# Patient Record
Sex: Male | Born: 1955
Health system: Southern US, Community
[De-identification: ages and names within clinical notes are randomized; demographics above are authoritative.]

## PROBLEM LIST (undated history)

## (undated) DIAGNOSIS — T7840XA Allergy, unspecified, initial encounter: Secondary | ICD-10-CM

## (undated) DIAGNOSIS — E785 Hyperlipidemia, unspecified: Secondary | ICD-10-CM

## (undated) DIAGNOSIS — I10 Essential (primary) hypertension: Secondary | ICD-10-CM

## (undated) DIAGNOSIS — E119 Type 2 diabetes mellitus without complications: Secondary | ICD-10-CM

## (undated) DIAGNOSIS — N529 Male erectile dysfunction, unspecified: Secondary | ICD-10-CM

## (undated) DIAGNOSIS — K219 Gastro-esophageal reflux disease without esophagitis: Secondary | ICD-10-CM

## (undated) DIAGNOSIS — J45909 Unspecified asthma, uncomplicated: Secondary | ICD-10-CM

## (undated) HISTORY — DX: Essential (primary) hypertension: I10

## (undated) HISTORY — PX: COLONOSCOPY: SHX174

## (undated) HISTORY — PX: HERNIA REPAIR: SHX51

## (undated) HISTORY — DX: Unspecified asthma, uncomplicated: J45.909

## (undated) HISTORY — DX: Allergy, unspecified, initial encounter: T78.40XA

## (undated) HISTORY — DX: Type 2 diabetes mellitus without complications: E11.9

## (undated) HISTORY — DX: Male erectile dysfunction, unspecified: N52.9

## (undated) HISTORY — DX: Hyperlipidemia, unspecified: E78.5

## (undated) HISTORY — DX: Gastro-esophageal reflux disease without esophagitis: K21.9

---

## 2009-06-02 ENCOUNTER — Emergency Department: Payer: Self-pay | Admitting: Emergency Medicine

## 2010-03-13 ENCOUNTER — Emergency Department: Payer: Self-pay | Admitting: Emergency Medicine

## 2010-03-20 ENCOUNTER — Emergency Department: Payer: Self-pay | Admitting: Emergency Medicine

## 2010-07-15 ENCOUNTER — Ambulatory Visit: Payer: Self-pay | Admitting: Surgery

## 2011-11-19 ENCOUNTER — Ambulatory Visit: Payer: Self-pay | Admitting: Emergency Medicine

## 2011-11-21 LAB — CBC WITH DIFFERENTIAL/PLATELET
Basophil #: 0 10*3/uL (ref 0.0–0.1)
Basophil %: 0.5 %
Eosinophil #: 0.1 10*3/uL (ref 0.0–0.7)
Eosinophil %: 1.3 %
HCT: 42.8 % (ref 40.0–52.0)
HGB: 14.3 g/dL (ref 13.0–18.0)
Lymphocyte #: 1.4 10*3/uL (ref 1.0–3.6)
Lymphocyte %: 15 %
MCH: 23.5 pg — ABNORMAL LOW (ref 26.0–34.0)
MCHC: 33.5 g/dL (ref 32.0–36.0)
MCV: 70 fL — ABNORMAL LOW (ref 80–100)
Monocyte #: 0.7 x10 3/mm (ref 0.2–1.0)
Monocyte %: 7.4 %
Neutrophil #: 7.1 10*3/uL — ABNORMAL HIGH (ref 1.4–6.5)
Neutrophil %: 75.8 %
Platelet: 173 10*3/uL (ref 150–440)
RBC: 6.09 10*6/uL — ABNORMAL HIGH (ref 4.40–5.90)
RDW: 15.7 % — ABNORMAL HIGH (ref 11.5–14.5)
WBC: 9.3 10*3/uL (ref 3.8–10.6)

## 2011-11-21 LAB — BASIC METABOLIC PANEL
Anion Gap: 8 (ref 7–16)
BUN: 11 mg/dL (ref 7–18)
Calcium, Total: 8.7 mg/dL (ref 8.5–10.1)
Chloride: 103 mmol/L (ref 98–107)
Co2: 26 mmol/L (ref 21–32)
Creatinine: 0.89 mg/dL (ref 0.60–1.30)
EGFR (African American): >60
EGFR (Non-African Amer.): >60
Glucose: 108 mg/dL — ABNORMAL HIGH (ref 65–99)
Osmolality: 274 (ref 275–301)
Potassium: 3.7 mmol/L (ref 3.5–5.1)
Sodium: 137 mmol/L (ref 136–145)

## 2013-05-26 ENCOUNTER — Encounter (INDEPENDENT_AMBULATORY_CARE_PROVIDER_SITE_OTHER): Payer: Self-pay | Admitting: Surgery

## 2013-05-30 ENCOUNTER — Ambulatory Visit (INDEPENDENT_AMBULATORY_CARE_PROVIDER_SITE_OTHER): Payer: Managed Care, Other (non HMO) | Admitting: Surgery

## 2013-05-30 ENCOUNTER — Encounter (INDEPENDENT_AMBULATORY_CARE_PROVIDER_SITE_OTHER): Payer: Self-pay | Admitting: Surgery

## 2013-05-30 VITALS — BP 162/104 | HR 84 | Temp 97.9°F | Resp 16 | Wt 197.4 lb

## 2013-05-30 DIAGNOSIS — K429 Umbilical hernia without obstruction or gangrene: Secondary | ICD-10-CM | POA: Insufficient documentation

## 2013-05-30 NOTE — Progress Notes (Signed)
Patient ID: Justin Frank, male   DOB: May 03, 1955, 58 y.o.   MRN: [id number]  Chief Complaint  Patient presents with  . New Evaluation    eval umb hernia    HPI Justin Frank is a 58 y.o. male.  Referred by Dr. Bernita Buffy for umbilical hernia  HPI This is a 58 year old male who is status post umbilical hernia repair with mesh in 2012. He experienced a recurrence quickly and had surgery again in 2013 at [facility]. We were able to obtain the operative report from 11/09/11. This was performed by Dr. Phylis Bougie who described using a ventralex Mesh. The patient reports that he is having more pain and bulging to the right of his incision.  He denies any obstructive symptoms. The only imaging he has had was an ultrasound which was not helpful. He requested evaluation in [place].   Past Medical History  Diagnosis Date  . Hypertension   . Asthma   . Hyperlipidemia   . ED (erectile dysfunction)     PSH - Umbilical hernia repair with mesh 1914 Umbilical hernia repair with mesh 2013 Shoulder surgery 2009  History reviewed. No pertinent family history.  Social History History  Substance Use Topics  . Smoking status: Former Research scientist (life sciences)  . Smokeless tobacco: Not on file  . Alcohol Use: No    Allergies  Allergen Reactions  . Lipitor [Atorvastatin]   . Lisinopril Cough    Current Outpatient Prescriptions  Medication Sig Dispense Refill  . Azelastine-Fluticasone (DYMISTA) 137-50 MCG/ACT SUSP Place into the nose.      Marland Kitchen azithromycin (ZITHROMAX) 250 MG tablet Take by mouth daily.      . cyclobenzaprine (FLEXERIL) 10 MG tablet Take 10 mg by mouth 3 (three) times daily as needed for muscle spasms.      . diclofenac (VOLTAREN) 75 MG EC tablet Take 75 mg by mouth 2 (two) times daily.      . Diclofenac Potassium 25 MG CAPS Take by mouth.      . levofloxacin (LEVAQUIN) 500 MG tablet Take 500 mg by mouth daily.      . meloxicam (MOBIC) 15 MG tablet Take 15 mg by mouth daily.      .  mometasone (NASONEX) 50 MCG/ACT nasal spray Place 2 sprays into the nose daily.      . mometasone-formoterol (DULERA) 100-5 MCG/ACT AERO Inhale 2 puffs into the lungs 2 (two) times daily.      . pantoprazole (PROTONIX) 40 MG injection Inject 40 mg into the vein.      . pantoprazole (PROTONIX) 40 MG tablet Take 40 mg by mouth daily.      . phentermine (ADIPEX-P) 37.5 MG tablet Take 37.5 mg by mouth daily before breakfast.      . predniSONE (DELTASONE) 10 MG tablet Take 10 mg by mouth daily with breakfast.      . rosuvastatin (CRESTOR) 20 MG tablet Take 20 mg by mouth daily.      . simvastatin (ZOCOR) 20 MG tablet Take 20 mg by mouth daily.      . tadalafil (CIALIS) 5 MG tablet Take 5 mg by mouth daily as needed for erectile dysfunction.       No current facility-administered medications for this visit.    Review of Systems Review of Systems  Constitutional: Negative for fever, chills and unexpected weight change.  HENT: Negative for congestion, hearing loss, sore throat, trouble swallowing and voice change.   Eyes: Negative for visual disturbance.  Respiratory: Negative for  cough and wheezing.   Cardiovascular: Negative for chest pain, palpitations and leg swelling.  Gastrointestinal: Positive for abdominal pain. Negative for nausea, vomiting, diarrhea, constipation, blood in stool, abdominal distention, anal bleeding and rectal pain.  Genitourinary: Negative for hematuria and difficulty urinating.  Musculoskeletal: Negative for arthralgias.  Skin: Negative for rash and wound.  Neurological: Negative for seizures, syncope, weakness and headaches.  Hematological: Negative for adenopathy. Does not bruise/bleed easily.  Psychiatric/Behavioral: Negative for confusion.    Blood pressure 162/104, pulse 84, temperature 97.9 F (36.6 C), temperature source Temporal, resp. rate 16, weight 197 lb 6.4 oz (89.54 kg).  Physical Exam Physical Exam WDWN in NAD HEENT:  EOMI, sclera anicteric Neck:   No masses, no thyromegaly Lungs:  CTA bilaterally; normal respiratory effort CV:  Regular rate and rhythm; no murmurs Abd:  +bowel sounds, soft, healed incision below umbilicus - slight bulge to the right of the incision with Valsalva maneuver Ext:  Well-perfused; no edema Skin:  Warm, dry; no sign of jaundice  Data Reviewed No imaging  Assessment    Recurrent umbilical hernia     Plan    Recommend changing the approach to the hernia repair.  Recommend laparoscopic hernia repair with mesh.  The surgical procedure has been discussed with the patient.  Potential risks, benefits, alternative treatments, and expected outcomes have been explained.  All of the patient's questions at this time have been answered.  The likelihood of reaching the patient's treatment goal is good.  The patient understand the proposed surgical procedure and wishes to proceed.         Shaunessy Dobratz K. 05/30/2013, 10:12 AM

## 2013-06-13 ENCOUNTER — Encounter (INDEPENDENT_AMBULATORY_CARE_PROVIDER_SITE_OTHER): Payer: Self-pay

## 2013-06-22 SURGERY — Surgical Case
Anesthesia: *Unknown

## 2013-06-27 ENCOUNTER — Other Ambulatory Visit (INDEPENDENT_AMBULATORY_CARE_PROVIDER_SITE_OTHER): Payer: Self-pay

## 2013-06-27 ENCOUNTER — Other Ambulatory Visit (INDEPENDENT_AMBULATORY_CARE_PROVIDER_SITE_OTHER): Payer: Self-pay | Admitting: Surgery

## 2013-06-27 DIAGNOSIS — K429 Umbilical hernia without obstruction or gangrene: Secondary | ICD-10-CM

## 2013-06-27 MED ORDER — OXYCODONE-ACETAMINOPHEN 5-325 MG PO TABS: 1.0000 | ORAL_TABLET | Freq: Four times a day (QID) | ORAL | Status: DC | PRN

## 2013-07-14 ENCOUNTER — Ambulatory Visit (INDEPENDENT_AMBULATORY_CARE_PROVIDER_SITE_OTHER): Payer: Managed Care, Other (non HMO) | Admitting: Surgery

## 2013-07-14 ENCOUNTER — Encounter (INDEPENDENT_AMBULATORY_CARE_PROVIDER_SITE_OTHER): Payer: Self-pay | Admitting: Surgery

## 2013-07-14 VITALS — BP 138/88 | HR 94 | Temp 97.4°F | Resp 14 | Ht 64.0 in | Wt 196.4 lb

## 2013-07-14 DIAGNOSIS — K429 Umbilical hernia without obstruction or gangrene: Secondary | ICD-10-CM

## 2013-07-14 MED ORDER — CYCLOBENZAPRINE HCL 5 MG PO TABS: 5.0000 mg | ORAL_TABLET | Freq: Three times a day (TID) | ORAL | Status: DC | PRN

## 2013-07-14 NOTE — Progress Notes (Signed)
Status post laparoscopic repair of a recurrent umbilical hernia with excision of the old mesh on 06/27/13. He is doing reasonably well. He has a lot of soreness around his sides. Most of this is located around his laparoscopic incisions. His umbilical region seems flat with no sign of hernia. All the incisions seem to be healing well with no sign of infection. The Dermabond is coming off. He is complaining of some back pain. He has used Flexeril in the past so I gave him another refill for this.  His job requires a lot of lifting so we will keep him out of work for a full 6 weeks. I gave him a note to return to work on 08/14/13. We will reevaluate him one more time prior to that visit.  Justin Frank. Georgette Dover, MD, [facility]  General/ Trauma Surgery  07/14/2013 10:25 AM

## 2013-08-11 ENCOUNTER — Encounter (INDEPENDENT_AMBULATORY_CARE_PROVIDER_SITE_OTHER): Payer: Self-pay | Admitting: Surgery

## 2013-08-11 ENCOUNTER — Ambulatory Visit (INDEPENDENT_AMBULATORY_CARE_PROVIDER_SITE_OTHER): Payer: Managed Care, Other (non HMO) | Admitting: Surgery

## 2013-08-11 VITALS — BP 120/79 | HR 81 | Temp 98.6°F | Resp 14 | Ht 64.0 in | Wt 199.0 lb

## 2013-08-11 DIAGNOSIS — K429 Umbilical hernia without obstruction or gangrene: Secondary | ICD-10-CM

## 2013-08-11 NOTE — Progress Notes (Signed)
The patient returns for evaluation of his recurrent umbilical hernia repair on 06/27/13. The soreness is improving. The swelling has dissipated. His incision is well-healed with no sign of infection. The laparoscopic port sites are also completely healed. No sign of recurrent hernia. He may return to work at full duty. Followup when necessary.  Imogene Burn. Georgette Dover, MD, [facility]  General/ Trauma Surgery  08/11/2013 11:54 AM

## 2013-10-09 ENCOUNTER — Telehealth (INDEPENDENT_AMBULATORY_CARE_PROVIDER_SITE_OTHER): Payer: Self-pay

## 2013-10-09 NOTE — Telephone Encounter (Signed)
Pt s/p recurrent umbilical hernia repair on 06/27/13 by Dr Georgette Dover. Pt states that he has started having some soreness at his incision site. Pt denies any heavy lifting, pushing, or pulling. Pt denies any fevers, chills, redness or swelling at the incison area. Advised pt to scale back on his daily activities and increase as tolerated. Pt advised that he can alternate Tylenol and Ibuprofen as needed for pain relief. Advised pt that he can place heat/ice as well on the area for pain relief. Pt would like to know if Dr Georgette Dover needs to see him in the office. Informed pt that I would send Dr Georgette Dover a message and we would contact him as soon as we received a message. Pt states that he will not have insurance until Sept 1.

## 2013-10-10 NOTE — Telephone Encounter (Signed)
If he continues to have symptoms after trying ibuprofen, I can see him back in the office.  If he wants to wait until after Sept 1, that would be fine.

## 2013-10-10 NOTE — Telephone Encounter (Signed)
Informed pt of Dr Vonna Kotyk message. Pt states that he will continue to take the Ibuprofen. He states that if symptoms continue or become worse he will call us back.

## 2014-05-02 ENCOUNTER — Emergency Department (HOSPITAL_BASED_OUTPATIENT_CLINIC_OR_DEPARTMENT_OTHER): Payer: Managed Care, Other (non HMO)

## 2014-05-02 ENCOUNTER — Emergency Department (HOSPITAL_BASED_OUTPATIENT_CLINIC_OR_DEPARTMENT_OTHER)
Admission: EM | Admit: 2014-05-02 | Discharge: 2014-05-02 | Disposition: A | Discharge status: Home / Self Care | Payer: Managed Care, Other (non HMO) | Attending: Emergency Medicine | Admitting: Emergency Medicine

## 2014-05-02 ENCOUNTER — Encounter (HOSPITAL_BASED_OUTPATIENT_CLINIC_OR_DEPARTMENT_OTHER): Payer: Self-pay | Admitting: *Deleted

## 2014-05-02 DIAGNOSIS — N529 Male erectile dysfunction, unspecified: Secondary | ICD-10-CM | POA: Insufficient documentation

## 2014-05-02 DIAGNOSIS — R079 Chest pain, unspecified: Secondary | ICD-10-CM

## 2014-05-02 DIAGNOSIS — R0981 Nasal congestion: Secondary | ICD-10-CM | POA: Diagnosis not present

## 2014-05-02 DIAGNOSIS — Z791 Long term (current) use of non-steroidal anti-inflammatories (NSAID): Secondary | ICD-10-CM | POA: Insufficient documentation

## 2014-05-02 DIAGNOSIS — R05 Cough: Secondary | ICD-10-CM | POA: Diagnosis not present

## 2014-05-02 DIAGNOSIS — I10 Essential (primary) hypertension: Secondary | ICD-10-CM | POA: Diagnosis not present

## 2014-05-02 DIAGNOSIS — Z79899 Other long term (current) drug therapy: Secondary | ICD-10-CM | POA: Insufficient documentation

## 2014-05-02 DIAGNOSIS — E785 Hyperlipidemia, unspecified: Secondary | ICD-10-CM | POA: Diagnosis not present

## 2014-05-02 DIAGNOSIS — Z7952 Long term (current) use of systemic steroids: Secondary | ICD-10-CM | POA: Diagnosis not present

## 2014-05-02 DIAGNOSIS — Z87891 Personal history of nicotine dependence: Secondary | ICD-10-CM | POA: Insufficient documentation

## 2014-05-02 DIAGNOSIS — J45909 Unspecified asthma, uncomplicated: Secondary | ICD-10-CM | POA: Diagnosis not present

## 2014-05-02 DIAGNOSIS — Z7951 Long term (current) use of inhaled steroids: Secondary | ICD-10-CM | POA: Insufficient documentation

## 2014-05-02 DIAGNOSIS — R0789 Other chest pain: Secondary | ICD-10-CM | POA: Diagnosis not present

## 2014-05-02 DIAGNOSIS — R000 Tachycardia, unspecified: Secondary | ICD-10-CM | POA: Insufficient documentation

## 2014-05-02 DIAGNOSIS — R059 Cough, unspecified: Secondary | ICD-10-CM

## 2014-05-02 LAB — CBC WITH DIFFERENTIAL/PLATELET
Basophils Absolute: 0 10*3/uL (ref 0.0–0.1)
Basophils Relative: 0 % (ref 0–1)
Eosinophils Absolute: 0.5 10*3/uL (ref 0.0–0.7)
Eosinophils Relative: 5 % (ref 0–5)
HCT: 42.4 % (ref 39.0–52.0)
Hemoglobin: 14.4 g/dL (ref 13.0–17.0)
Lymphocytes Relative: 29 % (ref 12–46)
Lymphs Abs: 2.6 10*3/uL (ref 0.7–4.0)
MCH: 22.6 pg — ABNORMAL LOW (ref 26.0–34.0)
MCHC: 34 g/dL (ref 30.0–36.0)
MCV: 66.7 fL — ABNORMAL LOW (ref 78.0–100.0)
Monocytes Absolute: 0.6 10*3/uL (ref 0.1–1.0)
Monocytes Relative: 7 % (ref 3–12)
Neutro Abs: 5.3 10*3/uL (ref 1.7–7.7)
Neutrophils Relative %: 59 % (ref 43–77)
Platelets: 231 10*3/uL (ref 150–400)
RBC: 6.36 MIL/uL — ABNORMAL HIGH (ref 4.22–5.81)
RDW: 16.4 % — ABNORMAL HIGH (ref 11.5–15.5)
WBC: 9 10*3/uL (ref 4.0–10.5)

## 2014-05-02 LAB — COMPREHENSIVE METABOLIC PANEL
ALT: 28 U/L (ref 0–53)
AST: 37 U/L (ref 0–37)
Albumin: 4 g/dL (ref 3.5–5.2)
Alkaline Phosphatase: 77 U/L (ref 39–117)
Anion gap: 3 — ABNORMAL LOW (ref 5–15)
BUN: 13 mg/dL (ref 6–23)
CO2: 28 mmol/L (ref 19–32)
Calcium: 8.7 mg/dL (ref 8.4–10.5)
Chloride: 105 mmol/L (ref 96–112)
Creatinine, Ser: 0.97 mg/dL (ref 0.50–1.35)
GFR calc Af Amer: >90 mL/min (ref >90)
GFR calc non Af Amer: >90 mL/min (ref >90)
Glucose, Bld: 116 mg/dL — ABNORMAL HIGH (ref 70–99)
Potassium: 3.7 mmol/L (ref 3.5–5.1)
Sodium: 136 mmol/L (ref 135–145)
Total Bilirubin: 0.4 mg/dL (ref 0.3–1.2)
Total Protein: 6.9 g/dL (ref 6.0–8.3)

## 2014-05-02 LAB — D-DIMER, QUANTITATIVE: D-Dimer, Quant: <0.27 ug/mL-FEU (ref 0.00–0.48)

## 2014-05-02 LAB — TROPONIN I: Troponin I: <0.03 ng/mL (ref <0.031)

## 2014-05-02 MED ORDER — SODIUM CHLORIDE 0.9 % IV BOLUS (SEPSIS)
1000.0000 mL | Freq: Once | INTRAVENOUS | Status: AC
Start: 2014-05-02 — End: 2014-05-02
  Administered 2014-05-02: 1000 mL via INTRAVENOUS

## 2014-05-02 MED ORDER — ASPIRIN 81 MG PO CHEW
324.0000 mg | CHEWABLE_TABLET | Freq: Once | ORAL | Status: AC
  Administered 2014-05-02: 324 mg via ORAL
  Filled 2014-05-02: qty 4

## 2014-05-02 MED ORDER — GUAIFENESIN-CODEINE 100-10 MG/5ML PO SOLN: 5.0000 mL | Freq: Three times a day (TID) | ORAL | Status: DC | PRN

## 2014-05-02 NOTE — ED Notes (Signed)
Pt reports that he has had cough and congestion x 3 weeks. Yesterday, started having a throbbing in the right side of his chest.

## 2014-05-02 NOTE — ED Provider Notes (Signed)
CSN: [id number]     Arrival date & time 05/02/14  1504 History   First MD Initiated Contact with Patient 05/02/14 1529     Chief Complaint  Patient presents with  . Cough     (Consider location/radiation/quality/duration/timing/severity/associated sxs/prior Treatment) HPI  59 year old male presents with right-sided chest pain since yesterday. He's been having a cough congestion for the past 3 weeks. The cough is a dry cough. The patient has not had any fevers or chills. No dyspnea. The cough is a "deep cough" and does not seem to be getting any better or particularly worse. Has been trying Sudafed for the congestion and cough with no relief. The patient states yesterday started noticing a throbbing and aching pain in his right anterior chest. It is worse after a bout of coughing and then seemed to gradually get better. Does not worsen with exertion or deep inspiration. Currently he has only a mild pain in his right chest. No left-sided chest pain. No weakness, numbness or arm pain. No neck pain. Patient is a history of hypertension and hyperlipidemia. No leg pain or leg swelling.  Past Medical History  Diagnosis Date  . Hypertension   . Asthma   . Hyperlipidemia   . ED (erectile dysfunction)    Past Surgical History  Procedure Laterality Date  . Hernia repair     No family history on file. History  Substance Use Topics  . Smoking status: Former Research scientist (life sciences)  . Smokeless tobacco: Not on file  . Alcohol Use: No    Review of Systems  Constitutional: Negative for fever and chills.  HENT: Positive for congestion.   Respiratory: Positive for cough. Negative for shortness of breath.   Cardiovascular: Positive for chest pain. Negative for leg swelling.  Gastrointestinal: Negative for vomiting and abdominal pain.  All other systems reviewed and are negative.     Allergies  Lipitor and Lisinopril  Home Medications   Prior to Admission medications   Medication Sig Start Date End Date  Taking? Authorizing Provider  azelastine (ASTELIN) 0.1 % nasal spray  06/20/13  Yes Historical Provider, MD  mometasone (NASONEX) 50 MCG/ACT nasal spray Place 2 sprays into the nose daily.   Yes Historical Provider, MD  mometasone-formoterol (DULERA) 100-5 MCG/ACT AERO Inhale 2 puffs into the lungs 2 (two) times daily.   Yes Historical Provider, MD  Nebivolol HCl (BYSTOLIC PO) Take by mouth.   Yes Historical Provider, MD  rosuvastatin (CRESTOR) 20 MG tablet Take 20 mg by mouth daily.   Yes Historical Provider, MD  tadalafil (CIALIS) 5 MG tablet Take 5 mg by mouth daily as needed for erectile dysfunction.   Yes Historical Provider, MD  Azelastine-Fluticasone (DYMISTA) 137-50 MCG/ACT SUSP Place into the nose.    Historical Provider, MD  cyclobenzaprine (FLEXERIL) 10 MG tablet Take 10 mg by mouth 3 (three) times daily as needed for muscle spasms.    Historical Provider, MD  cyclobenzaprine (FLEXERIL) 5 MG tablet Take 1 tablet (5 mg total) by mouth every 8 (eight) hours as needed for muscle spasms. 07/14/13 07/14/14  Donnie Mesa, MD  diclofenac (VOLTAREN) 75 MG EC tablet Take 75 mg by mouth 2 (two) times daily.    Historical Provider, MD  Diclofenac Potassium 25 MG CAPS Take by mouth.    Historical Provider, MD  lisinopril-hydrochlorothiazide Reita May) 20-12.5 MG per tablet  07/25/13   Historical Provider, MD  meloxicam (MOBIC) 15 MG tablet Take 15 mg by mouth daily.    Historical Provider, MD  pantoprazole (  PROTONIX) 40 MG injection Inject 40 mg into the vein.    Historical Provider, MD  pantoprazole (PROTONIX) 40 MG tablet Take 40 mg by mouth daily.    Historical Provider, MD  phentermine (ADIPEX-P) 37.5 MG tablet Take 37.5 mg by mouth daily before breakfast.    Historical Provider, MD  predniSONE (DELTASONE) 10 MG tablet Take 10 mg by mouth daily with breakfast.    Historical Provider, MD  simvastatin (ZOCOR) 20 MG tablet Take 20 mg by mouth daily.    Historical Provider, MD   BP 171/93  mmHg  Pulse 104  Temp(Src) 97.7 F (36.5 C) (Oral)  Resp 18  Ht 5\' 4"  (1.626 m)  Wt 201 lb (91.173 kg)  BMI 34.48 kg/m2  SpO2 98% Physical Exam  Constitutional: He is oriented to person, place, and time. He appears well-developed and well-nourished.  HENT:  Head: Normocephalic and atraumatic.  Right Ear: External ear normal.  Left Ear: External ear normal.  Nose: Nose normal.  Eyes: Right eye exhibits no discharge. Left eye exhibits no discharge.  Neck: Neck supple.  Cardiovascular: Normal rate, regular rhythm, normal heart sounds and intact distal pulses.   Pulmonary/Chest: Effort normal. He has no wheezes. He has no rales. He exhibits tenderness (mild right anterior chest wall tenderness).  Abdominal: Soft. He exhibits no distension. There is no tenderness.  Musculoskeletal: He exhibits no edema.  Neurological: He is alert and oriented to person, place, and time.  Skin: Skin is warm and dry.  Nursing note and vitals reviewed.   ED Course  Procedures (including critical care time) Labs Review Labs Reviewed  COMPREHENSIVE METABOLIC PANEL - Abnormal; Notable for the following:    Glucose, Bld 116 (*)    Anion gap 3 (*)    All other components within normal limits  CBC WITH DIFFERENTIAL/PLATELET - Abnormal; Notable for the following:    RBC 6.36 (*)    MCV 66.7 (*)    MCH 22.6 (*)    RDW 16.4 (*)    All other components within normal limits  TROPONIN I  D-DIMER, QUANTITATIVE    Imaging Review Dg Chest 2 View  05/02/2014   CLINICAL DATA:  Cough, congestion for 3 weeks.  Chest pain.  EXAM: CHEST  2 VIEW  COMPARISON:  None.  FINDINGS: The heart size and mediastinal contours are within normal limits. Both lungs are clear. The visualized skeletal structures are unremarkable.  IMPRESSION: No active cardiopulmonary disease.   Electronically Signed   By: Kathreen Devoid   On: 05/02/2014 15:36     EKG Interpretation   Date/Time:  Wednesday May 02 2014 15:13:04 EST Ventricular  Rate:  103 PR Interval:  140 QRS Duration: 78 QT Interval:  352 QTC Calculation: 461 R Axis:   24 Text Interpretation:  Sinus tachycardia ST \\T \ T wave abnormality,  consider inferolateral ischemia Abnormal ECG No old tracing to compare  Confirmed by Tyr Franca  MD, Hawkin Charo ([id number]) on 05/02/2014 3:30:18 PM      MDM   Final diagnoses:  Cough  Chest pain, unspecified chest pain type    Patient's EKG is abnormal, but otherwise workup negative. Besides tachycardia, he has no risk factors for PE and with negative Ddimer I believe no further workup is indicated. It appears to be an MSK issue due to his coughing x 3 weeks. Given the atypical nature, right sided pain and reproducible symptoms, I feel this is highly unlikely to be ACS. He has no old EKGs but his EKG  does show ST/T changes. D/w Dr. Marlou Porch of cardiology, who has reviewed EKG and feels this is nonspecific and likely is more related to hypertension. Given his symptoms started over 24 hours ago, I feel one troponin is sufficient to r/o MI. Dr. Marlou Porch will arrange outpatient f/u with cards and get echo.    Ephraim Hamburger, MD 05/02/14 [phone]

## 2014-05-02 NOTE — Discharge Instructions (Signed)
°Chest Pain (Nonspecific) °It is often hard to give a specific diagnosis for the cause of chest pain. There is always a chance that your pain could be related to something serious, such as a heart attack or a blood clot in the lungs. You need to follow up with your health care provider for further evaluation. °CAUSES  °· Heartburn. °· Pneumonia or bronchitis. °· Anxiety or stress. °· Inflammation around your heart (pericarditis) or lung (pleuritis or pleurisy). °· A blood clot in the lung. °· A collapsed lung (pneumothorax). It can develop suddenly on its own (spontaneous pneumothorax) or from trauma to the chest. °· Shingles infection (herpes zoster virus). °The chest wall is composed of bones, muscles, and cartilage. Any of these can be the source of the pain. °· The bones can be bruised by injury. °· The muscles or cartilage can be strained by coughing or overwork. °· The cartilage can be affected by inflammation and become sore (costochondritis). °DIAGNOSIS  °Lab tests or other studies may be needed to find the cause of your pain. Your health care provider may have you take a test called an ambulatory electrocardiogram (ECG). An ECG records your heartbeat patterns over a 24-hour period. You may also have other tests, such as: °· Transthoracic echocardiogram (TTE). During echocardiography, sound waves are used to evaluate how blood flows through your heart. °· Transesophageal echocardiogram (TEE). °· Cardiac monitoring. This allows your health care provider to monitor your heart rate and rhythm in real time. °· Holter monitor. This is a portable device that records your heartbeat and can help diagnose heart arrhythmias. It allows your health care provider to track your heart activity for several days, if needed. °· Stress tests by exercise or by giving medicine that makes the heart beat faster. °TREATMENT  °· Treatment depends on what may be causing your chest pain. Treatment may include: °¨ Acid blockers for  heartburn. °¨ Anti-inflammatory medicine. °¨ Pain medicine for inflammatory conditions. °¨ Antibiotics if an infection is present. °· You may be advised to change lifestyle habits. This includes stopping smoking and avoiding alcohol, caffeine, and chocolate. °· You may be advised to keep your head raised (elevated) when sleeping. This reduces the chance of acid going backward from your stomach into your esophagus. °Most of the time, nonspecific chest pain will improve within 2-3 days with rest and mild pain medicine.  °HOME CARE INSTRUCTIONS  °· If antibiotics were prescribed, take them as directed. Finish them even if you start to feel better. °· For the next few days, avoid physical activities that bring on chest pain. Continue physical activities as directed. °· Do not use any tobacco products, including cigarettes, chewing tobacco, or electronic cigarettes. °· Avoid drinking alcohol. °· Only take medicine as directed by your health care provider. °· Follow your health care provider's suggestions for further testing if your chest pain does not go away. °· Keep any follow-up appointments you made. If you do not go to an appointment, you could develop lasting (chronic) problems with pain. If there is any problem keeping an appointment, call to reschedule. °SEEK MEDICAL CARE IF:  °· Your chest pain does not go away, even after treatment. °· You have a rash with blisters on your chest. °· You have a fever. °SEEK IMMEDIATE MEDICAL CARE IF:  °· You have increased chest pain or pain that spreads to your arm, neck, jaw, back, or abdomen. °· You have shortness of breath. °· You have an increasing cough, or you cough   up blood. °· You have severe back or abdominal pain. °· You feel nauseous or vomit. °· You have severe weakness. °· You faint. °· You have chills. °This is an emergency. Do not wait to see if the pain will go away. Get medical help at once. Call your local emergency services (911 in [place] Do not drive  yourself to the hospital. °MAKE SURE YOU:  °· Understand these instructions. °· Will watch your condition. °· Will get help right away if you are not doing well or get worse. °Document Released: 11/26/2004 Document Revised: 02/21/2013 Document Reviewed: 09/22/2007 °Exit[employer]® Patient Information ©2015 [employer]. This information is not intended to replace advice given to you by your health care provider. Make sure you discuss any questions you have with your health care provider. °Cough, Adult ° A cough is a reflex that helps clear your throat and airways. It can help heal the body or may be a reaction to an irritated airway. A cough may only last 2 or 3 weeks (acute) or may last more than 8 weeks (chronic).  °CAUSES °Acute cough: °· Viral or bacterial infections. °Chronic cough: °· Infections. °· Allergies. °· Asthma. °· Post-nasal drip. °· Smoking. °· Heartburn or acid reflux. °· Some medicines. °· Chronic lung problems (COPD). °· Cancer. °SYMPTOMS  °· Cough. °· Fever. °· Chest pain. °· Increased breathing rate. °· High-pitched whistling sound when breathing (wheezing). °· Colored mucus that you cough up (sputum). °TREATMENT  °· A bacterial cough may be treated with antibiotic medicine. °· A viral cough must run its course and will not respond to antibiotics. °· Your caregiver may recommend other treatments if you have a chronic cough. °HOME CARE INSTRUCTIONS  °· Only take over-the-counter or prescription medicines for pain, discomfort, or fever as directed by your caregiver. Use cough suppressants only as directed by your caregiver. °· Use a cold steam vaporizer or humidifier in your bedroom or home to help loosen secretions. °· Sleep in a semi-upright position if your cough is worse at night. °· Rest as needed. °· Stop smoking if you smoke. °SEEK IMMEDIATE MEDICAL CARE IF:  °· You have pus in your sputum. °· Your cough starts to worsen. °· You cannot control your cough with suppressants and are losing  sleep. °· You begin coughing up blood. °· You have difficulty breathing. °· You develop pain which is getting worse or is uncontrolled with medicine. °· You have a fever. °MAKE SURE YOU:  °· Understand these instructions. °· Will watch your condition. °· Will get help right away if you are not doing well or get worse. °Document Released: 08/15/2010 Document Revised: 05/11/2011 Document Reviewed: 08/15/2010 °ExitCare® Patient Information ©2015 [employer]. This information is not intended to replace advice given to you by your health care provider. Make sure you discuss any questions you have with your health care provider. ° °

## 2014-05-15 ENCOUNTER — Ambulatory Visit (HOSPITAL_COMMUNITY): Payer: Managed Care, Other (non HMO) | Attending: Cardiology

## 2014-05-15 ENCOUNTER — Encounter: Payer: Self-pay | Admitting: Cardiology

## 2014-05-15 ENCOUNTER — Ambulatory Visit (INDEPENDENT_AMBULATORY_CARE_PROVIDER_SITE_OTHER): Payer: Managed Care, Other (non HMO) | Admitting: Cardiology

## 2014-05-15 VITALS — BP 153/92 | HR 78 | Ht 64.0 in | Wt 202.0 lb

## 2014-05-15 DIAGNOSIS — I10 Essential (primary) hypertension: Secondary | ICD-10-CM

## 2014-05-15 DIAGNOSIS — R0789 Other chest pain: Secondary | ICD-10-CM

## 2014-05-15 DIAGNOSIS — R9431 Abnormal electrocardiogram [ECG] [EKG]: Secondary | ICD-10-CM | POA: Diagnosis not present

## 2014-05-15 DIAGNOSIS — E785 Hyperlipidemia, unspecified: Secondary | ICD-10-CM

## 2014-05-15 DIAGNOSIS — E1169 Type 2 diabetes mellitus with other specified complication: Secondary | ICD-10-CM | POA: Insufficient documentation

## 2014-05-15 NOTE — Patient Instructions (Signed)
The current medical regimen is effective;  continue present plan and medications.  Your physician has requested that you have an echocardiogram. Echocardiography is a painless test that uses sound waves to create images of your heart. It provides your doctor with information about the size and shape of your heart and how well your heart's chambers and valves are working. This procedure takes approximately one hour. There are no restrictions for this procedure.  Follow up as needed.  Thank you for choosing [facility]!!

## 2014-05-15 NOTE — Progress Notes (Signed)
2D Echo completed. 05/15/2014 

## 2014-05-15 NOTE — Progress Notes (Signed)
Cardiology Office Note   Date:  05/15/2014   ID:  Justin Frank, DOB 1955-09-10, MRN [id number]  PCP:  Lorelee Market, MD  Cardiologist:   Candee Furbish, MD   Chief Complaint  Patient presents with  . Advice Only      History of Present Illness: Justin Frank is a 59 y.o. male who presents for evaluation of chest pain. He was seen in the emergency department on 05/02/14 with chief complaint of cough over the past 3 weeks, dry, deep with throbbing and aching in his right anterior chest wall. No associated weakness, numbness, arm pain. History of hypertension, hyperlipidemia.  Heavy heavy deep cough. Dry. Slightly improved. Not hurting as much.   ECG showed T wave changes.   35 years ago smoked. Once cough passed out when holding head down.   No syncope, no SOB. Works as a Biomedical scientist. Once a while can feel a little faint or dizzy. Last year had vertigo.    Past Medical History  Diagnosis Date  . Hypertension   . Asthma   . Hyperlipidemia   . ED (erectile dysfunction)     Past Surgical History  Procedure Laterality Date  . Hernia repair       Current Outpatient Prescriptions  Medication Sig Dispense Refill  . cyclobenzaprine (FLEXERIL) 10 MG tablet Take 10 mg by mouth 3 (three) times daily as needed for muscle spasms.    . mometasone-formoterol (DULERA) 100-5 MCG/ACT AERO Inhale 2 puffs into the lungs 2 (two) times daily.    . Nebivolol HCl (BYSTOLIC PO) Take by mouth.    . rosuvastatin (CRESTOR) 20 MG tablet Take 20 mg by mouth daily.    Marland Kitchen guaiFENesin-codeine 100-10 MG/5ML syrup Take 5 mLs by mouth 3 (three) times daily as needed for cough. (Patient not taking: Reported on 05/15/2014) 120 mL 0  . tadalafil (CIALIS) 5 MG tablet Take 5 mg by mouth daily as needed for erectile dysfunction.     No current facility-administered medications for this visit.    Allergies:   Lipitor and Lisinopril    Social History:  The patient  reports that he has quit smoking.  He does not have any smokeless tobacco history on file. He reports that he does not drink alcohol or use illicit drugs.   Family History:  The patient's no early history of CAD   ROS:  Please see the history of present illness.   Otherwise, review of systems are positive for none.   All other systems are reviewed and negative.    PHYSICAL EXAM: VS:  BP 153/92 mmHg  Pulse 78  Ht 5\' 4"  (1.626 m)  Wt 202 lb (91.627 kg)  BMI 34.66 kg/m2 , BMI Body mass index is 34.66 kg/(m^2). GEN: Well nourished, well developed, in no acute distress HEENT: normal Neck: no JVD, carotid bruits, or masses Cardiac: RRR; no murmurs, rubs, or gallops,no edema  Respiratory:  clear to auscultation bilaterally, normal work of breathing GI: soft, nontender, nondistended, + BS MS: no deformity or atrophy Skin: warm and dry, no rash Neuro:  Strength and sensation are intact Psych: euthymic mood, full affect   EKG:  05/02/14 shows sinus rhythm with nonspecific T-wave changes, consider lateral ischemia. This could also be manifestation of left ventricular hypertrophy   Recent Labs: 05/02/2014: ALT 28; BUN 13; Creatinine 0.97; Hemoglobin 14.4; Platelets 231; Potassium 3.7; Sodium 136    Lipid Panel No results found for: CHOL, TRIG, HDL, CHOLHDL, VLDL, LDLCALC, LDLDIRECT  Wt Readings from Last 3 Encounters:  05/15/14 202 lb (91.627 kg)  05/02/14 201 lb (91.173 kg)  08/11/13 199 lb (90.266 kg)      Other studies Reviewed: Additional studies/ records that were reviewed today include: ER visit, lab work, chest x-ray, troponin. Review of the above records demonstrates: Unremarkable workup   ASSESSMENT AND PLAN:  1.  Atypical chest pain-most likely musculoskeletal from deep, aggravating cough. Chest x-ray is unremarkable. Continuing with cough therapy. If after cough is relieved he feels exertional chest discomfort, he will let me know for further evaluation. Now, I will check an echocardiogram to ensure  proper structure and function of the heart in light of his abnormal EKG.  2. Abnormal EKG-check echocardiogram to exclude hypertrophic morphology.  3. Essential hypertension-continue with current drug regimen. Elevated today. Continue to monitor. May need further agents.  4. Hyperlipidemia-continue with Crestor.   Current medicines are reviewed at length with the patient today.  The patient does not have concerns regarding medicines.  The following changes have been made:  no change  Labs/ tests ordered today include:  No orders of the defined types were placed in this encounter.     Disposition:   FU with echo. PRN otherwise   Signed, Candee Furbish, MD  05/15/2014 2:25 PM    [facility] [address], [place], [place]  [address] Phone: [phone]; Fax: [phone]

## 2014-05-17 ENCOUNTER — Telehealth: Payer: Self-pay | Admitting: Cardiology

## 2014-05-17 NOTE — Telephone Encounter (Signed)
Follow up ° ° ° ° ° ° ° ° ° °Pt returning nurse call  °

## 2014-05-17 NOTE — Telephone Encounter (Signed)
Reviewed results of echo with pt who states understanding. 

## 2014-05-17 NOTE — Telephone Encounter (Signed)
New Msg        Pt is returning call.  Please call back.

## 2014-06-19 NOTE — Consult Note (Signed)
PATIENT NAME:  Justin Frank, Justin Frank MR#:  [id number] DATE OF BIRTH:  [birth year]  DATE OF CONSULTATION:  11/20/2011  REFERRING PHYSICIAN:  Dr. Phylis Bougie CONSULTING PHYSICIAN:  Demetrios Loll, MD  PRIMARY CARE PHYSICIAN:  Dr. Brunetta Genera.   REASON FOR CONSULTATION:  High blood pressure.  HISTORY: The patient is a 59 year old African American male with a history of hyperlipidemia,  recurrent hernia, who was admitted for removal of old mesh and insertion of new mesh and repair of ventral hernia. The patient's blood pressure was high, about 160 to 170 today. Dr. Phylis Bougie requested consult to control blood pressure. The patient denies any symptoms except abdomen sore and constipation.   PAST MEDICAL HISTORY:  1. Hyperlipidemia. 2. Recurrent hernia.   PAST SURGICAL HISTORY:  1. Shoulder surgery. 2. Hernia surgery.   FAMILY HISTORY: Parents died, had a history of hypertension.  MEDICATION: Zocor 40 mg p.o. daily.  REVIEW OF SYSTEMS: CONSTITUTIONAL: The patient denies any fever or chills. No headache or dizziness. No weakness or weight loss. EYES: No double vision or blurred vision. ENT: No postnasal drip, epistaxis, slurred speech or dysphagia. CARDIOVASCULAR: No chest pain, palpitation, orthopnea, or nocturnal dyspnea. No leg edema. PULMONARY: No cough, sputum, shortness of breath, or hematemesis. GASTROINTESTINAL: No abdominal pain, nausea, vomiting, or diarrhea, but has constipation. GU: No dysuria, hematuria, or incontinence. SKIN: No rash or jaundice. NEUROLOGY: No syncope, loss of consciousness or seizure. HEMATOLOGY: No easy bruising or bleeding. MUSCULOSKELETAL: No joint pain. No edema.   PHYSICAL EXAMINATION:  VITALS: Temperature 99.7, blood pressure 168/100, pulse 88, and oxygen saturation on room air 94%.   GENERAL: The patient is alert, awake, oriented in no acute distress.   HEENT: Pupils round, equal, reactive to light and accommodation. Moist oral mucosa. Clear oropharynx.   NECK: Supple. No  JVD or carotid bruits. No lymphadenopathy. No thyromegaly.   CARDIOVASCULAR: S1, S2, regular rate and rhythm. No murmurs or gallops.   PULMONARY: No wheezing or rales. No use of accessory muscles to breathe.   ABDOMEN: Soft. Tenderness but no distention. Weak bowel sounds. It is difficult to estimate whether with the patient has organomegaly due to abdominal tenderness on palpation.   EXTREMITIES: No edema, clubbing, or cyanosis. No calf tenderness. Strong bilateral pedal pulses.   SKIN: No rash or jaundice.   NEUROLOGIC: Alert and oriented x3. No focal deficits. Power five out of five. Sensation intact.   IMPRESSION:  1. Hypertension. 2. Hyperlipidemia. 3. Recurrent hernia.   RECOMMENDATIONS:  1. The patient is status post surgery needing pain control.  2. The patient's blood pressure is not very well controlled. We will add Lopressor 12.5 mg p.o. b.i.d. and may adjust the dose depending on the patient's blood pressure.  3. We will check CBC and BMP.  4. Gastrointestinal and deep vein thrombosis prophylaxis.   Thank you for the consult. Discussed the patient's situation and recommendations with the patient and the patient's wife.   TIME SPENT: About 50 minutes.    ____________________________ Demetrios Loll, MD qc:ap D: 11/20/2011 22:04:05 ET T: 11/21/2011 11:35:17 ET JOB#: [id number]  cc: Demetrios Loll, MD, <Dictator> Meindert A. Brunetta Genera, MD Demetrios Loll MD ELECTRONICALLY SIGNED 11/23/2011 12:43

## 2014-06-19 NOTE — Op Note (Signed)
PATIENT NAME:  Justin Frank, Justin Frank MR#:  [id number] DATE OF BIRTH:  1955/06/11  DATE OF PROCEDURE:  11/19/2011  PREOPERATIVE DIAGNOSIS: Recurrent ventral hernia.   POSTOPERATIVE DIAGNOSIS: Recurrent ventral hernia.   PROCEDURES:  1. Removal of old mesh.  2. Insertion of new mesh and repair of ventral hernia.   INDICATIONS: This is a patient who has been coughing for the last month. He said that due to his coughing he was having pain around the bellybutton and I could feel a lump there which was very painful. It looked like part of his mesh kind of came out under the skin area.   DESCRIPTION OF PROCEDURE: Patient was then brought to surgery under general anesthesia. The abdomen was then prepped and draped. A small incision was made below the belly button. After cutting skin and subcutaneous tissue, a lot of adhesions which were then released and the mesh was then exposed. Mesh looked like to be extraperitoneally but it was removed completely and by entering into the abdomen and put my finger in and looked everywhere all the adhesions were released. I put another Ventralex mesh in there and then sutured it to the fascia and made sure the edges were not buckled and sutured to the fascia with interrupted Surgilon sutures. Good repair was then performed and after that skin was then closed with subcuticular closure with 4-0 Vicryl and Steri-Strips applied. Patient tolerated procedure well, sent to the recovery room in satisfactory condition.    ____________________________ Welford Roche Phylis Bougie, MD msh:cms D: 11/19/2011 10:21:52 ET T: 11/19/2011 14:10:34 ET JOB#: [id number]  cc: Lorin Gawron S. Phylis Bougie, MD, <Dictator> Meindert A. Brunetta Genera, MD  Sharene Butters MD ELECTRONICALLY SIGNED 11/24/2011 15:35

## 2014-12-04 ENCOUNTER — Other Ambulatory Visit: Payer: Self-pay | Admitting: Family

## 2014-12-04 ENCOUNTER — Other Ambulatory Visit: Payer: Self-pay | Admitting: Internal Medicine

## 2014-12-04 DIAGNOSIS — R109 Unspecified abdominal pain: Secondary | ICD-10-CM

## 2014-12-11 ENCOUNTER — Ambulatory Visit
Admission: RE | Admit: 2014-12-11 | Discharge: 2014-12-11 | Disposition: A | Discharge status: Home / Self Care | Payer: Managed Care, Other (non HMO) | Source: Ambulatory Visit | Attending: Family | Admitting: Family

## 2014-12-11 ENCOUNTER — Other Ambulatory Visit: Payer: Self-pay | Admitting: Family

## 2014-12-11 ENCOUNTER — Encounter (INDEPENDENT_AMBULATORY_CARE_PROVIDER_SITE_OTHER): Payer: Self-pay

## 2014-12-11 DIAGNOSIS — R109 Unspecified abdominal pain: Secondary | ICD-10-CM

## 2015-03-19 ENCOUNTER — Other Ambulatory Visit: Payer: Self-pay | Admitting: Surgery

## 2015-03-19 DIAGNOSIS — G8929 Other chronic pain: Secondary | ICD-10-CM

## 2015-03-19 DIAGNOSIS — R1033 Periumbilical pain: Principal | ICD-10-CM

## 2015-03-28 ENCOUNTER — Ambulatory Visit
Admission: RE | Admit: 2015-03-28 | Discharge: 2015-03-28 | Disposition: A | Discharge status: Home / Self Care | Payer: Managed Care, Other (non HMO) | Source: Ambulatory Visit | Attending: Surgery | Admitting: Surgery

## 2015-03-28 DIAGNOSIS — R1033 Periumbilical pain: Principal | ICD-10-CM

## 2015-03-28 DIAGNOSIS — G8929 Other chronic pain: Secondary | ICD-10-CM

## 2015-03-28 MED ORDER — IOPAMIDOL (ISOVUE-300) INJECTION 61%
125.0000 mL | Freq: Once | INTRAVENOUS | Status: AC | PRN
  Administered 2015-03-28: 125 mL via INTRAVENOUS

## 2016-06-05 HISTORY — PX: COLONOSCOPY: SHX174

## 2016-06-05 LAB — HM COLONOSCOPY

## 2017-01-11 ENCOUNTER — Emergency Department (HOSPITAL_BASED_OUTPATIENT_CLINIC_OR_DEPARTMENT_OTHER): Payer: Managed Care, Other (non HMO)

## 2017-01-11 ENCOUNTER — Other Ambulatory Visit: Payer: Self-pay

## 2017-01-11 ENCOUNTER — Encounter (HOSPITAL_BASED_OUTPATIENT_CLINIC_OR_DEPARTMENT_OTHER): Payer: Self-pay | Admitting: Emergency Medicine

## 2017-01-11 ENCOUNTER — Emergency Department (HOSPITAL_BASED_OUTPATIENT_CLINIC_OR_DEPARTMENT_OTHER)
Admission: EM | Admit: 2017-01-11 | Discharge: 2017-01-11 | Disposition: A | Discharge status: Home / Self Care | Payer: Managed Care, Other (non HMO) | Attending: Emergency Medicine | Admitting: Emergency Medicine

## 2017-01-11 DIAGNOSIS — I10 Essential (primary) hypertension: Secondary | ICD-10-CM | POA: Diagnosis not present

## 2017-01-11 DIAGNOSIS — Z87891 Personal history of nicotine dependence: Secondary | ICD-10-CM | POA: Insufficient documentation

## 2017-01-11 DIAGNOSIS — Z79899 Other long term (current) drug therapy: Secondary | ICD-10-CM | POA: Insufficient documentation

## 2017-01-11 DIAGNOSIS — R059 Cough, unspecified: Secondary | ICD-10-CM

## 2017-01-11 DIAGNOSIS — J069 Acute upper respiratory infection, unspecified: Secondary | ICD-10-CM | POA: Diagnosis not present

## 2017-01-11 DIAGNOSIS — J45909 Unspecified asthma, uncomplicated: Secondary | ICD-10-CM | POA: Diagnosis not present

## 2017-01-11 DIAGNOSIS — M545 Low back pain, unspecified: Secondary | ICD-10-CM

## 2017-01-11 DIAGNOSIS — R05 Cough: Secondary | ICD-10-CM

## 2017-01-11 LAB — CBC WITH DIFFERENTIAL/PLATELET
Basophils Absolute: 0 10*3/uL (ref 0.0–0.1)
Basophils Relative: 0 %
Eosinophils Absolute: 0.6 10*3/uL (ref 0.0–0.7)
Eosinophils Relative: 7 %
HCT: 39.3 % (ref 39.0–52.0)
Hemoglobin: 13.4 g/dL (ref 13.0–17.0)
Lymphocytes Relative: 11 %
Lymphs Abs: 0.9 10*3/uL (ref 0.7–4.0)
MCH: 22.2 pg — ABNORMAL LOW (ref 26.0–34.0)
MCHC: 34.1 g/dL (ref 30.0–36.0)
MCV: 65.2 fL — ABNORMAL LOW (ref 78.0–100.0)
Monocytes Absolute: 0.8 10*3/uL (ref 0.1–1.0)
Monocytes Relative: 10 %
Neutro Abs: 6.1 10*3/uL (ref 1.7–7.7)
Neutrophils Relative %: 72 %
Platelets: 183 10*3/uL (ref 150–400)
RBC: 6.03 MIL/uL — ABNORMAL HIGH (ref 4.22–5.81)
RDW: 16 % — ABNORMAL HIGH (ref 11.5–15.5)
WBC: 8.4 10*3/uL (ref 4.0–10.5)

## 2017-01-11 LAB — URINALYSIS, ROUTINE W REFLEX MICROSCOPIC
Bilirubin Urine: NEGATIVE
Glucose, UA: NEGATIVE mg/dL
Hgb urine dipstick: NEGATIVE
Ketones, ur: NEGATIVE mg/dL
Leukocytes, UA: NEGATIVE
Nitrite: NEGATIVE
Protein, ur: NEGATIVE mg/dL
Specific Gravity, Urine: 1.015 (ref 1.005–1.030)
pH: 7 (ref 5.0–8.0)

## 2017-01-11 LAB — LIPASE, BLOOD: Lipase: 32 U/L (ref 11–51)

## 2017-01-11 LAB — COMPREHENSIVE METABOLIC PANEL
ALT: 25 U/L (ref 17–63)
AST: 25 U/L (ref 15–41)
Albumin: 4 g/dL (ref 3.5–5.0)
Alkaline Phosphatase: 90 U/L (ref 38–126)
Anion gap: 7 (ref 5–15)
BUN: 18 mg/dL (ref 6–20)
CO2: 26 mmol/L (ref 22–32)
Calcium: 8.9 mg/dL (ref 8.9–10.3)
Chloride: 105 mmol/L (ref 101–111)
Creatinine, Ser: 0.99 mg/dL (ref 0.61–1.24)
GFR calc Af Amer: >60 mL/min (ref >60)
GFR calc non Af Amer: >60 mL/min (ref >60)
Glucose, Bld: 105 mg/dL — ABNORMAL HIGH (ref 65–99)
Potassium: 3.6 mmol/L (ref 3.5–5.1)
Sodium: 138 mmol/L (ref 135–145)
Total Bilirubin: 0.7 mg/dL (ref 0.3–1.2)
Total Protein: 7.1 g/dL (ref 6.5–8.1)

## 2017-01-11 MED ORDER — OXYCODONE-ACETAMINOPHEN 5-325 MG PO TABS
1.0000 | ORAL_TABLET | Freq: Once | ORAL | Status: AC
  Administered 2017-01-11: 1 via ORAL
  Filled 2017-01-11: qty 1

## 2017-01-11 MED ORDER — CYCLOBENZAPRINE HCL 10 MG PO TABS: 10.0000 mg | ORAL_TABLET | Freq: Two times a day (BID) | ORAL | 0 refills | Status: DC | PRN

## 2017-01-11 MED ORDER — CYCLOBENZAPRINE HCL 10 MG PO TABS
10.0000 mg | ORAL_TABLET | Freq: Once | ORAL | Status: AC
  Administered 2017-01-11: 10 mg via ORAL
  Filled 2017-01-11: qty 1

## 2017-01-11 MED ORDER — OXYCODONE-ACETAMINOPHEN 5-325 MG PO TABS: 1.0000 | ORAL_TABLET | ORAL | 0 refills | Status: DC | PRN

## 2017-01-11 MED ORDER — BENZONATATE 100 MG PO CAPS: 100.0000 mg | ORAL_CAPSULE | Freq: Three times a day (TID) | ORAL | 0 refills | Status: DC

## 2017-01-11 MED FILL — CYCLOBENZAPRINE 10 MG TAB: 10 | 10 days supply | Qty: 20 | Fill #0

## 2017-01-11 MED FILL — OXYCODONE-ACETAMINOPHEN 5-3: 5-325 | 2 days supply | Qty: 12 | Fill #0

## 2017-01-11 MED FILL — BENZONATATE 100 MG CAPSULE: 100 | 7 days supply | Qty: 21 | Fill #0

## 2017-01-11 NOTE — ED Notes (Signed)
ED Provider at bedside. 

## 2017-01-11 NOTE — Discharge Instructions (Signed)
Your workup today showed no evidence of pneumonia.  Your spine x-ray showed no evidence of fracture.  There was evidence of degenerative disease.  I suspect you pulled a muscle and then your coughing fits have caused muscle spasms.  Please take the pain medicine and muscle relaxants to help with the discomfort.  Please use the cough medicine to help if you have severe coughing fits and cannot sleep.  Please be careful not to drive or operate heavy machinery with this medication combination.  Please do not drink alcohol with these medicines.  Please follow-up with your primary care physician for reassessment in the next several days.  If any symptoms change or worsen, please return to the nearest emergency department.

## 2017-01-11 NOTE — ED Provider Notes (Signed)
[facility]GENC[facility] DEPARTMENT Provider Note   CSN: [id number] Arrival date & time: 01/11/17  1225     History   Chief Complaint Chief Complaint  Patient presents with  . Back Pain  . Cough    HPI Justin Frank is a 61 y.o. male.  The history is provided by the patient, the spouse and medical records. No language interpreter was used.  Back Pain   This is a new problem. The current episode started more than 2 days ago. The problem occurs constantly. The problem has not changed since onset.Associated with: pt had pain after gym use last week. The pain is present in the lumbar spine. The quality of the pain is described as aching. The pain does not radiate. The pain is moderate. The pain is the same all the time. Pertinent negatives include no chest pain, no fever, no numbness, no weight loss, no headaches, no abdominal pain, no bowel incontinence, no perianal numbness, no dysuria, no leg pain, no paresthesias, no tingling and no weakness. He has tried nothing for the symptoms. The treatment provided no relief.    Past Medical History:  Diagnosis Date  . Asthma   . ED (erectile dysfunction)   . Hyperlipidemia   . Hypertension     Patient Active Problem List   Diagnosis Date Noted  . Abnormal EKG 05/15/2014  . Atypical chest pain 05/15/2014  . Essential hypertension 05/15/2014  . Hyperlipidemia 05/15/2014  . Recurrent umbilical hernia 27/25/3664    Past Surgical History:  Procedure Laterality Date  . HERNIA REPAIR         Home Medications    Prior to Admission medications   Medication Sig Start Date End Date Taking? Authorizing Provider  AMLODIPINE BESYLATE PO Take by mouth.   Yes [provider]  cyclobenzaprine (FLEXERIL) 10 MG tablet Take 10 mg by mouth 3 (three) times daily as needed for muscle spasms.    [provider]  guaiFENesin-codeine 100-10 MG/5ML syrup Take 5 mLs by mouth 3 (three) times daily as needed for  cough. Patient not taking: Reported on 05/15/2014 05/02/14   Sherwood Gambler, MD  mometasone-formoterol [facility]) 100-5 MCG/ACT AERO Inhale 2 puffs into the lungs 2 (two) times daily.    [provider]  Nebivolol HCl (BYSTOLIC PO) Take by mouth.    [provider]  rosuvastatin (CRESTOR) 20 MG tablet Take 20 mg by mouth daily.    [provider]  tadalafil (CIALIS) 5 MG tablet Take 5 mg by mouth daily as needed for erectile dysfunction.    [provider]    Family History History reviewed. No pertinent family history.  Social History Social History   Tobacco Use  . Smoking status: Former Research scientist (life sciences)  . Smokeless tobacco: Never Used  Substance Use Topics  . Alcohol use: No  . Drug use: No     Allergies   Lipitor [atorvastatin] and Lisinopril   Review of Systems Review of Systems  Constitutional: Negative for chills, diaphoresis, fatigue, fever and weight loss.  HENT: Positive for congestion and rhinorrhea.   Eyes: Negative for visual disturbance.  Respiratory: Positive for cough. Negative for chest tightness, shortness of breath and wheezing.   Cardiovascular: Negative for chest pain, palpitations and leg swelling.  Gastrointestinal: Negative for abdominal pain, bowel incontinence, constipation, diarrhea, nausea and vomiting.  Genitourinary: Negative for dysuria, flank pain and frequency.  Musculoskeletal: Positive for back pain. Negative for neck pain and neck stiffness.  Neurological: Negative for tingling,  weakness, light-headedness, numbness, headaches and paresthesias.  Psychiatric/Behavioral: Negative for confusion.  All other systems reviewed and are negative.    Physical Exam Updated Vital Signs BP (!) 161/94   Pulse 94   Temp 97.6 F (36.4 C) (Oral)   Resp 16   Ht 5\' 4"  (1.626 m)   Wt 95.3 kg (210 lb)   SpO2 100%   BMI 36.05 kg/m   Physical Exam  Constitutional: He is oriented to person, place, and time. He appears  well-developed and well-nourished. No distress.  HENT:  Head: Normocephalic and atraumatic.  Mouth/Throat: Oropharynx is clear and moist. No oropharyngeal exudate.  Eyes: Conjunctivae and EOM are normal. Pupils are equal, round, and reactive to light.  Neck: Normal range of motion. No JVD present.  Cardiovascular: Normal rate and intact distal pulses.  No murmur heard. Pulmonary/Chest: Effort normal and breath sounds normal. No tachypnea. No respiratory distress. He has no wheezes. He has no rhonchi. He has no rales. He exhibits no tenderness.  Abdominal: Soft. Normal appearance. There is no tenderness. There is no rigidity, no rebound and no CVA tenderness. No hernia.    Musculoskeletal: He exhibits tenderness.       Lumbar back: He exhibits tenderness, pain and spasm. He exhibits no bony tenderness and no swelling.       Back:  Neurological: He is alert and oriented to person, place, and time. He displays normal reflexes. No sensory deficit. He exhibits normal muscle tone.  Skin: Capillary refill takes less than 2 seconds. He is not diaphoretic. No erythema. No pallor.  Nursing note and vitals reviewed.    ED Treatments / Results  Labs (all labs ordered are listed, but only abnormal results are displayed) Labs Reviewed  CBC WITH DIFFERENTIAL/PLATELET - Abnormal; Notable for the following components:      Result Value   RBC 6.03 (*)    MCV 65.2 (*)    MCH 22.2 (*)    RDW 16.0 (*)    All other components within normal limits  COMPREHENSIVE METABOLIC PANEL - Abnormal; Notable for the following components:   Glucose, Bld 105 (*)    All other components within normal limits  URINE CULTURE  URINALYSIS, ROUTINE W REFLEX MICROSCOPIC  LIPASE, BLOOD    EKG  EKG Interpretation None       Radiology Dg Chest 2 View  Result Date: 01/11/2017 CLINICAL DATA:  Sinus congestion, cough, and sneezing for the past 2-3 weeks. It also low back pain. History of asthma, former smoker.  EXAM: CHEST  2 VIEW COMPARISON:  PA and lateral chest x-ray dated May 02, 2014 and June 02, 2009. FINDINGS: The lungs are well-expanded. There is no focal infiltrate. The hilar structures are mildly prominent. The heart and pulmonary vascularity are normal. There is gentle curvature convex toward the right centered in the mid to lower thoracic spine. IMPRESSION: Mild hyperinflation may reflect reactive airway disease or COPD. No acute pneumonia. Mild chronic right hilar prominence. Electronically Signed   By: David  Martinique M.D.   On: 01/11/2017 12:54   Dg Lumbar Spine Complete  Result Date: 01/11/2017 CLINICAL DATA:  Acute low back pain for the past week. Possible GM injury. EXAM: LUMBAR SPINE - COMPLETE 4+ VIEW COMPARISON:  CT abdomen pelvis dated March 28, 2015. FINDINGS: There is no evidence of lumbar spine fracture. Alignment is normal. Minimal anterior endplate spurring at W9-U0 and L5-S1. Intervertebral disc spaces are maintained. Mild bilateral facet arthropathy at L5-S1. IMPRESSION: Minimal degenerative changes at  L4-L5 and L5-S1. No acute osseous abnormality. Electronically Signed   By: Titus Dubin M.D.   On: 01/11/2017 15:07    Procedures Procedures (including critical care time)  Medications Ordered in ED Medications  oxyCODONE-acetaminophen (PERCOCET/ROXICET) 5-325 MG per tablet 1 tablet (1 tablet Oral Given 01/11/17 1447)  cyclobenzaprine (FLEXERIL) tablet 10 mg (10 mg Oral Given 01/11/17 1447)     Initial Impression / Assessment and Plan / ED Course  I have reviewed the triage vital signs and the nursing notes.  Pertinent labs & imaging results that were available during my care of the patient were reviewed by me and considered in my medical decision making (see chart for details).     KELVIN SENNETT is a 61 y.o. male with a past medical history significant for hypertension, hyperlipidemia, asthma, and prior umbilical hernia who presents with 3 weeks of congestion,  cough, rhinorrhea, and a one-week history of low back pain and left inguinal pain.  She said that he has had 3 weeks of congestion cough and rhinorrhea.  He has not had fevers.  Cold.  He says that he has been having strong coughing fits.  He says that last week after lifting weights at the gym, he began having low back pain bilaterally.  He reports the pain is moderate to severe.  He says he has not taken medicine for his symptoms.  He denies any loss of bowel or bladder function, leg pain, or leg weakness.  He denies any numbness or tingling.  He does have some left inguinal pain that he thinks he pulled a muscle as well.  On exam, paraspinal areas of the lumbar spine are tender.  No midline tenderness.  Lungs clear.  Chest is nontender.  Back is nontender otherwise.  No abdominal tenderness.  Mild tenderness in the left inguinal area but no palpated hernia.  Normal sensation and strength in lower extremities.  No numbness.  Normal pulses.  Based on symptoms, suspect patient is coughing fits caused him to have worsening discomfort and irritation in the setting of poor muscle from exercise.  Patient given pain medicine and muscle relaxant that helped symptoms.  Urinalysis showed no hematuria, doubt kidney stone causing his symptoms.  Patient's other laboratory testing reassuring.  X-ray showed no pneumonia.  Suspect muscular skeletal discomfort causing his symptoms.  Patient will give prescription for pain medicine and muscle relaxant.  Patient also be given Tessalon for the cough.  Patient reports that he will follow-up with PCP for reassessment and further med.  He understood return precautions.  Next  Patient had no other questions or concerns and was discharged in good condition with improving symptoms.    Final Clinical Impressions(s) / ED Diagnoses   Final diagnoses:  Cough  Upper respiratory tract infection, unspecified type  Acute bilateral low back pain without sciatica    ED  Discharge Orders        Ordered    benzonatate (TESSALON) 100 MG capsule  Every 8 hours     01/11/17 1530    cyclobenzaprine (FLEXERIL) 10 MG tablet  2 times daily PRN     01/11/17 1530    oxyCODONE-acetaminophen (PERCOCET/ROXICET) 5-325 MG tablet  Every 4 hours PRN     01/11/17 1530     Clinical Impression: 1. Cough   2. Upper respiratory tract infection, unspecified type   3. Acute bilateral low back pain without sciatica     Disposition: Discharge  Condition: Good  I have discussed the  results, Dx and Tx plan with the pt(& family if present). He/she/they expressed understanding and agree(s) with the plan. Discharge instructions discussed at great length. Strict return precautions discussed and pt &/or family have verbalized understanding of the instructions. No further questions at time of discharge.    This SmartLink is deprecated. Use AVSMEDLIST instead to display the medication list for a patient.  Follow Up: Lorelee Market, [place] [address] [phone]     [facility] [place] [facility] DEPARTMENT [address] [id number] Eula Fried [place] [address] [phone]  If symptoms worsen     Tegeler, Gwenyth Allegra, MD 01/11/17 [phone]

## 2017-01-11 NOTE — ED Triage Notes (Addendum)
Patient reports mid lower back pain x 1 week.  Reports possible injury at the gym.  Patient also reports sinus congestion, coughing, sneezing x 2-3 weeks.

## 2017-01-13 LAB — URINE CULTURE: Culture: <10000 — AB

## 2017-01-16 ENCOUNTER — Other Ambulatory Visit: Payer: Self-pay

## 2017-01-16 ENCOUNTER — Emergency Department (HOSPITAL_BASED_OUTPATIENT_CLINIC_OR_DEPARTMENT_OTHER)
Admission: EM | Admit: 2017-01-16 | Discharge: 2017-01-16 | Disposition: A | Discharge status: Home / Self Care | Payer: Managed Care, Other (non HMO) | Attending: Emergency Medicine | Admitting: Emergency Medicine

## 2017-01-16 ENCOUNTER — Encounter (HOSPITAL_BASED_OUTPATIENT_CLINIC_OR_DEPARTMENT_OTHER): Payer: Self-pay | Admitting: Emergency Medicine

## 2017-01-16 ENCOUNTER — Emergency Department (HOSPITAL_BASED_OUTPATIENT_CLINIC_OR_DEPARTMENT_OTHER): Payer: Managed Care, Other (non HMO)

## 2017-01-16 DIAGNOSIS — Z79899 Other long term (current) drug therapy: Secondary | ICD-10-CM | POA: Insufficient documentation

## 2017-01-16 DIAGNOSIS — I10 Essential (primary) hypertension: Secondary | ICD-10-CM | POA: Diagnosis not present

## 2017-01-16 DIAGNOSIS — D696 Thrombocytopenia, unspecified: Secondary | ICD-10-CM | POA: Diagnosis not present

## 2017-01-16 DIAGNOSIS — J4521 Mild intermittent asthma with (acute) exacerbation: Secondary | ICD-10-CM | POA: Insufficient documentation

## 2017-01-16 DIAGNOSIS — R61 Generalized hyperhidrosis: Secondary | ICD-10-CM | POA: Insufficient documentation

## 2017-01-16 DIAGNOSIS — R590 Localized enlarged lymph nodes: Secondary | ICD-10-CM | POA: Insufficient documentation

## 2017-01-16 DIAGNOSIS — J45909 Unspecified asthma, uncomplicated: Secondary | ICD-10-CM | POA: Diagnosis not present

## 2017-01-16 DIAGNOSIS — R55 Syncope and collapse: Secondary | ICD-10-CM | POA: Diagnosis not present

## 2017-01-16 DIAGNOSIS — Z87891 Personal history of nicotine dependence: Secondary | ICD-10-CM | POA: Diagnosis not present

## 2017-01-16 DIAGNOSIS — J209 Acute bronchitis, unspecified: Secondary | ICD-10-CM

## 2017-01-16 DIAGNOSIS — R05 Cough: Secondary | ICD-10-CM | POA: Diagnosis present

## 2017-01-16 LAB — COMPREHENSIVE METABOLIC PANEL
ALT: 26 U/L (ref 17–63)
AST: 31 U/L (ref 15–41)
Albumin: 3.9 g/dL (ref 3.5–5.0)
Alkaline Phosphatase: 104 U/L (ref 38–126)
Anion gap: 5 (ref 5–15)
BUN: 19 mg/dL (ref 6–20)
CO2: 24 mmol/L (ref 22–32)
Calcium: 8.6 mg/dL — ABNORMAL LOW (ref 8.9–10.3)
Chloride: 102 mmol/L (ref 101–111)
Creatinine, Ser: 1.09 mg/dL (ref 0.61–1.24)
GFR calc Af Amer: >60 mL/min (ref >60)
GFR calc non Af Amer: >60 mL/min (ref >60)
Glucose, Bld: 98 mg/dL (ref 65–99)
Potassium: 3.4 mmol/L — ABNORMAL LOW (ref 3.5–5.1)
Sodium: 131 mmol/L — ABNORMAL LOW (ref 135–145)
Total Bilirubin: 1 mg/dL (ref 0.3–1.2)
Total Protein: 7.9 g/dL (ref 6.5–8.1)

## 2017-01-16 LAB — CBC WITH DIFFERENTIAL/PLATELET
Basophils Absolute: 0 10*3/uL (ref 0.0–0.1)
Basophils Relative: 0 %
Eosinophils Absolute: 1.3 10*3/uL — ABNORMAL HIGH (ref 0.0–0.7)
Eosinophils Relative: 11 %
HCT: 39.3 % (ref 39.0–52.0)
Hemoglobin: 13.6 g/dL (ref 13.0–17.0)
Lymphocytes Relative: 13 %
Lymphs Abs: 1.5 10*3/uL (ref 0.7–4.0)
MCH: 22.2 pg — ABNORMAL LOW (ref 26.0–34.0)
MCHC: 34.6 g/dL (ref 30.0–36.0)
MCV: 64.1 fL — ABNORMAL LOW (ref 78.0–100.0)
Monocytes Absolute: 1 10*3/uL (ref 0.1–1.0)
Monocytes Relative: 9 %
Neutro Abs: 7.7 10*3/uL (ref 1.7–7.7)
Neutrophils Relative %: 67 %
Platelets: 38 10*3/uL — ABNORMAL LOW (ref 150–400)
RBC: 6.13 MIL/uL — ABNORMAL HIGH (ref 4.22–5.81)
RDW: 16.1 % — ABNORMAL HIGH (ref 11.5–15.5)
WBC: 11.5 10*3/uL — ABNORMAL HIGH (ref 4.0–10.5)

## 2017-01-16 LAB — URINALYSIS, ROUTINE W REFLEX MICROSCOPIC
Bilirubin Urine: NEGATIVE
Glucose, UA: NEGATIVE mg/dL
Ketones, ur: NEGATIVE mg/dL
Leukocytes, UA: NEGATIVE
Nitrite: NEGATIVE
Protein, ur: NEGATIVE mg/dL
Specific Gravity, Urine: 1.01 (ref 1.005–1.030)
pH: 6 (ref 5.0–8.0)

## 2017-01-16 LAB — URINALYSIS, MICROSCOPIC (REFLEX)

## 2017-01-16 LAB — TROPONIN I: Troponin I: <0.03 ng/mL (ref <0.03)

## 2017-01-16 MED ORDER — IOPAMIDOL (ISOVUE-370) INJECTION 76%
100.0000 mL | Freq: Once | INTRAVENOUS | Status: AC | PRN
  Administered 2017-01-16: 100 mL via INTRAVENOUS

## 2017-01-16 MED ORDER — AZITHROMYCIN 250 MG PO TABS: ORAL_TABLET | ORAL | 0 refills | Status: DC

## 2017-01-16 MED ORDER — AEROCHAMBER PLUS FLO-VU MEDIUM MISC
1.0000 | Freq: Once | Status: AC
  Administered 2017-01-16: 1
  Filled 2017-01-16: qty 1

## 2017-01-16 MED ORDER — SODIUM CHLORIDE 0.9 % IV BOLUS (SEPSIS)
1000.0000 mL | Freq: Once | INTRAVENOUS | Status: AC
  Administered 2017-01-16: 1000 mL via INTRAVENOUS

## 2017-01-16 MED ORDER — IPRATROPIUM-ALBUTEROL 0.5-2.5 (3) MG/3ML IN SOLN
3.0000 mL | Freq: Four times a day (QID) | RESPIRATORY_TRACT | Status: DC
  Administered 2017-01-16: 3 mL via RESPIRATORY_TRACT
  Filled 2017-01-16: qty 3

## 2017-01-16 MED ORDER — ALBUTEROL SULFATE HFA 108 (90 BASE) MCG/ACT IN AERS
1.0000 | INHALATION_SPRAY | Freq: Once | RESPIRATORY_TRACT | Status: AC
  Administered 2017-01-16: 2 via RESPIRATORY_TRACT
  Filled 2017-01-16: qty 6.7

## 2017-01-16 MED ORDER — CEFTRIAXONE SODIUM 1 G IJ SOLR
1.0000 g | Freq: Once | INTRAMUSCULAR | Status: AC
  Administered 2017-01-16: 1 g via INTRAVENOUS
  Filled 2017-01-16: qty 10

## 2017-01-16 MED ORDER — AZITHROMYCIN 250 MG PO TABS
500.0000 mg | ORAL_TABLET | Freq: Once | ORAL | Status: AC
  Administered 2017-01-16: 500 mg via ORAL
  Filled 2017-01-16: qty 2

## 2017-01-16 MED ORDER — METHYLPREDNISOLONE SODIUM SUCC 125 MG IJ SOLR
125.0000 mg | Freq: Once | INTRAMUSCULAR | Status: AC
Start: 2017-01-16 — End: 2017-01-16
  Administered 2017-01-16: 125 mg via INTRAVENOUS
  Filled 2017-01-16: qty 2

## 2017-01-16 MED ORDER — PREDNISONE 10 MG (21) PO TBPK: ORAL_TABLET | ORAL | 0 refills | Status: DC

## 2017-01-16 NOTE — ED Triage Notes (Signed)
Pt sts he has continued cough and is "coughing so much I passed out" - sts passed out on Thurs and 2x today

## 2017-01-16 NOTE — Discharge Instructions (Addendum)
Make sure to follow up with pcp after you are feeling better to make sure your lymph nodes in your chest have shrunk. You also need to get your platelet count rechecked by your doctor.

## 2017-01-16 NOTE — ED Provider Notes (Addendum)
[place] EMERGENCY DEPARTMENT Provider Note   CSN: [id number] Arrival date & time: 01/16/17  1334     History   Chief Complaint Chief Complaint  Patient presents with  . Cough    HPI Justin Frank is a 61 y.o. male.  Pt presents to the ED today with a cough.  Pt has had a cough for the past week.  He said he's been coughing so much that he's passed out 3 times.  The most recent was right before arrival.  His wife witnessed it.  She said he coughed really hard, then looked like he could not get a breath and would not respond to her.  When he finally did, he was diaphoretic.  Pt said he has been using otc meds for sx, but they have not been helping.  Pt did come to the ED on 11/12 and was told he had a cold and was given tessalon perles.  He said he's gotten much worse.      Past Medical History:  Diagnosis Date  . Asthma   . ED (erectile dysfunction)   . Hyperlipidemia   . Hypertension     Patient Active Problem List   Diagnosis Date Noted  . Abnormal EKG 05/15/2014  . Atypical chest pain 05/15/2014  . Essential hypertension 05/15/2014  . Hyperlipidemia 05/15/2014  . Recurrent umbilical hernia 14/48/1856    Past Surgical History:  Procedure Laterality Date  . HERNIA REPAIR         Home Medications    Prior to Admission medications   Medication Sig Start Date End Date Taking? Authorizing Provider  AMLODIPINE BESYLATE PO Take by mouth.    [provider]  azithromycin (ZITHROMAX) 250 MG tablet Take 1 every day until finished. 01/17/17   Isla Pence, MD  benzonatate (TESSALON) 100 MG capsule Take 1 capsule (100 mg total) every 8 (eight) hours by mouth. 01/11/17   Tegeler, Gwenyth Allegra, MD  cyclobenzaprine (FLEXERIL) 10 MG tablet Take 10 mg by mouth 3 (three) times daily as needed for muscle spasms.    [provider]  cyclobenzaprine (FLEXERIL) 10 MG tablet Take 1 tablet (10 mg total) 2 (two) times daily as needed by  mouth for muscle spasms. 01/11/17   Tegeler, Gwenyth Allegra, MD  guaiFENesin-codeine 100-10 MG/5ML syrup Take 5 mLs by mouth 3 (three) times daily as needed for cough. Patient not taking: Reported on 05/15/2014 05/02/14   Sherwood Gambler, MD  mometasone-formoterol [facility]) 100-5 MCG/ACT AERO Inhale 2 puffs into the lungs 2 (two) times daily.    [provider]  Nebivolol HCl (BYSTOLIC PO) Take by mouth.    [provider]  oxyCODONE-acetaminophen (PERCOCET/ROXICET) 5-325 MG tablet Take 1 tablet every 4 (four) hours as needed by mouth for severe pain. 01/11/17   Tegeler, Gwenyth Allegra, MD  predniSONE (STERAPRED UNI-PAK 21 TAB) 10 MG (21) TBPK tablet Take 6 tabs by mouth daily  for 2 days, then 5 tabs for 2 days, then 4 tabs for 2 days, then 3 tabs for 2 days, 2 tabs for 2 days, then 1 tab by mouth daily for 2 days 01/16/17   Isla Pence, MD  rosuvastatin (CRESTOR) 20 MG tablet Take 20 mg by mouth daily.    [provider]  tadalafil (CIALIS) 5 MG tablet Take 5 mg by mouth daily as needed for erectile dysfunction.    [provider]    Family History No family history on file.  Social History Social History  Tobacco Use  . Smoking status: Former Research scientist (life sciences)  . Smokeless tobacco: Never Used  Substance Use Topics  . Alcohol use: No  . Drug use: No     Allergies   Lipitor [atorvastatin] and Lisinopril   Review of Systems Review of Systems  Respiratory: Positive for cough and shortness of breath.   All other systems reviewed and are negative.    Physical Exam Updated Vital Signs BP (!) 155/93   Pulse 98   Temp 98.2 F (36.8 C) (Oral)   Resp (!) 25   Ht 5\' 4"  (1.626 m)   Wt 95.3 kg (210 lb)   SpO2 95%   BMI 36.05 kg/m   Physical Exam  Constitutional: He is oriented to person, place, and time. He appears well-developed and well-nourished.  HENT:  Head: Normocephalic and atraumatic.  Right Ear: External ear normal.  Left Ear: External ear  normal.  Nose: Nose normal.  Mouth/Throat: Oropharynx is clear and moist.  Eyes: Conjunctivae and EOM are normal. Pupils are equal, round, and reactive to light.  Neck: Normal range of motion. Neck supple.  Cardiovascular: Normal rate, regular rhythm, normal heart sounds and intact distal pulses.  Pulmonary/Chest: Effort normal. He has wheezes.  Abdominal: Soft. Bowel sounds are normal.  Musculoskeletal: Normal range of motion.  Neurological: He is alert and oriented to person, place, and time.  Skin: Skin is warm and dry.  Psychiatric: He has a normal mood and affect. His behavior is normal. Judgment and thought content normal.  Nursing note and vitals reviewed.    ED Treatments / Results  Labs (all labs ordered are listed, but only abnormal results are displayed) Labs Reviewed  COMPREHENSIVE METABOLIC PANEL - Abnormal; Notable for the following components:      Result Value   Sodium 131 (*)    Potassium 3.4 (*)    Calcium 8.6 (*)    All other components within normal limits  CBC WITH DIFFERENTIAL/PLATELET - Abnormal; Notable for the following components:   WBC 11.5 (*)    RBC 6.13 (*)    MCV 64.1 (*)    MCH 22.2 (*)    RDW 16.1 (*)    Platelets 38 (*)    Eosinophils Absolute 1.3 (*)    All other components within normal limits  URINALYSIS, ROUTINE W REFLEX MICROSCOPIC - Abnormal; Notable for the following components:   Hgb urine dipstick TRACE (*)    All other components within normal limits  URINALYSIS, MICROSCOPIC (REFLEX) - Abnormal; Notable for the following components:   Bacteria, UA RARE (*)    Squamous Epithelial / LPF 0-5 (*)    All other components within normal limits  TROPONIN I    EKG  EKG Interpretation  Date/Time:  Saturday January 16 2017 14:01:21 EST Ventricular Rate:  100 PR Interval:    QRS Duration: 85 QT Interval:  316 QTC Calculation: 408 R Axis:   19 Text Interpretation:  Sinus tachycardia Consider left atrial enlargement Nonspecific T  abnormalities, lateral leads No significant change since last tracing Confirmed by Isla Pence [phone]) on 01/16/2017 2:06:49 PM Also confirmed by Isla Pence [phone]), editor Philomena Doheny [phone])  on 01/16/2017 2:34:09 PM       Radiology Ct Angio Chest Pe W And/or Wo Contrast  Result Date: 01/16/2017 CLINICAL DATA:  Cough, congestion. EXAM: CT ANGIOGRAPHY CHEST WITH CONTRAST TECHNIQUE: Multidetector CT imaging of the chest was performed using the standard protocol during bolus administration of intravenous contrast. Multiplanar CT image reconstructions and MIPs  were obtained to evaluate the vascular anatomy. CONTRAST:  155mL ISOVUE-370 IOPAMIDOL (ISOVUE-370) INJECTION 76% COMPARISON:  Chest x-ray 01/11/2017 FINDINGS: Cardiovascular: No filling defects in the pulmonary artery is to suggest pulmonary emboli. Heart is normal size. Aorta is normal caliber. Calcifications noted in the left anterior descending coronary artery. Mediastinum/Nodes: Enlarged bilateral hilar and mediastinal lymph nodes. Index right paratracheal lymph node has a short axis diameter of 13 mm. Index left hilar lymph node has a short axis diameter of 14 mm. Index right hilar lymph node has a short axis diameter of 17 mm. Small hiatal hernia. Lungs/Pleura: Bilateral noncalcified pleural plaques noted, best seen along the diaphragms. No pleural effusions. No confluent airspace opacities. Upper Abdomen: Imaging into the upper abdomen shows no acute findings. Musculoskeletal: Chest wall soft tissues are unremarkable. No acute bony abnormality. Review of the MIP images confirms the above findings. IMPRESSION: Mediastinal and bilateral hilar adenopathy. Differential considerations would include sarcoidosis, lymphoproliferative disorders, for reactive lymph nodes. Bilateral noncalcified pleural plaques. Small hiatal hernia. Coronary artery disease. Electronically Signed   By: Rolm Baptise M.D.   On: 01/16/2017 15:13     Procedures Procedures (including critical care time)  Medications Ordered in ED Medications  ipratropium-albuterol (DUONEB) 0.5-2.5 (3) MG/3ML nebulizer solution 3 mL (3 mLs Nebulization Given 01/16/17 1408)  cefTRIAXone (ROCEPHIN) 1 g in dextrose 5 % 50 mL IVPB (not administered)  azithromycin (ZITHROMAX) tablet 500 mg (not administered)  sodium chloride 0.9 % bolus 1,000 mL (0 mLs Intravenous Stopped 01/16/17 1453)  methylPREDNISolone sodium succinate (SOLU-MEDROL) 125 mg/2 mL injection 125 mg (125 mg Intravenous Given 01/16/17 1412)  iopamidol (ISOVUE-370) 76 % injection 100 mL (100 mLs Intravenous Contrast Given 01/16/17 1451)  albuterol (PROVENTIL HFA;VENTOLIN HFA) 108 (90 Base) MCG/ACT inhaler 1-2 puff (2 puffs Inhalation Given 01/16/17 1540)  AEROCHAMBER PLUS FLO-VU MEDIUM MISC 1 each (1 each Other Given 01/16/17 1540)     Initial Impression / Assessment and Plan / ED Course  I have reviewed the triage vital signs and the nursing notes.  Pertinent labs & imaging results that were available during my care of the patient were reviewed by me and considered in my medical decision making (see chart for details).    Pt is feeling much better.  He will be given a dose of rocephin and zithromax prior to d/c.  He will also be given an albuterol inhaler as well.  Pt knows to f/u with his doctor regarding the mediastinal lymphadenopathy.  It is most likely reactive, but he is told he needs to get it rechecked when well.  Platelet count will also need to be rechecked.  Pt has not bleeding.  Return if worse.  Final Clinical Impressions(s) / ED Diagnoses   Final diagnoses:  Acute bronchitis, unspecified organism  Mild intermittent reactive airway disease with acute exacerbation  Lymphadenopathy, mediastinal  Thrombocytopenia [facility])    ED Discharge Orders        Ordered    azithromycin (ZITHROMAX) 250 MG tablet     01/16/17 1528    predniSONE (STERAPRED UNI-PAK 21 TAB) 10 MG (21)  TBPK tablet     01/16/17 1528       Isla Pence, MD 01/16/17 1531    Isla Pence, MD 01/16/17 [phone]

## 2017-01-16 NOTE — ED Notes (Signed)
Pt and wife given d/c instructions as per chart. Rx x 2. Verbalizes understanding. No questions.

## 2017-02-15 DIAGNOSIS — E669 Obesity, unspecified: Secondary | ICD-10-CM | POA: Insufficient documentation

## 2017-02-15 DIAGNOSIS — R718 Other abnormality of red blood cells: Secondary | ICD-10-CM | POA: Insufficient documentation

## 2017-02-15 DIAGNOSIS — R590 Localized enlarged lymph nodes: Secondary | ICD-10-CM | POA: Insufficient documentation

## 2017-02-15 DIAGNOSIS — D696 Thrombocytopenia, unspecified: Secondary | ICD-10-CM | POA: Insufficient documentation

## 2017-02-15 DIAGNOSIS — D560 Alpha thalassemia: Secondary | ICD-10-CM | POA: Insufficient documentation

## 2017-02-28 ENCOUNTER — Emergency Department (HOSPITAL_BASED_OUTPATIENT_CLINIC_OR_DEPARTMENT_OTHER): Payer: Managed Care, Other (non HMO)

## 2017-02-28 ENCOUNTER — Emergency Department (HOSPITAL_BASED_OUTPATIENT_CLINIC_OR_DEPARTMENT_OTHER)
Admission: EM | Admit: 2017-02-28 | Discharge: 2017-02-28 | Disposition: A | Discharge status: Home / Self Care | Payer: Managed Care, Other (non HMO) | Attending: Emergency Medicine | Admitting: Emergency Medicine

## 2017-02-28 ENCOUNTER — Other Ambulatory Visit: Payer: Self-pay

## 2017-02-28 ENCOUNTER — Encounter (HOSPITAL_BASED_OUTPATIENT_CLINIC_OR_DEPARTMENT_OTHER): Payer: Self-pay | Admitting: Emergency Medicine

## 2017-02-28 DIAGNOSIS — R21 Rash and other nonspecific skin eruption: Secondary | ICD-10-CM | POA: Diagnosis present

## 2017-02-28 DIAGNOSIS — I10 Essential (primary) hypertension: Secondary | ICD-10-CM | POA: Insufficient documentation

## 2017-02-28 DIAGNOSIS — J40 Bronchitis, not specified as acute or chronic: Secondary | ICD-10-CM | POA: Diagnosis not present

## 2017-02-28 DIAGNOSIS — Z79899 Other long term (current) drug therapy: Secondary | ICD-10-CM | POA: Diagnosis not present

## 2017-02-28 DIAGNOSIS — Z87891 Personal history of nicotine dependence: Secondary | ICD-10-CM | POA: Diagnosis not present

## 2017-02-28 DIAGNOSIS — L509 Urticaria, unspecified: Secondary | ICD-10-CM | POA: Diagnosis not present

## 2017-02-28 MED ORDER — HYDROXYZINE HCL 25 MG PO TABS: 25.0000 mg | ORAL_TABLET | Freq: Once | ORAL | Status: DC

## 2017-02-28 MED ORDER — DIPHENHYDRAMINE HCL 50 MG/ML IJ SOLN
25.0000 mg | Freq: Once | INTRAMUSCULAR | Status: AC
  Administered 2017-02-28: 25 mg via INTRAMUSCULAR
  Filled 2017-02-28: qty 1

## 2017-02-28 MED ORDER — GUAIFENESIN ER 1200 MG PO TB12: 1.0000 | ORAL_TABLET | Freq: Two times a day (BID) | ORAL | 0 refills | Status: DC

## 2017-02-28 MED ORDER — HYDROXYZINE HCL 25 MG PO TABS: 25.0000 mg | ORAL_TABLET | Freq: Four times a day (QID) | ORAL | 0 refills | Status: DC

## 2017-02-28 MED ORDER — PREDNISONE 10 MG PO TABS
60.0000 mg | ORAL_TABLET | Freq: Once | ORAL | Status: AC
  Administered 2017-02-28: 60 mg via ORAL
  Filled 2017-02-28: qty 1

## 2017-02-28 MED ORDER — PREDNISONE 10 MG (21) PO TBPK: ORAL_TABLET | ORAL | 0 refills | Status: DC

## 2017-02-28 NOTE — Discharge Instructions (Signed)
Take the medications as prescribed.  Please follow-up with a primary doctor.  Consider seeing a pulmonary doctor considering the persistent symptoms and the CT scan findings last month

## 2017-02-28 NOTE — ED Provider Notes (Signed)
[place] EMERGENCY DEPARTMENT Provider Note   CSN: [id number] Arrival date & time: 02/28/17  1605     History   Chief Complaint Chief Complaint  Patient presents with  . Rash  . Cough    HPI Justin Frank is a 61 y.o. male.  HPI Patient presents to the emergency room for evaluation of recurrent cough and congestion.  Patient states he has had recurrent episodes of this.  He was seen by his primary doctor and also seen in the emergency room back in November.  Patient feels like whenever he stops taking the medications he gets prescribed his symptoms return.  The last month or so he has had persistent coughing and congestion.  He feels like he has something stuck in his chest.  This morning however he also had an episode of vomiting after eating breakfast.  He also started noticing a rash on his back.  He tried taking Benadryl but did not feel much better. Past Medical History:  Diagnosis Date  . Asthma   . ED (erectile dysfunction)   . Hyperlipidemia   . Hypertension     Patient Active Problem List   Diagnosis Date Noted  . Abnormal EKG 05/15/2014  . Atypical chest pain 05/15/2014  . Essential hypertension 05/15/2014  . Hyperlipidemia 05/15/2014  . Recurrent umbilical hernia 54/62/7035    Past Surgical History:  Procedure Laterality Date  . HERNIA REPAIR         Home Medications    Prior to Admission medications   Medication Sig Start Date End Date Taking? Authorizing Provider  AMLODIPINE BESYLATE PO Take by mouth.    [provider]  azithromycin (ZITHROMAX) 250 MG tablet Take 1 every day until finished. 01/17/17   Isla Pence, MD  benzonatate (TESSALON) 100 MG capsule Take 1 capsule (100 mg total) every 8 (eight) hours by mouth. 01/11/17   Tegeler, Gwenyth Allegra, MD  cyclobenzaprine (FLEXERIL) 10 MG tablet Take 10 mg by mouth 3 (three) times daily as needed for muscle spasms.    [provider]  cyclobenzaprine  (FLEXERIL) 10 MG tablet Take 1 tablet (10 mg total) 2 (two) times daily as needed by mouth for muscle spasms. 01/11/17   Tegeler, Gwenyth Allegra, MD  guaiFENesin-codeine 100-10 MG/5ML syrup Take 5 mLs by mouth 3 (three) times daily as needed for cough. Patient not taking: Reported on 05/15/2014 05/02/14   Sherwood Gambler, MD  mometasone-formoterol [facility]) 100-5 MCG/ACT AERO Inhale 2 puffs into the lungs 2 (two) times daily.    [provider]  Nebivolol HCl (BYSTOLIC PO) Take by mouth.    [provider]  oxyCODONE-acetaminophen (PERCOCET/ROXICET) 5-325 MG tablet Take 1 tablet every 4 (four) hours as needed by mouth for severe pain. 01/11/17   Tegeler, Gwenyth Allegra, MD  predniSONE (STERAPRED UNI-PAK 21 TAB) 10 MG (21) TBPK tablet Take 6 tabs by mouth daily  for 2 days, then 5 tabs for 2 days, then 4 tabs for 2 days, then 3 tabs for 2 days, 2 tabs for 2 days, then 1 tab by mouth daily for 2 days 01/16/17   Isla Pence, MD  rosuvastatin (CRESTOR) 20 MG tablet Take 20 mg by mouth daily.    [provider]  tadalafil (CIALIS) 5 MG tablet Take 5 mg by mouth daily as needed for erectile dysfunction.    [provider]    Family History History reviewed. No pertinent family history.  Social History Social History   Tobacco Use  .  Smoking status: Former Research scientist (life sciences)  . Smokeless tobacco: Never Used  Substance Use Topics  . Alcohol use: No  . Drug use: No     Allergies   Lipitor [atorvastatin] and Lisinopril   Review of Systems Review of Systems  All other systems reviewed and are negative.    Physical Exam Updated Vital Signs BP 137/86 (BP Location: Right Arm)   Pulse (!) 106   Temp 99.4 F (37.4 C) (Oral)   Resp 20   Ht 1.626 m (5\' 4" )   Wt 93.4 kg (206 lb)   SpO2 100%   BMI 35.36 kg/m   Physical Exam  Constitutional: He appears well-developed and well-nourished. No distress.  HENT:  Head: Normocephalic and atraumatic.  Right Ear: External  ear normal.  Left Ear: External ear normal.  Mouth/Throat: Oropharynx is clear and moist. No oropharyngeal exudate.  Eyes: Conjunctivae are normal. Right eye exhibits no discharge. Left eye exhibits no discharge. No scleral icterus.  Neck: Neck supple. No tracheal deviation present.  Cardiovascular: Normal rate, regular rhythm and intact distal pulses.  Pulmonary/Chest: Effort normal and breath sounds normal. No stridor. No respiratory distress. He has no wheezes. He has no rales.  Abdominal: Soft. Bowel sounds are normal. He exhibits no distension. There is no tenderness. There is no rebound and no guarding.  Musculoskeletal: He exhibits no edema or tenderness.  Neurological: He is alert. He has normal strength. No cranial nerve deficit (no facial droop, extraocular movements intact, no slurred speech) or sensory deficit. He exhibits normal muscle tone. He displays no seizure activity. Coordination normal.  Skin: Skin is warm and dry. Rash noted.  Urticarial type lesions on his back  Psychiatric: He has a normal mood and affect.  Nursing note and vitals reviewed.    ED Treatments / Results    Radiology Dg Chest 2 View  Result Date: 02/28/2017 CLINICAL DATA:  One month history of cough. EXAM: CHEST  2 VIEW COMPARISON:  01/11/2017 FINDINGS: Right lung clear. Pulmonary nodule identified at the left lung base projecting between the posterior ninth and tenth ribs. Left lung otherwise clear. The cardiopericardial silhouette is within normal limits for size. The visualized bony structures of the thorax are intact. IMPRESSION: 1. No acute cardiopulmonary findings. 2. Nodular density projects over the left lung base. This may be a nipple shadow, but CT chest without contrast recommended to exclude underlying pulmonary nodule. Electronically Signed   By: Misty Stanley M.D.   On: 02/28/2017 16:42    Procedures Procedures (including critical care time)  Medications Ordered in ED Medications    predniSONE (DELTASONE) tablet 60 mg (not administered)  diphenhydrAMINE (BENADRYL) injection 25 mg (not administered)     Initial Impression / Assessment and Plan / ED Course  I have reviewed the triage vital signs and the nursing notes.  Pertinent labs & imaging results that were available during my care of the patient were reviewed by me and considered in my medical decision making (see chart for details).   Patient presents to the emergency room for recurrent episodes with cough and congestion.  He also had an episode of urticaria today.  Patient states he has been prescribed steroids in the past.  I wonder if he may be having some sort of environmental trigger.  Chest x-ray suggest the possibility of some pulmonary nodules.  I reviewed his previous records and he actually had a CT scan of his chest last month.  They did note mediastinal and bilateral hilar.  No  evidence of any pulmonary masses.  I do not think he requires repeat CT scanning.  I do think patient follow-up with his primary doctor and possibly pulmonologist considering his prior CT scan findings.  I discussed this with the patient and family  Final Clinical Impressions(s) / ED Diagnoses   Final diagnoses:  Urticaria  Bronchitis    ED Discharge Orders    None       Dorie Rank, MD 02/28/17 2016

## 2017-02-28 NOTE — ED Notes (Signed)
ED Provider at bedside. 

## 2017-02-28 NOTE — ED Triage Notes (Addendum)
Reports cough for the past month.  States seen in November 2x for same.  States it gets better and then worse.  Reports he also began taking vitamin d for the past 4 days but then began having a rash on his back, legs and arms.  Reports constant scratching.  States took benadryl this morning without relief.  Also reports vomiting after eating breakfast this morning.  Patient in NAD at present, maintaining secretions without difficulty and speaking in full sentences.

## 2017-03-15 ENCOUNTER — Ambulatory Visit (INDEPENDENT_AMBULATORY_CARE_PROVIDER_SITE_OTHER): Payer: Managed Care, Other (non HMO) | Admitting: Osteopathic Medicine

## 2017-03-15 ENCOUNTER — Encounter: Payer: Self-pay | Admitting: Osteopathic Medicine

## 2017-03-15 VITALS — BP 146/82 | HR 88 | Temp 98.0°F | Ht 64.0 in | Wt 204.1 lb

## 2017-03-15 DIAGNOSIS — R05 Cough: Secondary | ICD-10-CM

## 2017-03-15 DIAGNOSIS — R5383 Other fatigue: Secondary | ICD-10-CM | POA: Diagnosis not present

## 2017-03-15 DIAGNOSIS — R053 Chronic cough: Secondary | ICD-10-CM

## 2017-03-15 DIAGNOSIS — Z23 Encounter for immunization: Secondary | ICD-10-CM

## 2017-03-15 DIAGNOSIS — R635 Abnormal weight gain: Secondary | ICD-10-CM

## 2017-03-15 DIAGNOSIS — I10 Essential (primary) hypertension: Secondary | ICD-10-CM

## 2017-03-15 DIAGNOSIS — E559 Vitamin D deficiency, unspecified: Secondary | ICD-10-CM

## 2017-03-15 DIAGNOSIS — R7301 Impaired fasting glucose: Secondary | ICD-10-CM

## 2017-03-15 DIAGNOSIS — R7309 Other abnormal glucose: Secondary | ICD-10-CM

## 2017-03-15 DIAGNOSIS — Z125 Encounter for screening for malignant neoplasm of prostate: Secondary | ICD-10-CM | POA: Diagnosis not present

## 2017-03-15 MED ORDER — OMEPRAZOLE 40 MG PO CPDR: 40.0000 mg | DELAYED_RELEASE_CAPSULE | Freq: Every day | ORAL | 0 refills | Status: DC

## 2017-03-15 NOTE — Progress Notes (Signed)
HPI: Justin Frank is a 62 y.o. male who  has a past medical history of Asthma, ED (erectile dysfunction), Hyperlipidemia, and Hypertension.  he presents to [facility] today, 03/15/17,  for chief complaint of:  Chief Complaint  Patient presents with  . Establish - GERD, thyroid concern, numbness     Main concern today is heartburn and would like thyroid check.  Chronic cough: Has been ongoing for at least 6 months or so. No history of COPD or recent tobacco use, he quit smoking about 40 years ago. Describes as a dry cough, nagging, no coughing of blood, no significant shortness of breath. He does have a history of some asbestos exposure when he was younger. Mediastinal lymphadenopathy incidental finding on CT for treatment/workup of bronchitis.  HTN: on amlodipine. 158/82 on intake. Systolic dropped about 10 points prior to discharge. No home blood pressures to report. No chest pain, pressure, shortness of breath.  HLD: on atorvastatin.   Thrombocytopenia/Anemia: Following with [facility] in [place]. Dr. Everardo All. Last seen 02/15/2017 - thalassemia trait was suspected, platelets possibly related to acute probable viral illness. Consider CT scan of abdomen/pelvis to look for other lymphadenopathy versus splenomegaly.     Patient is accompanied by wife who assists with history-taking.   Past medical, surgical, social and family history reviewed:  Patient Active Problem List   Diagnosis Date Noted  . Class 2 obesity in adult 02/15/2017  . Lymphadenopathy, mediastinal 02/15/2017  . RBC microcytosis 02/15/2017  . Thrombocytopenia ([place]) 02/15/2017  . Abnormal EKG 05/15/2014  . Atypical chest pain 05/15/2014  . Essential hypertension 05/15/2014  . Hyperlipidemia 05/15/2014  . Recurrent umbilical hernia 82/42/3536    Past Surgical History:  Procedure Laterality Date  . HERNIA REPAIR      Social History   Tobacco Use   . Smoking status: Former Research scientist (life sciences)  . Smokeless tobacco: Never Used  Substance Use Topics  . Alcohol use: No    No family history on file.   Current medication list and allergy/intolerance information reviewed:    Current Outpatient Medications  Medication Sig Dispense Refill  . amLODipine (NORVASC) 10 MG tablet TK 1 T PO QD  4  . atorvastatin (LIPITOR) 10 MG tablet     . cetirizine (ZYRTEC) 10 MG tablet Take by mouth.    . cyclobenzaprine (FLEXERIL) 10 MG tablet Take 1 tablet (10 mg total) 2 (two) times daily as needed by mouth for muscle spasms. 20 tablet 0  . fluticasone (FLONASE) 50 MCG/ACT nasal spray Place into the nose.    . hydrOXYzine (ATARAX/VISTARIL) 25 MG tablet Take 1 tablet (25 mg total) by mouth every 6 (six) hours. 12 tablet 0  . omeprazole (PRILOSEC) 40 MG capsule Take 1 capsule (40 mg total) by mouth daily. 90 capsule 0   No current facility-administered medications for this visit.     Allergies  Allergen Reactions  . Lipitor [Atorvastatin]   . Lisinopril Cough      Review of Systems:  Constitutional:  No  fever, no chills, No recent illness, No unintentional weight changes. No significant fatigue.   HEENT: No  headache, no vision change, no hearing change, No sore throat, No  sinus pressure  Cardiac: No  chest pain, No  pressure, No palpitations, No  Orthopnea  Respiratory:  No  shortness of breath. No  Cough  Gastrointestinal: No  abdominal pain, No  nausea, No  vomiting,  No  blood in stool, No  diarrhea, No  constipation   Musculoskeletal: No new myalgia/arthralgia  Genitourinary: No  incontinence, No  abnormal genital bleeding, No abnormal genital discharge  Skin: No  Rash, No other wounds/concerning lesions  Hem/Onc: No  easy bruising/bleeding, No  abnormal lymph node  Endocrine: No cold intolerance,  No heat intolerance. No polyuria/polydipsia/polyphagia   Neurologic: No  weakness, No  dizziness, No  slurred speech/focal weakness/facial  droop  Psychiatric: No  concerns with depression, No  concerns with anxiety, No sleep problems, No mood problems  Exam:  BP (!) 146/82   Pulse 88   Temp 98 F (36.7 C) (Oral)   Ht 5\' 4"  (1.626 m)   Wt 204 lb 1.3 oz (92.6 kg)   BMI 35.03 kg/m   Constitutional: VS see above. General Appearance: alert, well-developed, well-nourished, NAD  Eyes: Normal lids and conjunctive, non-icteric sclera  Ears, Nose, Mouth, Throat: MMM, Normal external inspection ears/nares/mouth/lips/gums. TM normal bilaterally. Pharynx/tonsils no erythema, no exudate. Nasal mucosa normal.   Neck: No masses, trachea midline. No thyroid enlargement. No tenderness/mass appreciated. No lymphadenopathy  Respiratory: Normal respiratory effort. no wheeze, no rhonchi, no rales  Cardiovascular: S1/S2 normal, no murmur, no rub/gallop auscultated. RRR. No lower extremity edema  Gastrointestinal: Nontender, no masses. No hepatomegaly, no splenomegaly. No hernia appreciated. Bowel sounds normal. Rectal exam deferred.   Musculoskeletal: Gait normal. No clubbing/cyanosis of digits.   Neurological: Normal balance/coordination. No tremor.  Skin: warm, dry, intact. No rash/ulcer. No concerning nevi or subq nodules on limited exam.    Psychiatric: Normal judgment/insight. Normal mood and affect. Oriented x3.    Results for orders placed or performed in visit on 03/15/17 (from the past 72 hour(s))  CBC     Status: Abnormal   Collection Time: 03/15/17  4:45 PM  Result Value Ref Range   WBC 11.6 (H) 3.8 - 10.8 Thousand/uL   RBC 6.27 (H) 4.20 - 5.80 Million/uL   Hemoglobin 13.9 13.2 - 17.1 g/dL   HCT 43.3 38.5 - 50.0 %   MCV 69.1 (L) 80.0 - 100.0 fL   MCH 22.2 (L) 27.0 - 33.0 pg   MCHC 32.1 32.0 - 36.0 g/dL   RDW 18.6 (H) 11.0 - 15.0 %   Platelets 210 140 - 400 Thousand/uL   MPV 9.9 7.5 - 12.5 fL  Hemoglobin A1c     Status: Abnormal   Collection Time: 03/15/17  4:45 PM  Result Value Ref Range   Hgb A1c MFr Bld 6.9  (H) <5.7 % of total Hgb    Comment: For someone without known diabetes, a hemoglobin A1c value of 6.5% or greater indicates that they may have  diabetes and this should be confirmed with a follow-up  test. . For someone with known diabetes, a value <7% indicates  that their diabetes is well controlled and a value  greater than or equal to 7% indicates suboptimal  control. A1c targets should be individualized based on  duration of diabetes, age, comorbid conditions, and  other considerations. . Currently, no consensus exists regarding use of hemoglobin A1c for diagnosis of diabetes for children. .    Mean Plasma Glucose 151 (calc)   eAG (mmol/L) 8.4 (calc)  TSH     Status: None   Collection Time: 03/15/17  4:45 PM  Result Value Ref Range   TSH 0.86 0.40 - 4.50 mIU/L  Lipid panel     Status: None   Collection Time: 03/15/17  4:45 PM  Result Value Ref Range   Cholesterol 153 <  200 mg/dL   HDL 61 >40 mg/dL   Triglycerides 131 <150 mg/dL   LDL Cholesterol (Calc) 71 mg/dL (calc)    Comment: Reference range: <100 . Desirable range <100 mg/dL for primary prevention;   <70 mg/dL for patients with CHD or diabetic patients  with > or = 2 CHD risk factors. Marland Kitchen LDL-C is now calculated using the Martin-Hopkins  calculation, which is a validated novel method providing  better accuracy than the Friedewald equation in the  estimation of LDL-C.  Cresenciano Genre et al. Annamaria Helling. 6073;710(62): 2061-2068  (http://education.[facility].com/faq/FAQ164)    Total CHOL/HDL Ratio 2.5 <5.0 (calc)   Non-HDL Cholesterol (Calc) 92 <130 mg/dL (calc)    Comment: For patients with diabetes plus 1 major ASCVD risk  factor, treating to a non-HDL-C goal of <100 mg/dL  (LDL-C of <70 mg/dL) is considered a therapeutic  option.   COMPLETE METABOLIC PANEL WITH GFR     Status: Abnormal   Collection Time: 03/15/17  4:45 PM  Result Value Ref Range   Glucose, Bld 112 (H) 65 - 99 mg/dL    Comment: .             Fasting reference interval . For someone without known diabetes, a glucose value between 100 and 125 mg/dL is consistent with prediabetes and should be confirmed with a follow-up test. .    BUN 13 7 - 25 mg/dL   Creat 0.92 0.70 - 1.33 mg/dL    Comment: For patients >22 years of age, the reference limit for Creatinine is approximately 13% higher for people identified as African-American. .    GFR, Est Non African American 91 > OR = 60 mL/min/1.64m2   GFR, Est African American 105 > OR = 60 mL/min/1.56m2   BUN/Creatinine Ratio NOT APPLICABLE 6 - 22 (calc)   Sodium 136 135 - 146 mmol/L   Potassium 4.3 3.5 - 5.3 mmol/L   Chloride 101 98 - 110 mmol/L   CO2 30 20 - 32 mmol/L   Calcium 9.1 8.6 - 10.3 mg/dL   Total Protein 7.2 6.1 - 8.1 g/dL   Albumin 4.3 3.6 - 5.1 g/dL   Globulin 2.9 1.9 - 3.7 g/dL (calc)   AG Ratio 1.5 1.0 - 2.5 (calc)   Total Bilirubin 0.5 0.2 - 1.2 mg/dL   Alkaline phosphatase (APISO) 72 40 - 115 U/L   AST 16 10 - 35 U/L   ALT 21 9 - 46 U/L  VITAMIN D 25 Hydroxy (Vit-D Deficiency, Fractures)     Status: Abnormal   Collection Time: 03/15/17  4:45 PM  Result Value Ref Range   Vit D, 25-Hydroxy 23 (L) 30 - 100 ng/mL    Comment: Vitamin D Status         25-OH Vitamin D: . Deficiency:                    <20 ng/mL Insufficiency:             20 - 29 ng/mL Optimal:                 > or = 30 ng/mL . For 25-OH Vitamin D testing on patients on  D2-supplementation and patients for whom quantitation  of D2 and D3 fractions is required, the QuestAssureD(TM) 25-OH VIT D, (D2,D3), LC/MS/MS is recommended: order  code [id number] [id number] (patients >36yrs). . For more information on this test, go to: http://education.quest[facility]ostics.com/faq/FAQ163 (This link is being provided for  informational/educational purposes only.)  ASSESSMENT/PLAN:   Fatigue, unspecified type - Plan: CBC, TSH, COMPLETE METABOLIC PANEL WITH GFR, VITAMIN D 25 Hydroxy (Vit-D Deficiency,  Fractures)  Prostate cancer screening - Plan: PSA, Total with Reflex to PSA, Free  Elevated fasting glucose - Plan: Hemoglobin A1c  Weight gain - Plan: CBC, Hemoglobin A1c, TSH, Lipid panel  Chronic cough  Need for influenza vaccination - Plan: Flu Vaccine QUAD 6+ mos PF IM (Fluarix Quad PF)  Elevated hemoglobin A1c - 6.9% on 03/15/2017 - new diagnosis. Plan to repeat in 3 months, we'll discuss medications at next visit  Vitamin D deficiency  Essential hypertension    Visit summary with medication list and pertinent instructions was printed for patient to review. All questions at time of visit were answered - patient instructed to contact office with any additional concerns. ER/RTC precautions were reviewed with the patient.   Follow-up plan: Return for spirometry next week or so.  Note: Total time spent 45 minutes, greater than 50% of the visit was spent face-to-face counseling and coordinating care for the following: The primary encounter diagnosis was Fatigue, unspecified type. Diagnoses of Prostate cancer screening, Elevated fasting glucose, Weight gain, Chronic cough, Need for influenza vaccination, Elevated hemoglobin A1c, Vitamin D deficiency, and Essential hypertension were also pertinent to this visit.Marland Kitchen  Please note: voice recognition software was used to produce this document, and typos may escape review. Please contact Dr. Sheppard Coil for any needed clarifications.

## 2017-03-16 DIAGNOSIS — R7309 Other abnormal glucose: Secondary | ICD-10-CM | POA: Insufficient documentation

## 2017-03-16 DIAGNOSIS — R05 Cough: Secondary | ICD-10-CM | POA: Insufficient documentation

## 2017-03-16 DIAGNOSIS — R7301 Impaired fasting glucose: Secondary | ICD-10-CM | POA: Insufficient documentation

## 2017-03-16 DIAGNOSIS — R635 Abnormal weight gain: Secondary | ICD-10-CM | POA: Insufficient documentation

## 2017-03-16 DIAGNOSIS — E559 Vitamin D deficiency, unspecified: Secondary | ICD-10-CM | POA: Insufficient documentation

## 2017-03-16 DIAGNOSIS — R5383 Other fatigue: Secondary | ICD-10-CM | POA: Insufficient documentation

## 2017-03-16 LAB — HEMOGLOBIN A1C
Hgb A1c MFr Bld: 6.9 % of total Hgb — ABNORMAL HIGH (ref <5.7)
Mean Plasma Glucose: 151 (calc)
eAG (mmol/L): 8.4 (calc)

## 2017-03-16 LAB — CBC
HCT: 43.3 % (ref 38.5–50.0)
Hemoglobin: 13.9 g/dL (ref 13.2–17.1)
MCH: 22.2 pg — ABNORMAL LOW (ref 27.0–33.0)
MCHC: 32.1 g/dL (ref 32.0–36.0)
MCV: 69.1 fL — ABNORMAL LOW (ref 80.0–100.0)
MPV: 9.9 fL (ref 7.5–12.5)
Platelets: 210 10*3/uL (ref 140–400)
RBC: 6.27 10*6/uL — ABNORMAL HIGH (ref 4.20–5.80)
RDW: 18.6 % — ABNORMAL HIGH (ref 11.0–15.0)
WBC: 11.6 10*3/uL — ABNORMAL HIGH (ref 3.8–10.8)

## 2017-03-16 LAB — COMPLETE METABOLIC PANEL WITH GFR
AG Ratio: 1.5 (calc) (ref 1.0–2.5)
ALT: 21 U/L (ref 9–46)
AST: 16 U/L (ref 10–35)
Albumin: 4.3 g/dL (ref 3.6–5.1)
Alkaline phosphatase (APISO): 72 U/L (ref 40–115)
BUN: 13 mg/dL (ref 7–25)
CO2: 30 mmol/L (ref 20–32)
Calcium: 9.1 mg/dL (ref 8.6–10.3)
Chloride: 101 mmol/L (ref 98–110)
Creat: 0.92 mg/dL (ref 0.70–1.33)
GFR, Est African American: 105 mL/min/{1.73_m2} (ref >=60)
GFR, Est Non African American: 91 mL/min/{1.73_m2} (ref >=60)
Globulin: 2.9 g/dL (calc) (ref 1.9–3.7)
Glucose, Bld: 112 mg/dL — ABNORMAL HIGH (ref 65–99)
Potassium: 4.3 mmol/L (ref 3.5–5.3)
Sodium: 136 mmol/L (ref 135–146)
Total Bilirubin: 0.5 mg/dL (ref 0.2–1.2)
Total Protein: 7.2 g/dL (ref 6.1–8.1)

## 2017-03-16 LAB — PSA, TOTAL WITH REFLEX TO PSA, FREE: PSA, Total: 1.3 ng/mL (ref <=4.0)

## 2017-03-16 LAB — VITAMIN D 25 HYDROXY (VIT D DEFICIENCY, FRACTURES): Vit D, 25-Hydroxy: 23 ng/mL — ABNORMAL LOW (ref 30–100)

## 2017-03-16 LAB — TSH: TSH: 0.86 mIU/L (ref 0.40–4.50)

## 2017-03-16 LAB — LIPID PANEL
Cholesterol: 153 mg/dL (ref <200)
HDL: 61 mg/dL (ref >40)
LDL Cholesterol (Calc): 71 mg/dL (calc)
Non-HDL Cholesterol (Calc): 92 mg/dL (calc) (ref <130)
Total CHOL/HDL Ratio: 2.5 (calc) (ref <5.0)
Triglycerides: 131 mg/dL (ref <150)

## 2017-03-22 ENCOUNTER — Ambulatory Visit (INDEPENDENT_AMBULATORY_CARE_PROVIDER_SITE_OTHER): Payer: Managed Care, Other (non HMO) | Admitting: Osteopathic Medicine

## 2017-03-22 VITALS — BP 141/81 | HR 90 | Temp 98.7°F | Resp 16 | Ht 64.0 in | Wt 203.0 lb

## 2017-03-22 DIAGNOSIS — R053 Chronic cough: Secondary | ICD-10-CM

## 2017-03-22 DIAGNOSIS — E119 Type 2 diabetes mellitus without complications: Secondary | ICD-10-CM | POA: Diagnosis not present

## 2017-03-22 DIAGNOSIS — R05 Cough: Secondary | ICD-10-CM | POA: Diagnosis not present

## 2017-03-22 HISTORY — DX: Type 2 diabetes mellitus without complications: E11.9

## 2017-03-22 MED ORDER — FLUTICASONE FUROATE-VILANTEROL 100-25 MCG/INH IN AEPB: 1.0000 | INHALATION_SPRAY | Freq: Every day | RESPIRATORY_TRACT | 1 refills | Status: DC

## 2017-03-22 MED ORDER — METFORMIN HCL ER 500 MG PO TB24: 500.0000 mg | ORAL_TABLET | Freq: Every day | ORAL | 1 refills | Status: DC

## 2017-03-22 NOTE — Progress Notes (Signed)
HPI: Justin Frank is a 62 y.o. male who  has a past medical history of Asthma, Controlled type 2 diabetes mellitus without complication, without long-term current use of insulin ([place]) (03/22/2017), ED (erectile dysfunction), Hyperlipidemia, and Hypertension.  he presents to [facility] today, 03/22/17,  for chief complaint of:  Chief Complaint  Patient presents with  . Lung function tests, review lab work     Main concern today is heartburn and would like thyroid check.  Chronic cough: Has been ongoing for at least 6 months or so. No history of COPD or recent tobacco use, he quit smoking about 40 years ago. Describes as a dry cough, nagging, no coughing of blood, no significant shortness of breath. He does have a history of some asbestos exposure when he was younger. Mediastinal lymphadenopathy incidental finding on CT for treatment/workup of bronchitis. He is planning on repeat chest CT with hematology. He is here today for spirometry, nurse came to grab me after he had a coughing fit with his first attempt at blowing out to measure lung opacities/FEV1, during the coughing fit he seemed to "glazed over" and get a bit confused but came right back. She was nervous to continue without talking to me first, reasonably so.  Elevated hemoglobin A1c: Into the diabetic range at this point. We discussed diet/medication as well as lifestyle changes such as exercise  HTN: No home blood pressures to report. No chest pain, pressure, shortness of breath.  HLD: on atorvastatin.   Thrombocytopenia/Anemia: Following with [facility] in [place]. Dr. Everardo All. Last seen 02/15/2017 - thalassemia trait was suspected, platelets possibly related to acute probable viral illness. Consider CT scan     Patient is accompanied by wife who assists with history-taking.   Past medical, surgical, social and family history reviewed:  Patient Active Problem List    Diagnosis Date Noted  . Controlled type 2 diabetes mellitus without complication, without long-term current use of insulin ([place]) 03/22/2017  . Fatigue 03/16/2017  . Elevated fasting glucose 03/16/2017  . Weight gain 03/16/2017  . Chronic cough 03/16/2017  . Elevated hemoglobin A1c 03/16/2017  . Vitamin D deficiency 03/16/2017  . Class 2 obesity in adult 02/15/2017  . Lymphadenopathy, mediastinal 02/15/2017  . RBC microcytosis 02/15/2017  . Thrombocytopenia ([place]) 02/15/2017  . Abnormal EKG 05/15/2014  . Atypical chest pain 05/15/2014  . Essential hypertension 05/15/2014  . Hyperlipidemia 05/15/2014  . Recurrent umbilical hernia 21/30/8657    Past Surgical History:  Procedure Laterality Date  . HERNIA REPAIR     As per patient - 2012, 2013, 2015    Social History   Tobacco Use  . Smoking status: Former Smoker    Last attempt to quit: 03/02/1977    Years since quitting: 40.0  . Smokeless tobacco: Never Used  Substance Use Topics  . Alcohol use: No    Family History  Problem Relation Age of Onset  . Hypertension Mother   . Hypertension Father      Current medication list and allergy/intolerance information reviewed:  No updates needed, see below for new meds ordered today   Allergies  Allergen Reactions  . Atorvastatin Swelling    " lip swelling " " lip swelling "   . Lisinopril Cough    Other reaction(s): Cough (ALLERGY/intolerance)      Review of Systems:  Constitutional:  No  fever, no chills, No recent illness, No unintentional weight changes  HEENT: No  headache, no vision change  Cardiac: No  chest pain, No  pressure  Respiratory:  No  shortness of breath. +Cough  Gastrointestinal: No  abdominal pain, No  nausea, No  vomiting,  No  blood in stool, No  diarrhea, No  constipation   Skin: No  Rash  Neurologic: No  weakness, No  dizziness, No  slurred speech/focal weakness/facial droop  Psychiatric: No  concerns with depression, No  concerns with  anxiety  Exam:  BP (!) 141/81 (BP Location: Left Arm, Patient Position: Sitting, Cuff Size: Large)   Pulse 90   Temp 98.7 F (37.1 C) (Oral)   Resp 16   Ht 5\' 4"  (1.626 m)   Wt 203 lb (92.1 kg)   SpO2 100%   BMI 34.84 kg/m   Constitutional: VS see above. General Appearance: alert, well-developed, well-nourished, NAD  Eyes: Normal lids and conjunctive, non-icteric sclera  Ears, Nose, Mouth, Throat: MMM, Normal external inspection ears/nares/mouth/lips/gums.   Neck: No masses, trachea midline. No thyroid enlargement.   Respiratory: Normal respiratory effort. no wheeze, no rhonchi, no rales  Cardiovascular: S1/S2 normal, no murmur, no rub/gallop auscultated. RRR. No edema  Musculoskeletal: Gait normal.  Neurological: Normal balance/coordination. No tremor.    Psychiatric: Normal judgment/insight. Normal mood and affect. Oriented x3.      ASSESSMENT/PLAN: The primary encounter diagnosis was Chronic cough. A diagnosis of Controlled type 2 diabetes mellitus without complication, without long-term current use of insulin (HCC) was also pertinent to this visit.  We've tried treating for postnasal drip/sinusitis, GERD. Treating for COPD or possible cough variant asthma is reasonable, patient is understandably nervous to continue with spirometry today. I think we can go ahead and start an inhaler and see how he does. Strong consideration for pulmonology referral if he doesn't improve with ICS/LABA    Meds ordered this encounter  Medications  . metFORMIN (GLUCOPHAGE XR) 500 MG 24 hr tablet    Sig: Take 1 tablet (500 mg total) by mouth daily with breakfast.    Dispense:  90 tablet    Refill:  1  . fluticasone furoate-vilanterol (BREO ELLIPTA) 100-25 MCG/INH AEPB    Sig: Inhale 1 puff into the lungs daily.    Dispense:  28 each    Refill:  1     Visit summary with medication list and pertinent instructions was printed for patient to review. All questions at time of visit were  answered - patient instructed to contact office with any additional concerns. ER/RTC precautions were reviewed with the patient.   Follow-up plan: Return for recheck breathing on new inhaler in 2-4 weeks, sooner if needed.  Note: Total time spent 45 minutes, greater than 50% of the visit was spent face-to-face counseling and coordinating care for the following: The primary encounter diagnosis was Chronic cough. A diagnosis of Controlled type 2 diabetes mellitus without complication, without long-term current use of insulin (HCC) was also pertinent to this visit.Marland Kitchen  Please note: voice recognition software was used to produce this document, and typos may escape review. Please contact Dr. Sheppard Coil for any needed clarifications.

## 2017-03-22 NOTE — Patient Instructions (Signed)
Diabetes Mellitus and Nutrition When you have diabetes (diabetes mellitus), it is very important to have healthy eating habits because your blood sugar (glucose) levels are greatly affected by what you eat and drink. Eating healthy foods in the appropriate amounts, at about the same times every day, can help you:  Control your blood glucose.  Lower your risk of heart disease.  Improve your blood pressure.  Reach or maintain a healthy weight.  Every person with diabetes is different, and each person has different needs for a meal plan. Your health care provider may recommend that you work with a diet and nutrition specialist (dietitian) to make a meal plan that is best for you. Your meal plan may vary depending on factors such as:  The calories you need.  The medicines you take.  Your weight.  Your blood glucose, blood pressure, and cholesterol levels.  Your activity level.  Other health conditions you have, such as heart or kidney disease.  How do carbohydrates affect me? Carbohydrates affect your blood glucose level more than any other type of food. Eating carbohydrates naturally increases the amount of glucose in your blood. Carbohydrate counting is a method for keeping track of how many carbohydrates you eat. Counting carbohydrates is important to keep your blood glucose at a healthy level, especially if you use insulin or take certain oral diabetes medicines. It is important to know how many carbohydrates you can safely have in each meal. This is different for every person. Your dietitian can help you calculate how many carbohydrates you should have at each meal and for snack. Foods that contain carbohydrates include:  Bread, cereal, rice, pasta, and crackers.  Potatoes and corn.  Peas, beans, and lentils.  Milk and yogurt.  Fruit and juice.  Desserts, such as cakes, cookies, ice cream, and candy.  How does alcohol affect me? Alcohol can cause a sudden decrease in blood  glucose (hypoglycemia), especially if you use insulin or take certain oral diabetes medicines. Hypoglycemia can be a life-threatening condition. Symptoms of hypoglycemia (sleepiness, dizziness, and confusion) are similar to symptoms of having too much alcohol. If your health care provider says that alcohol is safe for you, follow these guidelines:  Limit alcohol intake to no more than 1 drink per day for nonpregnant women and 2 drinks per day for men. One drink equals 12 oz of beer, 5 oz of wine, or 1 oz of hard liquor.  Do not drink on an empty stomach.  Keep yourself hydrated with water, diet soda, or unsweetened iced tea.  Keep in mind that regular soda, juice, and other mixers may contain a lot of sugar and must be counted as carbohydrates.  What are tips for following this plan? Reading food labels  Start by checking the serving size on the label. The amount of calories, carbohydrates, fats, and other nutrients listed on the label are based on one serving of the food. Many foods contain more than one serving per package.  Check the total grams (g) of carbohydrates in one serving. You can calculate the number of servings of carbohydrates in one serving by dividing the total carbohydrates by 15. For example, if a food has 30 g of total carbohydrates, it would be equal to 2 servings of carbohydrates.  Check the number of grams (g) of saturated and trans fats in one serving. Choose foods that have low or no amount of these fats.  Check the number of milligrams (mg) of sodium in one serving. Most people   should limit total sodium intake to less than 2,300 mg per day.  Always check the nutrition information of foods labeled as "low-fat" or "nonfat". These foods may be higher in added sugar or refined carbohydrates and should be avoided.  Talk to your dietitian to identify your daily goals for nutrients listed on the label. Shopping  Avoid buying canned, premade, or processed foods. These  foods tend to be high in fat, sodium, and added sugar.  Shop around the outside edge of the grocery store. This includes fresh fruits and vegetables, bulk grains, fresh meats, and fresh dairy. Cooking  Use low-heat cooking methods, such as baking, instead of high-heat cooking methods like deep frying.  Cook using healthy oils, such as olive, canola, or sunflower oil.  Avoid cooking with butter, cream, or high-fat meats. Meal planning  Eat meals and snacks regularly, preferably at the same times every day. Avoid going long periods of time without eating.  Eat foods high in fiber, such as fresh fruits, vegetables, beans, and whole grains. Talk to your dietitian about how many servings of carbohydrates you can eat at each meal.  Eat 4-6 ounces of lean protein each day, such as lean meat, chicken, fish, eggs, or tofu. 1 ounce is equal to 1 ounce of meat, chicken, or fish, 1 egg, or 1/4 cup of tofu.  Eat some foods each day that contain healthy fats, such as avocado, nuts, seeds, and fish. Lifestyle   Check your blood glucose regularly.  Exercise at least 30 minutes 5 or more days each week, or as told by your health care provider.  Take medicines as told by your health care provider.  Do not use any products that contain nicotine or tobacco, such as cigarettes and e-cigarettes. If you need help quitting, ask your health care provider.  Work with a counselor or diabetes educator to identify strategies to manage stress and any emotional and social challenges. What are some questions to ask my health care provider?  Do I need to meet with a diabetes educator?  Do I need to meet with a dietitian?  What number can I call if I have questions?  When are the best times to check my blood glucose? Where to find more information:  American Diabetes Association: diabetes.org/food-and-fitness/food  Academy of Nutrition and Dietetics:  www.eatright.org/resources/health/diseases-and-conditions/diabetes  National Institute of Diabetes and Digestive and Kidney Diseases (NIH): www.niddk.nih.gov/health-information/diabetes/overview/diet-eating-physical-activity Summary  A healthy meal plan will help you control your blood glucose and maintain a healthy lifestyle.  Working with a diet and nutrition specialist (dietitian) can help you make a meal plan that is best for you.  Keep in mind that carbohydrates and alcohol have immediate effects on your blood glucose levels. It is important to count carbohydrates and to use alcohol carefully. This information is not intended to replace advice given to you by your health care provider. Make sure you discuss any questions you have with your health care provider. Document Released: 11/13/2004 Document Revised: 03/23/2016 Document Reviewed: 03/23/2016 Elsevier Interactive Patient Education  2018 Elsevier Inc.  

## 2017-04-05 ENCOUNTER — Ambulatory Visit (INDEPENDENT_AMBULATORY_CARE_PROVIDER_SITE_OTHER): Payer: Managed Care, Other (non HMO) | Admitting: Osteopathic Medicine

## 2017-04-05 ENCOUNTER — Encounter: Payer: Self-pay | Admitting: Osteopathic Medicine

## 2017-04-05 VITALS — BP 144/88 | HR 103 | Temp 98.2°F | Wt 210.0 lb

## 2017-04-05 DIAGNOSIS — R053 Chronic cough: Secondary | ICD-10-CM

## 2017-04-05 DIAGNOSIS — R05 Cough: Secondary | ICD-10-CM | POA: Diagnosis not present

## 2017-04-05 NOTE — Progress Notes (Signed)
HPI: Justin Frank is a 62 y.o. male who  has a past medical history of Asthma, Controlled type 2 diabetes mellitus without complication, without long-term current use of insulin ([place]) (03/22/2017), ED (erectile dysfunction), Hyperlipidemia, and Hypertension.  he presents to [facility] today, 04/05/17,  for chief complaint of: Chronic cough  Last seen 03/22/17 cough x6 mos, unable to complete PFT (vasovagal episode during the test) so we went ahead and tried Breo inhaler and he is here today reporting improvement on this   Patient is accompanied by wife who assists with history-taking.   Past medical, surgical, social and family history reviewed:  Patient Active Problem List   Diagnosis Date Noted  . Controlled type 2 diabetes mellitus without complication, without long-term current use of insulin ([place]) 03/22/2017  . Fatigue 03/16/2017  . Elevated fasting glucose 03/16/2017  . Weight gain 03/16/2017  . Chronic cough 03/16/2017  . Elevated hemoglobin A1c 03/16/2017  . Vitamin D deficiency 03/16/2017  . Class 2 obesity in adult 02/15/2017  . Lymphadenopathy, mediastinal 02/15/2017  . RBC microcytosis 02/15/2017  . Thrombocytopenia ([place]) 02/15/2017  . Abnormal EKG 05/15/2014  . Atypical chest pain 05/15/2014  . Essential hypertension 05/15/2014  . Hyperlipidemia 05/15/2014  . Recurrent umbilical hernia 59/56/3875    Past Surgical History:  Procedure Laterality Date  . HERNIA REPAIR     As per patient - 2012, 2013, 2015    Social History   Tobacco Use  . Smoking status: Former Smoker    Last attempt to quit: 03/02/1977    Years since quitting: 40.1  . Smokeless tobacco: Never Used  Substance Use Topics  . Alcohol use: No    Family History  Problem Relation Age of Onset  . Hypertension Mother   . Hypertension Father      Current medication list and allergy/intolerance information reviewed:    Current Outpatient Medications   Medication Sig Dispense Refill  . amLODipine (NORVASC) 10 MG tablet TK 1 T PO QD  4  . atorvastatin (LIPITOR) 10 MG tablet     . cetirizine (ZYRTEC) 10 MG tablet Take by mouth.    . cyclobenzaprine (FLEXERIL) 10 MG tablet Take 1 tablet (10 mg total) 2 (two) times daily as needed by mouth for muscle spasms. 20 tablet 0  . fluticasone (FLONASE) 50 MCG/ACT nasal spray Place into the nose.    . fluticasone furoate-vilanterol (BREO ELLIPTA) 100-25 MCG/INH AEPB Inhale 1 puff into the lungs daily. 28 each 1  . hydrOXYzine (ATARAX/VISTARIL) 25 MG tablet Take 1 tablet (25 mg total) by mouth every 6 (six) hours. 12 tablet 0  . metFORMIN (GLUCOPHAGE XR) 500 MG 24 hr tablet Take 1 tablet (500 mg total) by mouth daily with breakfast. 90 tablet 1  . omeprazole (PRILOSEC) 40 MG capsule Take 1 capsule (40 mg total) by mouth daily. 90 capsule 0   No current facility-administered medications for this visit.     Allergies  Allergen Reactions  . Atorvastatin Swelling    " lip swelling " " lip swelling "   . Lisinopril Cough    Other reaction(s): Cough (ALLERGY/intolerance)      Review of Systems:  Constitutional:  No  fever, no chills  Cardiac: No  chest pain, No  pressure, No palpitations, No  Orthopnea  Respiratory:  No  shortness of breath. No  Cough  Gastrointestinal: No  abdominal pain, No  nausea,   Exam:  BP (!) 144/88   Pulse Marland Kitchen)  103   Temp 98.2 F (36.8 C) (Oral)   Wt 210 lb (95.3 kg)   BMI 36.05 kg/m   Constitutional: VS see above. General Appearance: alert, well-developed, well-nourished, NAD  Eyes: Normal lids and conjunctive, non-icteric sclera  Ears, Nose, Mouth, Throat: MMM, Normal external inspection ears/nares/mouth/lips/gums  Neck: No masses, trachea midline.   Respiratory: Normal respiratory effort. no wheeze, no rhonchi, no rales  Cardiovascular: S1/S2 normal, no murmur, no rub/gallop auscultated. RRR.      ASSESSMENT/PLAN:   Chronic cough - improved on  Brio, question unspecified reactive airway disease, okay to continue medications for now. Consider pulmonary referral. He has an upcoming CT to follow-up on some hilar lymphadenopathy, sent myself or reminder to look for this once it is done so I can assess results.      Visit summary with medication list and pertinent instructions was printed for patient to review. All questions at time of visit were answered - patient instructed to contact office with any additional concerns. ER/RTC precautions were reviewed with the patient.   Follow-up plan: Return for recheck sugars (A1C) around 06/13/17 .    Please note: voice recognition software was used to produce this document, and typos may escape review. Please contact Dr. Sheppard Coil for any needed clarifications.

## 2017-04-18 ENCOUNTER — Other Ambulatory Visit: Payer: Self-pay | Admitting: Osteopathic Medicine

## 2017-04-21 ENCOUNTER — Ambulatory Visit (INDEPENDENT_AMBULATORY_CARE_PROVIDER_SITE_OTHER): Payer: Managed Care, Other (non HMO) | Admitting: Osteopathic Medicine

## 2017-04-21 ENCOUNTER — Encounter: Payer: Self-pay | Admitting: Osteopathic Medicine

## 2017-04-21 ENCOUNTER — Ambulatory Visit (INDEPENDENT_AMBULATORY_CARE_PROVIDER_SITE_OTHER): Payer: Managed Care, Other (non HMO)

## 2017-04-21 VITALS — BP 157/94 | HR 94 | Temp 98.3°F | Wt 209.1 lb

## 2017-04-21 DIAGNOSIS — J920 Pleural plaque with presence of asbestos: Secondary | ICD-10-CM | POA: Diagnosis not present

## 2017-04-21 DIAGNOSIS — J479 Bronchiectasis, uncomplicated: Secondary | ICD-10-CM

## 2017-04-21 DIAGNOSIS — R05 Cough: Secondary | ICD-10-CM | POA: Diagnosis not present

## 2017-04-21 DIAGNOSIS — R053 Chronic cough: Secondary | ICD-10-CM

## 2017-04-21 MED ORDER — ALBUTEROL SULFATE HFA 108 (90 BASE) MCG/ACT IN AERS: 2.0000 | INHALATION_SPRAY | Freq: Four times a day (QID) | RESPIRATORY_TRACT | 11 refills | Status: DC | PRN

## 2017-04-21 MED ORDER — AZITHROMYCIN 250 MG PO TABS: ORAL_TABLET | ORAL | 0 refills | Status: DC

## 2017-04-21 NOTE — Patient Instructions (Addendum)
Bronchiectasis - widening of the lung airways, can cause mucus and predispose to infection.

## 2017-04-21 NOTE — Progress Notes (Signed)
HPI: Justin Frank is a 62 y.o. male who  has a past medical history of Asthma, Controlled type 2 diabetes mellitus without complication, without long-term current use of insulin ([place]) (03/22/2017), ED (erectile dysfunction), Hyperlipidemia, and Hypertension.  he presents to [facility] today, 04/21/17,  for chief complaint of: Chronic cough  Seen 03/22/17 cough x6 mos, unable to complete PFT (vasovagal episode during the test) so we went ahead and tried Breo inhaler and he is here today reporting improvement on this. We saw him again on 04/05/17 and was improved on Breo. CT result (following for mediastinal lymphadenopathy w/ HemOnc showed concern for asbestos exposure, bronchiectasis w/o PNA/bronchitis.   Patient is accompanied by wife who assists with history-taking.   Past medical, surgical, social and family history reviewed:  Patient Active Problem List   Diagnosis Date Noted  . Controlled type 2 diabetes mellitus without complication, without long-term current use of insulin ([place]) 03/22/2017  . Fatigue 03/16/2017  . Elevated fasting glucose 03/16/2017  . Weight gain 03/16/2017  . Chronic cough 03/16/2017  . Elevated hemoglobin A1c 03/16/2017  . Vitamin D deficiency 03/16/2017  . Class 2 obesity in adult 02/15/2017  . Lymphadenopathy, mediastinal 02/15/2017  . RBC microcytosis 02/15/2017  . Thrombocytopenia ([place]) 02/15/2017  . Abnormal EKG 05/15/2014  . Atypical chest pain 05/15/2014  . Essential hypertension 05/15/2014  . Hyperlipidemia 05/15/2014  . Recurrent umbilical hernia 70/35/0093    Past Surgical History:  Procedure Laterality Date  . HERNIA REPAIR     As per patient - 2012, 2013, 2015    Social History   Tobacco Use  . Smoking status: Former Smoker    Last attempt to quit: 03/02/1977    Years since quitting: 40.1  . Smokeless tobacco: Never Used  Substance Use Topics  . Alcohol use: No    Family History  Problem  Relation Age of Onset  . Hypertension Mother   . Hypertension Father      Current medication list and allergy/intolerance information reviewed:    Current Outpatient Medications  Medication Sig Dispense Refill  . amLODipine (NORVASC) 10 MG tablet TK 1 T PO QD  4  . atorvastatin (LIPITOR) 10 MG tablet     . cetirizine (ZYRTEC) 10 MG tablet Take by mouth.    . cyclobenzaprine (FLEXERIL) 10 MG tablet Take 1 tablet (10 mg total) 2 (two) times daily as needed by mouth for muscle spasms. 20 tablet 0  . fluticasone (FLONASE) 50 MCG/ACT nasal spray SHAKE LIQUID AND USE 1 SPRAY IN EACH NOSTRIL EVERY DAY 16 g 0  . fluticasone furoate-vilanterol (BREO ELLIPTA) 100-25 MCG/INH AEPB Inhale 1 puff into the lungs daily. 28 each 1  . hydrOXYzine (ATARAX/VISTARIL) 25 MG tablet Take 1 tablet (25 mg total) by mouth every 6 (six) hours. 12 tablet 0  . metFORMIN (GLUCOPHAGE XR) 500 MG 24 hr tablet Take 1 tablet (500 mg total) by mouth daily with breakfast. 90 tablet 1  . omeprazole (PRILOSEC) 40 MG capsule Take 1 capsule (40 mg total) by mouth daily. 90 capsule 0   No current facility-administered medications for this visit.     Allergies  Allergen Reactions  . Atorvastatin Swelling    " lip swelling " " lip swelling "   . Lisinopril Cough    Other reaction(s): Cough (ALLERGY/intolerance)      Review of Systems:  Constitutional:  No  fever, no chills  Cardiac: No  chest pain, No  pressure, No palpitations,  No  Orthopnea  Respiratory:  No  shortness of breath. +Cough  Gastrointestinal: No  abdominal pain, No  nausea,   Exam:  BP (!) 157/94 (BP Location: Right Arm)   Pulse 94   Temp 98.3 F (36.8 C) (Oral)   Wt 209 lb 1.9 oz (94.9 kg)   SpO2 96%   BMI 35.90 kg/m   Constitutional: VS see above. General Appearance: alert, well-developed, well-nourished, NAD  Eyes: Normal lids and conjunctive, non-icteric sclera  Ears, Nose, Mouth, Throat: MMM, Normal external inspection  ears/nares/mouth/lips/gums  Neck: No masses, trachea midline.   Respiratory: Normal respiratory effort. no wheeze, no rhonchi, no rales  Cardiovascular: S1/S2 normal, no murmur, no rub/gallop auscultated. RRR.      ASSESSMENT/PLAN:   No diagnosis found. - improved on Brio, question unspecified reactive airway disease vs bronchiectasis complication. Ordered pulmonary referral. Added rescue inhaler, CXR pending would consider levaquin if obvious PNA on XR    Patient Instructions  Bronchiectasis - widening of the lung airways, can cause mucus and predispose to infection.        Visit summary with medication list and pertinent instructions was printed for patient to review. All questions at time of visit were answered - patient instructed to contact office with any additional concerns. ER/RTC precautions were reviewed with the patient.   Follow-up plan: Return in about 4 weeks (around 05/19/2017) for recheck BP (nurse visit) .    Please note: voice recognition software was used to produce this document, and typos may escape review. Please contact Dr. Sheppard Coil for any needed clarifications.

## 2017-04-23 ENCOUNTER — Encounter (HOSPITAL_BASED_OUTPATIENT_CLINIC_OR_DEPARTMENT_OTHER): Payer: Self-pay | Admitting: *Deleted

## 2017-04-23 ENCOUNTER — Other Ambulatory Visit: Payer: Self-pay

## 2017-04-23 DIAGNOSIS — Z87891 Personal history of nicotine dependence: Secondary | ICD-10-CM | POA: Diagnosis not present

## 2017-04-23 DIAGNOSIS — E119 Type 2 diabetes mellitus without complications: Secondary | ICD-10-CM | POA: Insufficient documentation

## 2017-04-23 DIAGNOSIS — Z79899 Other long term (current) drug therapy: Secondary | ICD-10-CM | POA: Diagnosis not present

## 2017-04-23 DIAGNOSIS — R05 Cough: Secondary | ICD-10-CM | POA: Diagnosis present

## 2017-04-23 DIAGNOSIS — Z7984 Long term (current) use of oral hypoglycemic drugs: Secondary | ICD-10-CM | POA: Insufficient documentation

## 2017-04-23 DIAGNOSIS — I10 Essential (primary) hypertension: Secondary | ICD-10-CM | POA: Diagnosis not present

## 2017-04-23 DIAGNOSIS — J209 Acute bronchitis, unspecified: Secondary | ICD-10-CM | POA: Diagnosis not present

## 2017-04-23 DIAGNOSIS — J45909 Unspecified asthma, uncomplicated: Secondary | ICD-10-CM | POA: Diagnosis not present

## 2017-04-23 NOTE — ED Triage Notes (Signed)
Cough and body aches that comes and goes x 3 months. His MD is aware. He is not improving with antibiotics.

## 2017-04-24 ENCOUNTER — Emergency Department (HOSPITAL_BASED_OUTPATIENT_CLINIC_OR_DEPARTMENT_OTHER)
Admission: EM | Admit: 2017-04-24 | Discharge: 2017-04-24 | Disposition: A | Discharge status: Home / Self Care | Payer: Managed Care, Other (non HMO) | Attending: Emergency Medicine | Admitting: Emergency Medicine

## 2017-04-24 DIAGNOSIS — J209 Acute bronchitis, unspecified: Secondary | ICD-10-CM

## 2017-04-24 MED ORDER — HYDROCOD POLST-CPM POLST ER 10-8 MG/5ML PO SUER
5.0000 mL | Freq: Once | ORAL | Status: AC
  Administered 2017-04-24: 5 mL via ORAL
  Filled 2017-04-24: qty 5

## 2017-04-24 MED ORDER — HYDROCOD POLST-CPM POLST ER 10-8 MG/5ML PO SUER: 5.0000 mL | Freq: Two times a day (BID) | ORAL | 0 refills | Status: DC | PRN

## 2017-04-24 MED ORDER — IPRATROPIUM-ALBUTEROL 0.5-2.5 (3) MG/3ML IN SOLN
3.0000 mL | RESPIRATORY_TRACT | Status: DC
Start: 2017-04-24 — End: 2017-04-24
  Administered 2017-04-24: 3 mL via RESPIRATORY_TRACT
  Filled 2017-04-24: qty 3

## 2017-04-24 NOTE — ED Notes (Signed)
Pt updated with plan of care and reason for delay

## 2017-04-24 NOTE — ED Provider Notes (Signed)
[facility] Provider Note: Georgena Spurling, MD, FACEP  CSN: [id number] MRN: [id number] ARRIVAL: 04/23/17 at 2215 ROOM: [place]  Cough   HISTORY OF PRESENT ILLNESS  04/24/17 12:38 AM Justin Frank is a 62 y.o. male with about a 3-week history of cough.  The cough is nonproductive and persistent.  It is sometimes associated with shortness of breath.  He has been seen by his PCP and was treated with Zithromax as well as an inhaler.  He has not gotten relief from these.  He denies fever or other cold symptoms such as nasal congestion or rhinorrhea.  He does have a scratchy throat which he attributes to coughing.  He is having chest wall soreness associated with the cough.  A chest x-ray done 3 days ago was unremarkable.   Past Medical History:  Diagnosis Date  . Asthma   . Controlled type 2 diabetes mellitus without complication, without long-term current use of insulin ([place]) 03/22/2017  . ED (erectile dysfunction)   . Hyperlipidemia   . Hypertension     Past Surgical History:  Procedure Laterality Date  . HERNIA REPAIR     As per patient - 2012, 2013, 2015    Family History  Problem Relation Age of Onset  . Hypertension Mother   . Hypertension Father     Social History   Tobacco Use  . Smoking status: Former Smoker    Last attempt to quit: 03/02/1977    Years since quitting: 40.1  . Smokeless tobacco: Never Used  Substance Use Topics  . Alcohol use: No  . Drug use: No    Prior to Admission medications   Medication Sig Start Date End Date Taking? Authorizing Provider  albuterol (PROVENTIL HFA;VENTOLIN HFA) 108 (90 Base) MCG/ACT inhaler Inhale 2 puffs into the lungs every 6 (six) hours as needed for wheezing. 04/21/17   Emeterio Reeve, DO  amLODipine (NORVASC) 10 MG tablet TK 1 T PO QD 02/13/17   [provider]  atorvastatin (LIPITOR) 10 MG tablet  03/08/17   [provider]  azithromycin (ZITHROMAX) 250 MG tablet  2 tabs po x1 on Day 1, then 1 tab po daily on Days 2 - 5. Fill if cough worse despite inhaler treatment 04/21/17   Emeterio Reeve, DO  cetirizine (ZYRTEC) 10 MG tablet Take by mouth. 05/09/16   [provider]  cyclobenzaprine (FLEXERIL) 10 MG tablet Take 1 tablet (10 mg total) 2 (two) times daily as needed by mouth for muscle spasms. 01/11/17   Tegeler, Gwenyth Allegra, MD  fluticasone (FLONASE) 50 MCG/ACT nasal spray SHAKE LIQUID AND USE 1 SPRAY IN EACH NOSTRIL EVERY DAY 04/19/17   Emeterio Reeve, DO  fluticasone furoate-vilanterol (BREO ELLIPTA) 100-25 MCG/INH AEPB Inhale 1 puff into the lungs daily. 03/22/17   Emeterio Reeve, DO  hydrOXYzine (ATARAX/VISTARIL) 25 MG tablet Take 1 tablet (25 mg total) by mouth every 6 (six) hours. 02/28/17   Dorie Rank, MD  metFORMIN (GLUCOPHAGE XR) 500 MG 24 hr tablet Take 1 tablet (500 mg total) by mouth daily with breakfast. 03/22/17 03/22/18  Emeterio Reeve, DO  omeprazole (PRILOSEC) 40 MG capsule Take 1 capsule (40 mg total) by mouth daily. 03/15/17   Emeterio Reeve, DO    Allergies Atorvastatin and Lisinopril   REVIEW OF SYSTEMS  Negative except as noted here or in the History of Present Illness.   PHYSICAL EXAMINATION  Initial Vital Signs Blood pressure (!) 142/79, pulse 97, temperature 98.6 F (37  C), temperature source Oral, resp. rate 16, height 5\' 4"  (1.626 m), weight 94.8 kg (209 lb), SpO2 99 %.  Examination General: Well-developed, well-nourished male in no acute distress; appearance consistent with age of record HENT: normocephalic; atraumatic Eyes: pupils equal, round and reactive to light; extraocular muscles intact Neck: supple Heart: regular rate and rhythm Lungs: clear to auscultation bilaterally; persistent cough Abdomen: soft; nondistended; nontender; bowel sounds present Extremities: No deformity; full range of motion; pulses normal Neurologic: Awake, alert and oriented; motor function intact in all  extremities and symmetric; no facial droop Skin: Warm and dry Psychiatric: Normal mood and affect   RESULTS  Summary of this visit's results, reviewed by myself:   EKG Interpretation  Date/Time:    Ventricular Rate:    PR Interval:    QRS Duration:   QT Interval:    QTC Calculation:   R Axis:     Text Interpretation:        Laboratory Studies: No results found for this or any previous visit (from the past 24 hour(s)). Imaging Studies: No results found.  ED COURSE  Nursing notes and initial vitals signs, including pulse oximetry, reviewed.  Vitals:   04/23/17 2226 04/23/17 2227 04/24/17 0108  BP: (!) 142/79    Pulse: 97    Resp: 16    Temp: 98.6 F (37 C)    TempSrc: Oral    SpO2: 99%  99%  Weight:  94.8 kg (209 lb)   Height:  5\' 4"  (1.626 m)    2:31 AM Cough significantly improved after DuoNeb treatment.  Patient states his breathing is easier.  Patient was given an AeroChamber and instructed in its use by respiratory therapy.  PROCEDURES    ED DIAGNOSES     ICD-10-CM   1. Acute bronchitis with bronchospasm J20.9        Taraneh Metheney, MD 04/24/17 [phone]

## 2017-04-27 ENCOUNTER — Telehealth: Payer: Self-pay

## 2017-04-27 MED ORDER — PREDNISONE 20 MG PO TABS: 20.0000 mg | ORAL_TABLET | Freq: Two times a day (BID) | ORAL | 0 refills | Status: DC

## 2017-04-27 NOTE — Telephone Encounter (Signed)
Please call patient back:   Has he heard back about referral to pulmonology? He can call [facility] at [phone], I sent a referral 04/21/2017.   I'm okay to send a burst of steroids but not sure that this is really in his best interest if he is seeing pulmonology soon, as the steroids may affect his lung function tests there or any other testing that they may have planned. I would have him call to check with them about an appointment before he picks up the medication but I went ahead and sent it to pharmacy on file [facility] in [place]  Any other issues will require an appointment here. Looks like he was seen in the emergency room for this a few days ago and he should also be on a nebulizer

## 2017-04-27 NOTE — Telephone Encounter (Signed)
Justin Frank called and left a message stating he still has a cough. He requested a round of prednisone. Please advise.

## 2017-04-28 ENCOUNTER — Ambulatory Visit (INDEPENDENT_AMBULATORY_CARE_PROVIDER_SITE_OTHER): Payer: Managed Care, Other (non HMO) | Admitting: Family Medicine

## 2017-04-28 DIAGNOSIS — J479 Bronchiectasis, uncomplicated: Secondary | ICD-10-CM | POA: Insufficient documentation

## 2017-04-28 DIAGNOSIS — J929 Pleural plaque without asbestos: Secondary | ICD-10-CM | POA: Insufficient documentation

## 2017-04-28 DIAGNOSIS — R05 Cough: Secondary | ICD-10-CM

## 2017-04-28 DIAGNOSIS — R053 Chronic cough: Secondary | ICD-10-CM

## 2017-04-28 DIAGNOSIS — J4520 Mild intermittent asthma, uncomplicated: Secondary | ICD-10-CM | POA: Insufficient documentation

## 2017-04-28 NOTE — Progress Notes (Signed)
Issue already addressed with PCP. Appointment canceled.

## 2017-04-28 NOTE — Telephone Encounter (Signed)
Pt advised in office today.  

## 2017-04-29 DIAGNOSIS — Z77090 Contact with and (suspected) exposure to asbestos: Secondary | ICD-10-CM | POA: Insufficient documentation

## 2017-04-29 HISTORY — DX: Contact with and (suspected) exposure to asbestos: Z77.090

## 2017-05-03 ENCOUNTER — Telehealth: Payer: Self-pay | Admitting: Osteopathic Medicine

## 2017-05-03 NOTE — Telephone Encounter (Signed)
ERROR

## 2017-05-19 ENCOUNTER — Ambulatory Visit: Payer: Managed Care, Other (non HMO)

## 2017-05-21 ENCOUNTER — Other Ambulatory Visit: Payer: Self-pay | Admitting: Osteopathic Medicine

## 2017-05-26 ENCOUNTER — Telehealth: Payer: Self-pay

## 2017-05-26 ENCOUNTER — Other Ambulatory Visit: Payer: Self-pay

## 2017-05-26 ENCOUNTER — Encounter: Payer: Self-pay | Admitting: Osteopathic Medicine

## 2017-05-26 ENCOUNTER — Ambulatory Visit (INDEPENDENT_AMBULATORY_CARE_PROVIDER_SITE_OTHER): Payer: Managed Care, Other (non HMO) | Admitting: Osteopathic Medicine

## 2017-05-26 VITALS — BP 158/93 | HR 87 | Temp 98.1°F | Wt 206.0 lb

## 2017-05-26 DIAGNOSIS — R0781 Pleurodynia: Secondary | ICD-10-CM

## 2017-05-26 DIAGNOSIS — R05 Cough: Secondary | ICD-10-CM | POA: Diagnosis not present

## 2017-05-26 DIAGNOSIS — M940 Chondrocostal junction syndrome [Tietze]: Secondary | ICD-10-CM

## 2017-05-26 DIAGNOSIS — R053 Chronic cough: Secondary | ICD-10-CM

## 2017-05-26 MED ORDER — CETIRIZINE HCL 10 MG PO TABS: 10.0000 mg | ORAL_TABLET | Freq: Every day | ORAL | 3 refills | Status: DC

## 2017-05-26 MED ORDER — CYCLOBENZAPRINE HCL 10 MG PO TABS: 5.0000 mg | ORAL_TABLET | Freq: Three times a day (TID) | ORAL | 0 refills | Status: DC | PRN

## 2017-05-26 MED ORDER — NAPROXEN 500 MG PO TABS: 500.0000 mg | ORAL_TABLET | Freq: Two times a day (BID) | ORAL | 0 refills | Status: DC

## 2017-05-26 MED ORDER — KETOROLAC TROMETHAMINE 60 MG/2ML IM SOLN
60.0000 mg | Freq: Once | INTRAMUSCULAR | Status: AC
  Administered 2017-05-26: 60 mg via INTRAMUSCULAR

## 2017-05-26 MED ORDER — FLUTICASONE PROPIONATE 50 MCG/ACT NA SUSP: NASAL | 11 refills | Status: DC

## 2017-05-26 NOTE — Telephone Encounter (Signed)
[facility] pharmacy requesting med refill. Rx written by historical provider.

## 2017-05-26 NOTE — Patient Instructions (Signed)
Plan: I think what we have here is costochondritis which is inflammation of the joint between the cartilage and bone of the rib. There may be some other muscle inflammation between the ribs. If this is not improving with anti-inflammatory treatment, please see your pulmonologist or come see [place] in the office.   Will not repeat chest Xray at this time but we should do so if you get worse  Will try anti-inflammatory (Naproxen) twice daily for one week, start it this evening  Will try muscle relaxer (Cyclobenzaprine) as needed, this may also help sleep   Continue follow-up with pulmonology to better address the chronic cough and control your symptoms   Costochondritis Costochondritis is swelling and irritation (inflammation) of the tissue (cartilage) that connects your ribs to your breastbone (sternum). This causes pain in the front of your chest. The pain usually starts gradually and involves more than one rib. What are the causes? The exact cause of this condition is not always known. It results from stress on the cartilage where your ribs attach to your sternum. The cause of this stress could be:  Chest injury (trauma).  Exercise or activity, such as lifting.  Severe coughing.  What increases the risk? You may be at higher risk for this condition if you:  Are male.  Are 60?60 years old.  Recently started a new exercise or work activity.  Have low levels of vitamin D.  Have a condition that makes you cough frequently.  What are the signs or symptoms? The main symptom of this condition is chest pain. The pain:  Usually starts gradually and can be sharp or dull.  Gets worse with deep breathing, coughing, or exercise.  Gets better with rest.  May be worse when you press on the sternum-rib connection (tenderness).  How is this diagnosed? This condition is diagnosed based on your symptoms, medical history, and a physical exam. Your health care provider will check for  tenderness when pressing on your sternum. This is the most important finding. You may also have tests to rule out other causes of chest pain. These may include:  A chest X-ray to check for lung problems.  An electrocardiogram (ECG) to see if you have a heart problem that could be causing the pain.  An imaging scan to rule out a chest or rib fracture.  How is this treated? This condition usually goes away on its own over time. Your health care provider may prescribe an NSAID to reduce pain and inflammation. Your health care provider may also suggest that you:  Rest and avoid activities that make pain worse.  Apply heat or cold to the area to reduce pain and inflammation.  Do exercises to stretch your chest muscles.  If these treatments do not help, your health care provider may inject a numbing medicine at the sternum-rib connection to help relieve the pain. Follow these instructions at home:  Avoid activities that make pain worse. This includes any activities that use chest, abdominal, and side muscles.  If directed, put ice on the painful area: ? Put ice in a plastic bag. ? Place a towel between your skin and the bag. ? Leave the ice on for 20 minutes, 2-3 times a day.  If directed, apply heat to the affected area as often as told by your health care provider. Use the heat source that your health care provider recommends, such as a moist heat pack or a heating pad. ? Place a towel between your skin and  the heat source. ? Leave the heat on for 20-30 minutes. ? Remove the heat if your skin turns bright red. This is especially important if you are unable to feel pain, heat, or cold. You may have a greater risk of getting burned.  Take over-the-counter and prescription medicines only as told by your health care provider.  Return to your normal activities as told by your health care provider. Ask your health care provider what activities are safe for you.  Keep all follow-up visits as  told by your health care provider. This is important. Contact a health care provider if:  You have chills or a fever.  Your pain does not go away or it gets worse.  You have a cough that does not go away (is persistent). Get help right away if:  You have shortness of breath. This information is not intended to replace advice given to you by your health care provider. Make sure you discuss any questions you have with your health care provider. Document Released: 11/26/2004 Document Revised: 09/06/2015 Document Reviewed: 06/12/2015 Elsevier Interactive Patient Education  [employer].

## 2017-05-26 NOTE — Progress Notes (Signed)
HPI: Justin Frank is a 62 y.o. male who  has a past medical history of Asthma, Controlled type 2 diabetes mellitus without complication, without long-term current use of insulin ([place]) (03/22/2017), ED (erectile dysfunction), Hyperlipidemia, and Hypertension.  he presents to [facility] today, 05/26/17,  for chief complaint of:  Left rib pain  Worse w/ coughing. Went to ER for this recently, CXR normal there per patient. Hurts at lower left anterior ribs, feels worse with coughing or sneezing. No worse than when he went to ER for this.  Following with pulmonology for bronchiectasis and chronic cough. Notes reviewed from last viist 7 days ago. Doing well, albuterol prn, mucinex bid prn cough, consider allergy referral, f/u 4 mos. ER notes he was feeling better, cough pain seems to be back. He'd rather not get another CXR today.     Past medical history, surgical history, social history and family history reviewed. No updates needed.   Current medication list and allergy/intolerance information reviewed.    Current Outpatient Medications on File Prior to Visit  Medication Sig Dispense Refill  . albuterol (PROVENTIL HFA;VENTOLIN HFA) 108 (90 Base) MCG/ACT inhaler Inhale 2 puffs into the lungs every 6 (six) hours as needed for wheezing. 2 Inhaler 11  . amLODipine (NORVASC) 10 MG tablet TK 1 T PO QD  4  . atorvastatin (LIPITOR) 10 MG tablet     . BREO ELLIPTA 100-25 MCG/INH AEPB INHALE 1 PUFF INTO THE LUNGS DAILY 28 each 0  . cetirizine (ZYRTEC) 10 MG tablet Take 1 tablet (10 mg total) by mouth daily. 90 tablet 3  . chlorpheniramine-HYDROcodone (TUSSIONEX PENNKINETIC ER) 10-8 MG/5ML SUER Take 5 mLs by mouth every 12 (twelve) hours as needed for cough. 70 mL 0  . cyclobenzaprine (FLEXERIL) 10 MG tablet Take 1 tablet (10 mg total) 2 (two) times daily as needed by mouth for muscle spasms. 20 tablet 0  . hydrOXYzine (ATARAX/VISTARIL) 25 MG tablet Take 1 tablet  (25 mg total) by mouth every 6 (six) hours. 12 tablet 0  . metFORMIN (GLUCOPHAGE XR) 500 MG 24 hr tablet Take 1 tablet (500 mg total) by mouth daily with breakfast. 90 tablet 1  . omeprazole (PRILOSEC) 40 MG capsule Take 1 capsule (40 mg total) by mouth daily. 90 capsule 0  . azithromycin (ZITHROMAX) 250 MG tablet 2 tabs po x1 on Day 1, then 1 tab po daily on Days 2 - 5. Fill if cough worse despite inhaler treatment (Patient not taking: Reported on 05/26/2017) 6 tablet 0  . predniSONE (DELTASONE) 20 MG tablet Take 1 tablet (20 mg total) by mouth 2 (two) times daily with a meal. (Patient not taking: Reported on 05/26/2017) 10 tablet 0   No current facility-administered medications on file prior to visit.    Allergies  Allergen Reactions  . Atorvastatin Swelling    " lip swelling " " lip swelling "   . Lisinopril Cough    Other reaction(s): Cough (ALLERGY/intolerance)      Review of Systems:  Constitutional: No recent illness  HEENT: No  headache, no vision change  Cardiac: No  chest pain, No  pressure, No palpitations  Respiratory:  No  shortness of breath. +chronic Cough  Gastrointestinal: No  abdominal pain, no change on bowel habits  Musculoskeletal: No new myalgia/arthralgia  Skin: No  Rash  Hem/Onc: No  easy bruising/bleeding, No  abnormal lumps/bumps  Neurologic: No  weakness, No  Dizziness  Psychiatric: No  concerns with depression,  No  concerns with anxiety  Exam:  BP (!) 165/89 (BP Location: Left Arm, Patient Position: Sitting, Cuff Size: Normal)   Pulse (!) 102   Temp 98.1 F (36.7 C) (Oral)   Wt 206 lb (93.4 kg)   BMI 35.36 kg/m   Constitutional: VS see above. General Appearance: alert, well-developed, well-nourished, NAD  Eyes: Normal lids and conjunctive, non-icteric sclera  Ears, Nose, Mouth, Throat: MMM, Normal external inspection ears/nares/mouth/lips/gums.  Neck: No masses, trachea midline.   Respiratory: Normal respiratory effort. no wheeze, no  rhonchi, no rales  Cardiovascular: S1/S2 normal, no murmur, no rub/gallop auscultated. RRR.   Musculoskeletal: Gait normal. Symmetric and independent movement of all extremities. Normal rib excursion on inhalation. Tenderness on L anterior lower ribs costochondral angles and maybe a bit laterally to this as well. No bruising. No crepitus.  Neurological: Normal balance/coordination. No tremor.  Skin: warm, dry, intact. No ecchymoses in area of pain.   Psychiatric: Normal judgment/insight. Normal mood and affect. Oriented x3.    Reviewed ER CXR 05/05/17 showed no CP disease    ASSESSMENT/PLAN: The primary encounter diagnosis was Costochondritis. Diagnoses of Rib pain on left side and Chronic cough were also pertinent to this visit.   Meds ordered this encounter  Medications  . ketorolac (TORADOL) injection 60 mg  . DISCONTD: cyclobenzaprine (FLEXERIL) 10 MG tablet    Sig: Take 0.5-1 tablets (5-10 mg total) by mouth 3 (three) times daily as needed for muscle spasms. caution may cause sedation    Dispense:  30 tablet    Refill:  0 Printed - corrected this an e-rx sent   . naproxen (NAPROSYN) 500 MG tablet    Sig: Take 1 tablet (500 mg total) by mouth 2 (two) times daily with a meal. Take daily for one week then as needed after that for aches/pains    Dispense:  30 tablet    Refill:  0  . cyclobenzaprine (FLEXERIL) 10 MG tablet    Sig: Take 0.5-1 tablets (5-10 mg total) by mouth 3 (three) times daily as needed for muscle spasms. caution may cause sedation    Dispense:  30 tablet    Refill:  0    Patient Instructions  Plan: I think what we have here is costochondritis which is inflammation of the joint between the cartilage and bone of the rib. There may be some other muscle inflammation between the ribs. If this is not improving with anti-inflammatory treatment, please see your pulmonologist or come see [place] in the office.   Will not repeat chest Xray at this time but we should do so  if you get worse  Will try anti-inflammatory (Naproxen) twice daily for one week, start it this evening  Will try muscle relaxer (Cyclobenzaprine) as needed, this may also help sleep   Continue follow-up with pulmonology to better address the chronic cough and control your symptoms   Costochondritis Costochondritis is swelling and irritation (inflammation) of the tissue (cartilage) that connects your ribs to your breastbone (sternum). This causes pain in the front of your chest. The pain usually starts gradually and involves more than one rib. What are the causes? The exact cause of this condition is not always known. It results from stress on the cartilage where your ribs attach to your sternum. The cause of this stress could be:  Chest injury (trauma).  Exercise or activity, such as lifting.  Severe coughing.  What increases the risk? You may be at higher risk for this condition if you:  Are male.  Are 10?61 years old.  Recently started a new exercise or work activity.  Have low levels of vitamin D.  Have a condition that makes you cough frequently.  What are the signs or symptoms? The main symptom of this condition is chest pain. The pain:  Usually starts gradually and can be sharp or dull.  Gets worse with deep breathing, coughing, or exercise.  Gets better with rest.  May be worse when you press on the sternum-rib connection (tenderness).  How is this diagnosed? This condition is diagnosed based on your symptoms, medical history, and a physical exam. Your health care provider will check for tenderness when pressing on your sternum. This is the most important finding. You may also have tests to rule out other causes of chest pain. These may include:  A chest X-ray to check for lung problems.  An electrocardiogram (ECG) to see if you have a heart problem that could be causing the pain.  An imaging scan to rule out a chest or rib fracture.  How is this  treated? This condition usually goes away on its own over time. Your health care provider may prescribe an NSAID to reduce pain and inflammation. Your health care provider may also suggest that you:  Rest and avoid activities that make pain worse.  Apply heat or cold to the area to reduce pain and inflammation.  Do exercises to stretch your chest muscles.  If these treatments do not help, your health care provider may inject a numbing medicine at the sternum-rib connection to help relieve the pain. Follow these instructions at home:  Avoid activities that make pain worse. This includes any activities that use chest, abdominal, and side muscles.  If directed, put ice on the painful area: ? Put ice in a plastic bag. ? Place a towel between your skin and the bag. ? Leave the ice on for 20 minutes, 2-3 times a day.  If directed, apply heat to the affected area as often as told by your health care provider. Use the heat source that your health care provider recommends, such as a moist heat pack or a heating pad. ? Place a towel between your skin and the heat source. ? Leave the heat on for 20-30 minutes. ? Remove the heat if your skin turns bright red. This is especially important if you are unable to feel pain, heat, or cold. You may have a greater risk of getting burned.  Take over-the-counter and prescription medicines only as told by your health care provider.  Return to your normal activities as told by your health care provider. Ask your health care provider what activities are safe for you.  Keep all follow-up visits as told by your health care provider. This is important. Contact a health care provider if:  You have chills or a fever.  Your pain does not go away or it gets worse.  You have a cough that does not go away (is persistent). Get help right away if:  You have shortness of breath. This information is not intended to replace advice given to you by your health care  provider. Make sure you discuss any questions you have with your health care provider. Document Released: 11/26/2004 Document Revised: 09/06/2015 Document Reviewed: 06/12/2015 Elsevier Interactive Patient Education  [employer].     Follow-up plan: Return if symptoms worsen or fail to improve.  Visit summary with medication list and pertinent instructions was printed for patient to review, alert  [place] if any changes needed. All questions at time of visit were answered - patient instructed to contact office with any additional concerns. ER/RTC precautions were reviewed with the patient and understanding verbalized.      Please note: voice recognition software was used to produce this document, and typos may escape review. Please contact Dr. Sheppard Coil for any needed clarifications.

## 2017-05-26 NOTE — Telephone Encounter (Signed)
Sent!

## 2017-05-26 NOTE — Telephone Encounter (Signed)
Noted. Pt has been updated during visit w/pcp today.

## 2017-06-14 ENCOUNTER — Encounter: Payer: Self-pay | Admitting: Osteopathic Medicine

## 2017-06-14 ENCOUNTER — Ambulatory Visit (INDEPENDENT_AMBULATORY_CARE_PROVIDER_SITE_OTHER): Payer: Managed Care, Other (non HMO) | Admitting: Osteopathic Medicine

## 2017-06-14 VITALS — BP 159/88 | HR 90 | Temp 98.0°F | Wt 208.0 lb

## 2017-06-14 DIAGNOSIS — I10 Essential (primary) hypertension: Secondary | ICD-10-CM | POA: Diagnosis not present

## 2017-06-14 DIAGNOSIS — E785 Hyperlipidemia, unspecified: Secondary | ICD-10-CM

## 2017-06-14 DIAGNOSIS — E1169 Type 2 diabetes mellitus with other specified complication: Secondary | ICD-10-CM | POA: Diagnosis not present

## 2017-06-14 DIAGNOSIS — Z6835 Body mass index (BMI) 35.0-35.9, adult: Secondary | ICD-10-CM | POA: Insufficient documentation

## 2017-06-14 DIAGNOSIS — Z23 Encounter for immunization: Secondary | ICD-10-CM | POA: Diagnosis not present

## 2017-06-14 DIAGNOSIS — E119 Type 2 diabetes mellitus without complications: Secondary | ICD-10-CM

## 2017-06-14 LAB — POCT UA - MICROALBUMIN
Albumin/Creatinine Ratio, Urine, POC: ABNORMAL
Creatinine, POC: 300 mg/dL
Microalbumin Ur, POC: 150 mg/L

## 2017-06-14 LAB — POCT GLYCOSYLATED HEMOGLOBIN (HGB A1C): Hemoglobin A1C: 6.7

## 2017-06-14 MED ORDER — LIRAGLUTIDE -WEIGHT MANAGEMENT 18 MG/3ML ~~LOC~~ SOPN: 3.0000 mg | PEN_INJECTOR | Freq: Every day | SUBCUTANEOUS | 0 refills | Status: DC

## 2017-06-14 MED ORDER — ATORVASTATIN CALCIUM 10 MG PO TABS: 10.0000 mg | ORAL_TABLET | Freq: Every day | ORAL | 3 refills | Status: DC

## 2017-06-14 MED ORDER — FLUTICASONE FUROATE-VILANTEROL 100-25 MCG/INH IN AEPB: INHALATION_SPRAY | RESPIRATORY_TRACT | 11 refills | Status: DC

## 2017-06-14 MED ORDER — VALSARTAN 40 MG PO TABS: 40.0000 mg | ORAL_TABLET | Freq: Every day | ORAL | 1 refills | Status: DC

## 2017-06-14 MED ORDER — METFORMIN HCL ER 500 MG PO TB24: 500.0000 mg | ORAL_TABLET | Freq: Every day | ORAL | 3 refills | Status: DC

## 2017-06-14 MED ORDER — AMLODIPINE BESYLATE 10 MG PO TABS: 10.0000 mg | ORAL_TABLET | Freq: Every day | ORAL | 3 refills | Status: DC

## 2017-06-14 MED ORDER — OMEPRAZOLE 40 MG PO CPDR: 40.0000 mg | DELAYED_RELEASE_CAPSULE | Freq: Every day | ORAL | 3 refills | Status: DC

## 2017-06-14 NOTE — Patient Instructions (Signed)
Plan:  Start Saxenda for weight which should help sugars too  Start Valsartan for blood pressure and to prevent kidney damage from increased sugar levels   Continue all other medications as-is   Recheck sugars in 3 months  Recheck BP sooner - see follow-up instructions!

## 2017-06-14 NOTE — Progress Notes (Signed)
HPI: Justin Frank is a 62 y.o. male who  has a past medical history of Asthma, Controlled type 2 diabetes mellitus without complication, without long-term current use of insulin ([place]) (03/22/2017), ED (erectile dysfunction), Hyperlipidemia, and Hypertension.  he presents to [facility] today, 06/14/17,  for chief complaint of:  Recheck A1C   DIABETES SCREENING/PREVENTIVE CARE: A1C past 3-6 mos: Yes  controlled? Yes   03/15/17: 6.9%, we started Metformin XR 500 mg daily  06/14/17: 6.7%,  BP goal <130/80: No  LDL goal <70: 71 Eye exam annually: No record on file, importance discussed with patient Foot exam: none today, will do this next visit  Microalbuminuria:Yes  Metformin: Yes  ACE/ARB: No  Antiplatelet if ASCVD Risk >10%: Yes  Statin: Yes  Pneumovax: given today   Pt inquires about weight control medications as well today     Results for orders placed or performed in visit on 06/14/17 (from the past 24 hour(s))  POCT UA - Microalbumin     Status: None   Collection Time: 06/14/17  3:08 PM  Result Value Ref Range   Microalbumin Ur, POC 150 mg/L   Creatinine, POC 300 mg/dL   Albumin/Creatinine Ratio, Urine, POC abnormal       Patient is accompanied by wife who assists with history-taking.   Past medical history, surgical history, social history and family history reviewed. No updates needed.   Current medication list and allergy/intolerance information reviewed.    Current Outpatient Medications on File Prior to Visit  Medication Sig Dispense Refill  . albuterol (PROVENTIL HFA;VENTOLIN HFA) 108 (90 Base) MCG/ACT inhaler Inhale 2 puffs into the lungs every 6 (six) hours as needed for wheezing. 2 Inhaler 11  . amLODipine (NORVASC) 10 MG tablet TK 1 T PO QD  4  . atorvastatin (LIPITOR) 10 MG tablet     . BREO ELLIPTA 100-25 MCG/INH AEPB INHALE 1 PUFF INTO THE LUNGS DAILY 28 each 0  . cetirizine (ZYRTEC) 10 MG tablet Take 1 tablet  (10 mg total) by mouth daily. 90 tablet 3  . chlorpheniramine-HYDROcodone (TUSSIONEX PENNKINETIC ER) 10-8 MG/5ML SUER Take 5 mLs by mouth every 12 (twelve) hours as needed for cough. 70 mL 0  . cyclobenzaprine (FLEXERIL) 10 MG tablet Take 0.5-1 tablets (5-10 mg total) by mouth 3 (three) times daily as needed for muscle spasms. caution may cause sedation 30 tablet 0  . fluticasone (FLONASE) 50 MCG/ACT nasal spray SHAKE LIQUID AND USE 1 SPRAY IN EACH NOSTRIL EVERY DAY 16 g 11  . hydrOXYzine (ATARAX/VISTARIL) 25 MG tablet Take 1 tablet (25 mg total) by mouth every 6 (six) hours. 12 tablet 0  . metFORMIN (GLUCOPHAGE XR) 500 MG 24 hr tablet Take 1 tablet (500 mg total) by mouth daily with breakfast. 90 tablet 1  . naproxen (NAPROSYN) 500 MG tablet Take 1 tablet (500 mg total) by mouth 2 (two) times daily with a meal. Take daily for one week then as needed after that for aches/pains 30 tablet 0  . omeprazole (PRILOSEC) 40 MG capsule Take 1 capsule (40 mg total) by mouth daily. 90 capsule 0  . predniSONE (DELTASONE) 20 MG tablet Take 1 tablet (20 mg total) by mouth 2 (two) times daily with a meal. (Patient not taking: Reported on 05/26/2017) 10 tablet 0   No current facility-administered medications on file prior to visit.    Allergies  Allergen Reactions  . Atorvastatin Swelling    " lip swelling " " lip swelling "   .  Lisinopril Cough    Other reaction(s): Cough (ALLERGY/intolerance)      Review of Systems:  Constitutional: No recent illness  HEENT: No  headache, no vision change  Cardiac: No  chest pain, No  pressure, No palpitations  Respiratory:  No  shortness of breath. No  Cough  Gastrointestinal: No  abdominal pain, no change on bowel habits  Musculoskeletal: No new myalgia/arthralgia  Skin: No  Rash  Hem/Onc: No  easy bruising/bleeding, No  abnormal lumps/bumps  Neurologic: No  weakness, No  Dizziness  Psychiatric: No  concerns with depression, No  concerns with  anxiety  Exam:  BP (!) 159/88 (BP Location: Left Arm, Patient Position: Sitting, Cuff Size: Large)   Pulse 90   Temp 98 F (36.7 C) (Oral)   Wt 208 lb (94.3 kg)   BMI 35.70 kg/m   Constitutional: VS see above. General Appearance: alert, well-developed, well-nourished, NAD  Eyes: Normal lids and conjunctive, non-icteric sclera  Ears, Nose, Mouth, Throat: MMM, Normal external inspection ears/nares/mouth/lips/gums.  Neck: No masses, trachea midline.   Respiratory: Normal respiratory effort. no wheeze, no rhonchi, no rales  Cardiovascular: S1/S2 normal, no murmur, no rub/gallop auscultated. RRR.   Musculoskeletal: Gait normal. Symmetric and independent movement of all extremities  Neurological: Normal balance/coordination. No tremor.  Skin: warm, dry, intact.   Psychiatric: Normal judgment/insight. Normal mood and affect. Oriented x3.     ASSESSMENT/PLAN: Adding Saxenda for better A1C and weight control. Added ARB for better BP control and prevention renal complications of diabetes   Controlled type 2 diabetes mellitus without complication, without long-term current use of insulin ([facility]) - Plan: POCT HgB A1C, POCT UA - Microalbumin, metFORMIN (GLUCOPHAGE XR) 500 MG 24 hr tablet, Liraglutide -Weight Management (SAXENDA) 18 MG/3ML SOPN, valsartan (DIOVAN) 40 MG tablet  Need for Tdap vaccination - Plan: Tdap vaccine greater than or equal to 7yo IM  Need for pneumococcal vaccination - Plan: Pneumococcal polysaccharide vaccine 23-valent greater than or equal to 2yo subcutaneous/IM  Essential hypertension - Plan: amLODipine (NORVASC) 10 MG tablet, Liraglutide -Weight Management (SAXENDA) 18 MG/3ML SOPN, valsartan (DIOVAN) 40 MG tablet  Hyperlipidemia associated with type 2 diabetes mellitus ([place]) - Plan: atorvastatin (LIPITOR) 10 MG tablet  BMI 35.0-35.9,adult - Plan: Liraglutide -Weight Management (SAXENDA) 18 MG/3ML SOPN   Meds ordered this encounter  Medications  . omeprazole  (PRILOSEC) 40 MG capsule    Sig: Take 1 capsule (40 mg total) by mouth daily.    Dispense:  90 capsule    Refill:  3  . fluticasone furoate-vilanterol (BREO ELLIPTA) 100-25 MCG/INH AEPB    Sig: INHALE 1 PUFF INTO THE LUNGS DAILY    Dispense:  28 each    Refill:  11  . DISCONTD: Liraglutide -Weight Management (SAXENDA) 18 MG/3ML SOPN    Sig: Inject 3 mg into the skin daily. 0.6 mg injected subcutaneously daily for 1 week, then increase by 0.6 mg weekly until reaching 3 mg injected subcutaneous daily    Dispense:  3 pen    Refill:  0    Patient will have discount coupon, please include novofine needles QS  . amLODipine (NORVASC) 10 MG tablet    Sig: Take 1 tablet (10 mg total) by mouth daily.    Dispense:  90 tablet    Refill:  3  . atorvastatin (LIPITOR) 10 MG tablet    Sig: Take 1 tablet (10 mg total) by mouth daily.    Dispense:  90 tablet    Refill:  3  .  metFORMIN (GLUCOPHAGE XR) 500 MG 24 hr tablet    Sig: Take 1 tablet (500 mg total) by mouth daily with breakfast.    Dispense:  90 tablet    Refill:  3  . Liraglutide -Weight Management (SAXENDA) 18 MG/3ML SOPN    Sig: Inject 3 mg into the skin daily. 0.6 mg injected subcutaneously daily for 1 week, then increase by 0.6 mg weekly until reaching 3 mg injected subcutaneous daily    Dispense:  3 pen    Refill:  0    Patient will have discount coupon, please include novofine needles QS  . valsartan (DIOVAN) 40 MG tablet    Sig: Take 1 tablet (40 mg total) by mouth daily.    Dispense:  30 tablet    Refill:  1    Patient Instructions  Plan:  Start Saxenda for weight which should help sugars too  Start Valsartan for blood pressure and to prevent kidney damage from increased sugar levels   Continue all other medications as-is   Recheck sugars in 3 months  Recheck BP sooner - see follow-up instructions!     Follow-up plan: Return in about 2 weeks (around 06/28/2017) for recheck BP - nurse visit. If BP still high will  increase Valsartan dose .   Visit summary with medication list and pertinent instructions was printed for patient to review, alert [place] if any changes needed. All questions at time of visit were answered - patient instructed to contact office with any additional concerns. ER/RTC precautions were reviewed with the patient and understanding verbalized.   Note: Total time spent 25 minutes, greater than 50% of the visit was spent face-to-face counseling and coordinating care for the following: The primary encounter diagnosis was Controlled type 2 diabetes mellitus without complication, without long-term current use of insulin ([place]). Diagnoses of Need for Tdap vaccination, Need for pneumococcal vaccination, Essential hypertension, Hyperlipidemia associated with type 2 diabetes mellitus ([place]), and BMI 35.0-35.9,adult were also pertinent to this visit.Marland Kitchen  Please note: voice recognition software was used to produce this document, and typos may escape review. Please contact Dr. Sheppard Coil for any needed clarifications.

## 2017-06-15 ENCOUNTER — Encounter: Payer: Self-pay | Admitting: Osteopathic Medicine

## 2017-06-16 ENCOUNTER — Telehealth: Payer: Self-pay | Admitting: Osteopathic Medicine

## 2017-06-16 NOTE — Telephone Encounter (Signed)
Received fax from [facility] that [place] was approved from 05/17/2017 through 10/14/2017 Pharmacy notified and forms sent to scan.   Reference ID: [id number].

## 2017-06-28 ENCOUNTER — Ambulatory Visit (INDEPENDENT_AMBULATORY_CARE_PROVIDER_SITE_OTHER): Payer: Managed Care, Other (non HMO) | Admitting: Osteopathic Medicine

## 2017-06-28 VITALS — BP 140/76 | HR 80

## 2017-06-28 DIAGNOSIS — I10 Essential (primary) hypertension: Secondary | ICD-10-CM | POA: Diagnosis not present

## 2017-06-28 DIAGNOSIS — E119 Type 2 diabetes mellitus without complications: Secondary | ICD-10-CM | POA: Diagnosis not present

## 2017-06-28 MED ORDER — VALSARTAN 80 MG PO TABS: 80.0000 mg | ORAL_TABLET | Freq: Every day | ORAL | 1 refills | Status: DC

## 2017-06-28 NOTE — Progress Notes (Signed)
Pt came into clinic today for BP check. At last OV, his Diovan was increased to 40mg  daily. Pt reports taking this new dose with no negative side effects. Denies any SOB, chest pain, headaches, blurred vision, or dizziness. Pt's BP was better than last check, but still not at goal. Let him sit prior to recheck. Pt's BP was still above goal. Per PCP, Diovan increased to 80mg  daily. New rx sent. Pt advise to return in 2 weeks for repeat NV BP check. Verbalized understanding.

## 2017-06-28 NOTE — Progress Notes (Signed)
Systolic and diastolic better, systolic not quite at 185 goal. Will try Valsartan to 80 mg and recheck in another 2 weeks

## 2017-07-12 ENCOUNTER — Encounter: Payer: Self-pay | Admitting: Family Medicine

## 2017-07-12 ENCOUNTER — Ambulatory Visit: Payer: Managed Care, Other (non HMO)

## 2017-07-12 ENCOUNTER — Ambulatory Visit (INDEPENDENT_AMBULATORY_CARE_PROVIDER_SITE_OTHER): Payer: Managed Care, Other (non HMO) | Admitting: Family Medicine

## 2017-07-12 VITALS — BP 141/85 | HR 93 | Ht 64.0 in | Wt 202.0 lb

## 2017-07-12 DIAGNOSIS — G5603 Carpal tunnel syndrome, bilateral upper limbs: Secondary | ICD-10-CM | POA: Diagnosis not present

## 2017-07-12 DIAGNOSIS — M7541 Impingement syndrome of right shoulder: Secondary | ICD-10-CM

## 2017-07-12 NOTE — Patient Instructions (Addendum)
Thank you for coming in today. You should hear about the nerve study soon.  If you do not hear anything by Friday let me know.   Call or go to the ER if you develop a large red swollen joint with extreme pain or oozing puss.   work on home exercises for shoulder.   Use the wrist splint at night for carpal tunnel.   Shoulder: Up to  the front Up to the side Internal rotation External rotation.  About 30 reps both sides 2x daily.   Recheck with me in August .

## 2017-07-12 NOTE — Progress Notes (Signed)
Justin Frank is a 62 y.o. male who presents to [facility]: [place] today for shoulder pain.   Justin Frank reports that he began experiencing right shoulder discomfort and arm numbness two months ago. 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 It is also worse when sleeping on his side at night. He has not taken anything for this pain. He states he had left rotator cuff surgery many years ago after having similar issues. Patient denies traumatic event to his shoulder. He denies chest pain and shortness of breath. No injury history.  Right hand numbness :Patient notes numbness in his right hand. He notes numbness all over but notes that it is worse win the 1st 3 digits. He notes that then numbness is present at night and following activity. It has been present for several months without injury. He has not tired any treatment yet.    Past Medical History:  Diagnosis Date  . Asthma   . Controlled type 2 diabetes mellitus without complication, without long-term current use of insulin ([place]) 03/22/2017  . ED (erectile dysfunction)   . Hyperlipidemia   . Hypertension    Past Surgical History:  Procedure Laterality Date  . HERNIA REPAIR     As per patient - 2012, 2013, 2015   Social History   Tobacco Use  . Smoking status: Former Smoker    Last attempt to quit: 03/02/1977    Years since quitting: 40.3  . Smokeless tobacco: Never Used  Substance Use Topics  . Alcohol use: No   family history includes Hypertension in his father and mother.  ROS as above:  Medications: Current Outpatient Medications  Medication Sig Dispense Refill  . albuterol (PROVENTIL HFA;VENTOLIN HFA) 108 (90 Base) MCG/ACT inhaler Inhale 2 puffs into the lungs every 6 (six) hours as needed for wheezing. 2 Inhaler 11  . amLODipine  (NORVASC) 10 MG tablet Take 1 tablet (10 mg total) by mouth daily. 90 tablet 3  . atorvastatin (LIPITOR) 10 MG tablet Take 1 tablet (10 mg total) by mouth daily. 90 tablet 3  . cetirizine (ZYRTEC) 10 MG tablet Take 1 tablet (10 mg total) by mouth daily. 90 tablet 3  . cyclobenzaprine (FLEXERIL) 10 MG tablet Take 0.5-1 tablets (5-10 mg total) by mouth 3 (three) times daily as needed for muscle spasms. caution may cause sedation 30 tablet 0  . fluticasone (FLONASE) 50 MCG/ACT nasal spray SHAKE LIQUID AND USE 1 SPRAY IN EACH NOSTRIL EVERY DAY 16 g 11  . fluticasone furoate-vilanterol (BREO ELLIPTA) 100-25 MCG/INH AEPB INHALE 1 PUFF INTO THE LUNGS DAILY 28 each 11  . hydrOXYzine (ATARAX/VISTARIL) 25 MG tablet Take 1 tablet (25 mg total) by mouth every 6 (six) hours. 12 tablet 0  . Liraglutide -Weight Management (SAXENDA) 18 MG/3ML SOPN Inject 3 mg into the skin daily. 0.6 mg injected subcutaneously daily for 1 week, then increase by 0.6 mg weekly until reaching 3 mg injected subcutaneous daily 3 pen 0  . metFORMIN (GLUCOPHAGE XR) 500 MG 24 hr tablet Take 1 tablet (500 mg total) by mouth daily with breakfast. 90 tablet 3  . naproxen (NAPROSYN) 500 MG tablet Take 1 tablet (500 mg total) by mouth 2 (two) times daily with a meal. Take daily for one week then as needed after that for aches/pains 30 tablet 0  . omeprazole (PRILOSEC) 40 MG capsule Take 1 capsule (  40 mg total) by mouth daily. 90 capsule 3  . valsartan (DIOVAN) 80 MG tablet Take 1 tablet (80 mg total) by mouth daily. 30 tablet 1   No current facility-administered medications for this visit.    Allergies  Allergen Reactions  . Atorvastatin Swelling    " lip swelling " " lip swelling "   . Lisinopril Cough    Other reaction(s): Cough (ALLERGY/intolerance)    Health Maintenance Health Maintenance  Topic Date Due  . Hepatitis C Screening  April 20, 1955  . FOOT EXAM  12/31/1967  . OPHTHALMOLOGY EXAM  12/31/1967  . HIV Screening  12/30/1972   . COLONOSCOPY  12/31/2007  . INFLUENZA VACCINE  09/30/2017  . HEMOGLOBIN A1C  12/14/2017  . PNEUMOCOCCAL POLYSACCHARIDE VACCINE (2) 06/15/2022  . TETANUS/TDAP  06/15/2027     Exam:  BP (!) 141/85   Pulse 93   Ht 5\' 4"  (1.626 m)   Wt 202 lb (91.6 kg)   BMI 34.67 kg/m  Gen: Well NAD HEENT: EOMI,  MMM Lungs: Normal work of breathing. CTABL Heart: RRR no MRG Abd: NABS, Soft. Nondistended, Nontender Exts: Brisk capillary refill, warm and well perfused.   MSK  Cspine: Non-tender.  Normal ROM Negative Spurlings test.  Upper extremity strength is intact BL.   Right shoulder: Normal appearing. Non-tender AC joint and biceps groove.  KKX:FGHWEX external and internal.  Abduction limited to 120 degrees by pain.  Positive hawking and neers test.  Positive empty can test.  Strength is intact.    Right hand and wrist: Normal appearing.  Normal motion and strength.  Sensation, pulses and capillary refill is intact.  Positive tinnels and Phalen test.    Procedure: Real-time Ultrasound Guided Injection of right subacromial injection  Device: GE Logiq E   Images permanently stored and available for review in the ultrasound unit. Verbal informed consent obtained.  Discussed risks and benefits of procedure. Warned about infection bleeding damage to structures skin hypopigmentation and fat atrophy among others. Patient expresses understanding and agreement Time-out conducted.   Noted no overlying erythema, induration, or other signs of local infection.   Skin prepped in a sterile fashion.   Local anesthesia: Topical Ethyl chloride.   With sterile technique and under real time ultrasound guidance:  80mg  kenalog and 90ml marcaine injected easily.   Completed without difficulty   Pain immediately resolved suggesting accurate placement of the medication.   Advised to call if fevers/chills, erythema, induration, drainage, or persistent bleeding.   Images permanently stored and  available for review in the ultrasound unit.  Impression: Technically successful ultrasound guided injection.     Assessment and Plan: 62 y.o. male with shoulder pain.  Right Shoulder pain: Patient's described pain and occupation make rotator cuff tendonitis the most likely cause. Patient had resolution of pain following subacromial injection which supports this diagnosis.  Plan to treat with injection as above as well as home exercise plan.  Recheck in 3 months or so.     Right hand numbness. Carpal tunnel syndrome likely. Plan for night splint and EMG.     Orders Placed This Encounter  Procedures  . NCV with EMG(electromyography)    Standing Status:   Future    Standing Expiration Date:   07/13/2018    Scheduling Instructions:     In [place] area if able before June 1st    Order Specific Question:   Where should this test be performed?    Answer:   other   No orders  of the defined types were placed in this encounter.    Discussed warning signs or symptoms. Please see discharge instructions. Patient expresses understanding.

## 2017-07-19 ENCOUNTER — Other Ambulatory Visit: Payer: Self-pay | Admitting: Osteopathic Medicine

## 2017-07-19 DIAGNOSIS — E119 Type 2 diabetes mellitus without complications: Secondary | ICD-10-CM

## 2017-07-19 DIAGNOSIS — I10 Essential (primary) hypertension: Secondary | ICD-10-CM

## 2017-07-19 DIAGNOSIS — Z6835 Body mass index (BMI) 35.0-35.9, adult: Secondary | ICD-10-CM

## 2017-07-29 ENCOUNTER — Other Ambulatory Visit: Payer: Self-pay | Admitting: Osteopathic Medicine

## 2017-07-29 DIAGNOSIS — E119 Type 2 diabetes mellitus without complications: Secondary | ICD-10-CM

## 2017-07-29 DIAGNOSIS — E785 Hyperlipidemia, unspecified: Secondary | ICD-10-CM

## 2017-07-29 DIAGNOSIS — E1169 Type 2 diabetes mellitus with other specified complication: Secondary | ICD-10-CM

## 2017-07-29 DIAGNOSIS — Z6835 Body mass index (BMI) 35.0-35.9, adult: Secondary | ICD-10-CM

## 2017-07-29 DIAGNOSIS — I10 Essential (primary) hypertension: Secondary | ICD-10-CM

## 2017-07-29 MED ORDER — ATORVASTATIN CALCIUM 10 MG PO TABS: 10.0000 mg | ORAL_TABLET | Freq: Every day | ORAL | 3 refills | Status: DC

## 2017-07-29 MED ORDER — LIRAGLUTIDE -WEIGHT MANAGEMENT 18 MG/3ML ~~LOC~~ SOPN: 3.0000 mg | PEN_INJECTOR | Freq: Every day | SUBCUTANEOUS | 3 refills | Status: DC

## 2017-08-28 ENCOUNTER — Other Ambulatory Visit: Payer: Self-pay | Admitting: Osteopathic Medicine

## 2017-08-28 DIAGNOSIS — E119 Type 2 diabetes mellitus without complications: Secondary | ICD-10-CM

## 2017-08-28 DIAGNOSIS — I10 Essential (primary) hypertension: Secondary | ICD-10-CM

## 2017-09-13 ENCOUNTER — Ambulatory Visit: Payer: Managed Care, Other (non HMO) | Admitting: Osteopathic Medicine

## 2017-10-26 ENCOUNTER — Encounter (HOSPITAL_BASED_OUTPATIENT_CLINIC_OR_DEPARTMENT_OTHER): Payer: Self-pay | Admitting: *Deleted

## 2017-10-26 ENCOUNTER — Emergency Department (HOSPITAL_BASED_OUTPATIENT_CLINIC_OR_DEPARTMENT_OTHER): Payer: Self-pay

## 2017-10-26 ENCOUNTER — Emergency Department (HOSPITAL_BASED_OUTPATIENT_CLINIC_OR_DEPARTMENT_OTHER)
Admission: EM | Admit: 2017-10-26 | Discharge: 2017-10-26 | Disposition: A | Discharge status: Home / Self Care | Payer: Self-pay | Attending: Emergency Medicine | Admitting: Emergency Medicine

## 2017-10-26 ENCOUNTER — Other Ambulatory Visit: Payer: Self-pay

## 2017-10-26 DIAGNOSIS — I10 Essential (primary) hypertension: Secondary | ICD-10-CM | POA: Insufficient documentation

## 2017-10-26 DIAGNOSIS — J45909 Unspecified asthma, uncomplicated: Secondary | ICD-10-CM | POA: Insufficient documentation

## 2017-10-26 DIAGNOSIS — Z7984 Long term (current) use of oral hypoglycemic drugs: Secondary | ICD-10-CM | POA: Insufficient documentation

## 2017-10-26 DIAGNOSIS — J471 Bronchiectasis with (acute) exacerbation: Secondary | ICD-10-CM | POA: Insufficient documentation

## 2017-10-26 DIAGNOSIS — Z87891 Personal history of nicotine dependence: Secondary | ICD-10-CM | POA: Insufficient documentation

## 2017-10-26 DIAGNOSIS — R05 Cough: Secondary | ICD-10-CM

## 2017-10-26 DIAGNOSIS — E785 Hyperlipidemia, unspecified: Secondary | ICD-10-CM | POA: Insufficient documentation

## 2017-10-26 DIAGNOSIS — E119 Type 2 diabetes mellitus without complications: Secondary | ICD-10-CM | POA: Insufficient documentation

## 2017-10-26 DIAGNOSIS — R059 Cough, unspecified: Secondary | ICD-10-CM

## 2017-10-26 DIAGNOSIS — Z79899 Other long term (current) drug therapy: Secondary | ICD-10-CM | POA: Insufficient documentation

## 2017-10-26 MED ORDER — PREDNISONE 20 MG PO TABS: ORAL_TABLET | ORAL | 0 refills | Status: DC

## 2017-10-26 MED ORDER — AZITHROMYCIN 250 MG PO TABS
500.0000 mg | ORAL_TABLET | Freq: Once | ORAL | Status: AC
  Administered 2017-10-26: 500 mg via ORAL

## 2017-10-26 MED ORDER — AZITHROMYCIN 250 MG PO TABS: 250.0000 mg | ORAL_TABLET | Freq: Every day | ORAL | 0 refills | Status: DC

## 2017-10-26 MED ORDER — PREDNISONE 50 MG PO TABS
60.0000 mg | ORAL_TABLET | Freq: Once | ORAL | Status: AC
  Administered 2017-10-26: 60 mg via ORAL
  Filled 2017-10-26: qty 1

## 2017-10-26 MED ORDER — AZITHROMYCIN 250 MG PO TABS
1000.0000 mg | ORAL_TABLET | Freq: Once | ORAL | Status: DC
  Filled 2017-10-26: qty 4

## 2017-10-26 NOTE — ED Notes (Signed)
ED Provider at bedside. 

## 2017-10-26 NOTE — ED Provider Notes (Signed)
[facility] EMERGENCY DEPARTMENT Provider Note   CSN: [id number] Arrival date & time: 10/26/17  1455     History   Chief Complaint Chief Complaint  Patient presents with  . Cough    HPI Justin Frank is a 62 y.o. male.  Patient reports non-productive cough and one month with right sided chest discomfort. He occasionally is coughing so forcefully that he vomits. He has attempted OTC and herbal products without relief of the symptoms. No documented fevers, but occasionally has "chills".    Cough  This is a recurrent problem. The current episode started more than 1 week ago. The problem occurs hourly. The problem has been gradually worsening. The cough is non-productive. There has been no fever. Associated symptoms include chills. He has tried cough syrup for the symptoms. The treatment provided no relief. Smoker: former, quit 1979. His past medical history is significant for bronchiectasis.    Past Medical History:  Diagnosis Date  . Asthma   . Controlled type 2 diabetes mellitus without complication, without long-term current use of insulin ([place]) 03/22/2017  . ED (erectile dysfunction)   . Hyperlipidemia   . Hypertension     Patient Active Problem List   Diagnosis Date Noted  . BMI 35.0-35.9,adult 06/14/2017  . Controlled type 2 diabetes mellitus without complication, without long-term current use of insulin ([place]) 03/22/2017  . Fatigue 03/16/2017  . Elevated fasting glucose 03/16/2017  . Weight gain 03/16/2017  . Chronic cough 03/16/2017  . Elevated hemoglobin A1c 03/16/2017  . Vitamin D deficiency 03/16/2017  . Class 2 obesity in adult 02/15/2017  . Lymphadenopathy, mediastinal 02/15/2017  . RBC microcytosis 02/15/2017  . Thrombocytopenia ([place]) 02/15/2017  . Abnormal EKG 05/15/2014  . Atypical chest pain 05/15/2014  . Essential hypertension 05/15/2014  . Hyperlipidemia 05/15/2014  . Recurrent umbilical hernia 84/16/6063    Past Surgical History:    Procedure Laterality Date  . HERNIA REPAIR     As per patient - 2012, 2013, 2015        Home Medications    Prior to Admission medications   Medication Sig Start Date End Date Taking? Authorizing Provider  albuterol (PROVENTIL HFA;VENTOLIN HFA) 108 (90 Base) MCG/ACT inhaler Inhale 2 puffs into the lungs every 6 (six) hours as needed for wheezing. 04/21/17   Emeterio Reeve, DO  amLODipine (NORVASC) 10 MG tablet Take 1 tablet (10 mg total) by mouth daily. 06/14/17   Emeterio Reeve, DO  atorvastatin (LIPITOR) 10 MG tablet Take 1 tablet (10 mg total) by mouth daily. 07/29/17   Emeterio Reeve, DO  cetirizine (ZYRTEC) 10 MG tablet Take 1 tablet (10 mg total) by mouth daily. 05/26/17   Emeterio Reeve, DO  cyclobenzaprine (FLEXERIL) 10 MG tablet Take 0.5-1 tablets (5-10 mg total) by mouth 3 (three) times daily as needed for muscle spasms. caution may cause sedation 05/26/17   Emeterio Reeve, DO  fluticasone [facility]) 50 MCG/ACT nasal spray SHAKE LIQUID AND USE 1 SPRAY IN [facility] NOSTRIL EVERY DAY 05/26/17   Emeterio Reeve, DO  fluticasone furoate-vilanterol (BREO ELLIPTA) 100-25 MCG/INH AEPB INHALE 1 PUFF INTO THE LUNGS DAILY 06/14/17   Emeterio Reeve, DO  hydrOXYzine (ATARAX/VISTARIL) 25 MG tablet Take 1 tablet (25 mg total) by mouth every 6 (six) hours. 02/28/17   Dorie Rank, MD  Liraglutide -Weight Management (SAXENDA) 18 MG/3ML SOPN Inject 3 mg into the skin daily. 07/29/17   Emeterio Reeve, DO  metFORMIN (GLUCOPHAGE XR) 500 MG 24 hr tablet Take 1 tablet (500 mg total)  by mouth daily with breakfast. 06/14/17 06/14/18  Emeterio Reeve, DO  naproxen (NAPROSYN) 500 MG tablet Take 1 tablet (500 mg total) by mouth 2 (two) times daily with a meal. Take daily for one week then as needed after that for aches/pains 05/26/17   Emeterio Reeve, DO  omeprazole (PRILOSEC) 40 MG capsule Take 1 capsule (40 mg total) by mouth daily. 06/14/17   Emeterio Reeve, DO  valsartan (DIOVAN)  80 MG tablet TAKE 1 TABLET(80 MG) BY MOUTH DAILY 08/30/17   Emeterio Reeve, DO    Family History Family History  Problem Relation Age of Onset  . Hypertension Mother   . Hypertension Father     Social History Social History   Tobacco Use  . Smoking status: Former Smoker    Last attempt to quit: 03/02/1977    Years since quitting: 40.6  . Smokeless tobacco: Never Used  Substance Use Topics  . Alcohol use: No  . Drug use: No     Allergies   Atorvastatin and Lisinopril   Review of Systems Review of Systems  Constitutional: Positive for chills.  Respiratory: Positive for cough.   Gastrointestinal: Positive for vomiting.  All other systems reviewed and are negative.    Physical Exam Updated Vital Signs BP (!) 144/85   Pulse 97   Temp 98.3 F (36.8 C) (Oral)   Resp 18   Ht 5\' 4"  (2.778 m)   Wt 93.4 kg   SpO2 96%   BMI 35.36 kg/m   Physical Exam  Constitutional: He is oriented to person, place, and time. He appears well-developed and well-nourished.  HENT:  Head: Atraumatic.  Eyes: Conjunctivae are normal.  Neck: Neck supple.  Cardiovascular: Normal rate and regular rhythm.  Pulmonary/Chest: Effort normal.  Coarse breath sounds in all fields  Abdominal: Soft. Bowel sounds are normal.  Musculoskeletal: Normal range of motion. He exhibits no edema.  Neurological: He is alert and oriented to person, place, and time.  Skin: Skin is warm and dry.  Psychiatric: He has a normal mood and affect.  Nursing note and vitals reviewed.    ED Treatments / Results  Labs (all labs ordered are listed, but only abnormal results are displayed) Labs Reviewed - No data to display  EKG None  Radiology Dg Chest 2 View  Result Date: 10/26/2017 CLINICAL DATA:  Cough for 1 month, history of hypertension, former smoking history EXAM: CHEST - 2 VIEW COMPARISON:  Chest x-ray of 04/21/2017 FINDINGS: No active infiltrate or effusion is seen. There is mild peribronchial  thickening which may be chronic in this patient with a smoking history. Mediastinal and hilar contours are unremarkable. The heart is within normal limits in size. No bony abnormality is seen. IMPRESSION: No active cardiopulmonary disease. Electronically Signed   By: Ivar Drape M.D.   On: 10/26/2017 16:13    Procedures Procedures (including critical care time)  Medications Ordered in ED Medications  predniSONE (DELTASONE) tablet 60 mg (60 mg Oral Given 10/26/17 1701)  azithromycin (ZITHROMAX) tablet 500 mg (500 mg Oral Given 10/26/17 1701)     Initial Impression / Assessment and Plan / ED Course  I have reviewed the triage vital signs and the nursing notes.  Pertinent labs & imaging results that were available during my care of the patient were reviewed by me and considered in my medical decision making (see chart for details).    Patient discussed with Dr. Ralene Bathe.  Patient with history of bronchiectasis. Cough for 4 weeks. Is currently taking  mucinex, zyrtec, and albuterol MDI. He has also tried herbal remedies, all without improvement in symptoms. Has responded to steroids in the past. I believe it is reasonable to provide antibiotic for current exacerbation.  Final Clinical Impressions(s) / ED Diagnoses   Final diagnoses:  Cough  Bronchiectasis with acute exacerbation [facility])    ED Discharge Orders         Ordered    azithromycin (ZITHROMAX) 250 MG tablet  Daily     10/26/17 1704    predniSONE (DELTASONE) 20 MG tablet     10/26/17 1704           Etta Quill, NP 10/26/17 1706    Quintella Reichert, MD 10/26/17 1723

## 2017-10-26 NOTE — ED Triage Notes (Signed)
Cough for a month. States his sides hurt from the cough that gets worse at night.

## 2017-10-26 NOTE — Discharge Instructions (Addendum)
Continue with your home medications as discussed. Follow-up with your primary care provider and with your pulmonologist.

## 2017-11-02 ENCOUNTER — Ambulatory Visit: Payer: Managed Care, Other (non HMO) | Admitting: Family Medicine

## 2017-12-10 ENCOUNTER — Other Ambulatory Visit: Payer: Self-pay | Admitting: Osteopathic Medicine

## 2017-12-10 DIAGNOSIS — I10 Essential (primary) hypertension: Secondary | ICD-10-CM

## 2017-12-10 DIAGNOSIS — E119 Type 2 diabetes mellitus without complications: Secondary | ICD-10-CM

## 2017-12-10 NOTE — Telephone Encounter (Signed)
Received a refill request for valsartan 40 mg, I thought the patient was supposed to be on 80 mg?  Can we call him and see which when he is taking.  I think this may be a pharmacy automatic refill the old medicine.

## 2017-12-10 NOTE — Telephone Encounter (Signed)
Left pt a vm msg to return call back regarding current dose of valsartan med. Call back info provided.

## 2017-12-14 ENCOUNTER — Telehealth: Payer: Self-pay

## 2017-12-14 NOTE — Telephone Encounter (Signed)
Pt called stating he was unable to get his rx regarding saxenda. Pt was informed by [facility] pharmacist saxenda rx pending prior authorization. Pls contact pt with any updates. Thanks.

## 2017-12-14 NOTE — Telephone Encounter (Signed)
Contacted pt - as per pt currently taking valsartan 80 mg daily. Pt believes rx request was automatic. Refill request for valsartan 40 mg denied.

## 2017-12-15 NOTE — Telephone Encounter (Signed)
Information has been sent to insurance and waiting on response. 

## 2017-12-16 NOTE — Telephone Encounter (Signed)
Received fax from [place] that [employer] [employer] has been approved from 12/15/2017 through 04/14/2018. Form sent to scan and pharmacy aware-hsm.

## 2017-12-20 ENCOUNTER — Other Ambulatory Visit: Payer: Self-pay | Admitting: Osteopathic Medicine

## 2017-12-20 DIAGNOSIS — I10 Essential (primary) hypertension: Secondary | ICD-10-CM

## 2017-12-20 DIAGNOSIS — E119 Type 2 diabetes mellitus without complications: Secondary | ICD-10-CM

## 2017-12-25 ENCOUNTER — Other Ambulatory Visit: Payer: Self-pay | Admitting: Osteopathic Medicine

## 2017-12-25 DIAGNOSIS — E119 Type 2 diabetes mellitus without complications: Secondary | ICD-10-CM

## 2017-12-25 DIAGNOSIS — I10 Essential (primary) hypertension: Secondary | ICD-10-CM

## 2017-12-27 NOTE — Telephone Encounter (Signed)
Rx refill   [facility] not fill for this provider. 

## 2018-01-03 ENCOUNTER — Ambulatory Visit (INDEPENDENT_AMBULATORY_CARE_PROVIDER_SITE_OTHER): Payer: Managed Care, Other (non HMO) | Admitting: Osteopathic Medicine

## 2018-01-03 ENCOUNTER — Encounter: Payer: Self-pay | Admitting: Osteopathic Medicine

## 2018-01-03 ENCOUNTER — Ambulatory Visit: Payer: Self-pay

## 2018-01-03 VITALS — BP 143/92 | HR 98 | Temp 98.3°F | Wt 202.5 lb

## 2018-01-03 DIAGNOSIS — I10 Essential (primary) hypertension: Secondary | ICD-10-CM

## 2018-01-03 DIAGNOSIS — Z1211 Encounter for screening for malignant neoplasm of colon: Secondary | ICD-10-CM

## 2018-01-03 DIAGNOSIS — Z23 Encounter for immunization: Secondary | ICD-10-CM | POA: Diagnosis not present

## 2018-01-03 DIAGNOSIS — E119 Type 2 diabetes mellitus without complications: Secondary | ICD-10-CM | POA: Diagnosis not present

## 2018-01-03 DIAGNOSIS — E785 Hyperlipidemia, unspecified: Secondary | ICD-10-CM

## 2018-01-03 DIAGNOSIS — E1169 Type 2 diabetes mellitus with other specified complication: Secondary | ICD-10-CM

## 2018-01-03 LAB — POCT GLYCOSYLATED HEMOGLOBIN (HGB A1C): Hemoglobin A1C: 6.8 % — AB (ref 4.0–5.6)

## 2018-01-03 MED ORDER — SEMAGLUTIDE(0.25 OR 0.5MG/DOS) 2 MG/1.5ML ~~LOC~~ SOPN: 0.5000 mg | PEN_INJECTOR | SUBCUTANEOUS | 5 refills | Status: DC

## 2018-01-03 MED ORDER — VALSARTAN 160 MG PO TABS: 160.0000 mg | ORAL_TABLET | Freq: Every day | ORAL | 1 refills | Status: DC

## 2018-01-03 NOTE — Progress Notes (Signed)
HPI: Justin Frank is a 62 y.o. male who  has a past medical history of Asthma, Controlled type 2 diabetes mellitus without complication, without long-term current use of insulin ([employer]) (03/22/2017), ED (erectile dysfunction), Hyperlipidemia, and Hypertension.  he presents to [facility] today, 01/03/18,  for chief complaint of:  Follow-up: DM2, BP, weight  DIABETES SCREENING/PREVENTIVE CARE: A1C past 3-6 mos: Yes  controlled? Yes   03/15/17: 6.9%, we started Metformin XR 500 mg daily  06/14/17: 6.7%  01/03/18: 6.8% on Metformin XR 500 mg daily, Liraglutide 3 mg daily. BP goal <130/80: No - on Amlodipine 10 mg, Valsartan 80 mg. No CP/SOB, no HA/VC, no dizziness, no LE swelling  LDL goal <70: 71 as of 03/2017 Eye exam annually: No record on file, importance discussed with patient Foot exam: none today Microalbuminuria:Yes  Metformin: Yes  ACE/ARB: Yes Antiplatelet if ASCVD Risk >10%: started today   Statin: Yes  Pneumovax: given 06/14/17   The 10-year ASCVD risk score Mikey Bussing DC Jr., et al., 2013) is: 26.1%   Values used to calculate the score:     Age: 3 years     Sex: Male     Is Non-Hispanic African American: Yes     Diabetic: Yes     Tobacco smoker: No     Systolic Blood Pressure: 355 mmHg     Is BP treated: Yes     HDL Cholesterol: 61 mg/dL     Total Cholesterol: 153 mg/dL  BP Readings from Last 3 Encounters:  01/03/18 (!) 143/92  10/26/17 (!) 144/85  07/12/17 (!) 141/85   Wt Readings from Last 3 Encounters:  01/03/18 202 lb 8 oz (91.9 kg)  10/26/17 206 lb (93.4 kg)  07/12/17 202 lb (91.6 kg)      Patient is accompanied by wife, Wilford Sports, who assists with history-taking.   Past medical history, surgical history, and family history reviewed.  Current medication list and allergy/intolerance information reviewed.   (See remainder of HPI, ROS, Phys Exam below)    ASSESSMENT/PLAN: The primary encounter diagnosis was  Controlled type 2 diabetes mellitus without complication, without long-term current use of insulin ([place]). Diagnoses of Need for influenza vaccination, Essential hypertension, Colon cancer screening, and Hyperlipidemia associated with type 2 diabetes mellitus ([place]) were also pertinent to this visit.   A1c could be better  Work on BP as well, so close to goal!    Meds ordered this encounter  Medications  . valsartan (DIOVAN) 160 MG tablet    Sig: Take 1 tablet (160 mg total) by mouth daily.    Dispense:  90 tablet    Refill:  1  . Semaglutide,0.25 or 0.5MG /DOS, (OZEMPIC, 0.25 OR 0.5 MG/DOSE,) 2 MG/1.5ML SOPN    Sig: Inject 0.5 mg into the skin once a week.    Dispense:  4 pen    Refill:  5   Orders Placed This Encounter  Procedures  . Flu Vaccine QUAD 6+ mos PF IM (Fluarix Quad PF)  . CBC  . COMPLETE METABOLIC PANEL WITH GFR  . Lipid panel  . TSH  . Ambulatory referral to Gastroenterology  . POCT HgB A1C    Patient Instructions  Plan:  Blood pressure: increase Valsartan from 80 mg to 160 mg (can double pills you have until they're out, I sent higher dose to pharmacy).   Sugars/weight: continue Saxenda for now, will see if we can get Ozempic once weekly injection covered which may help A1C as well as  weight   Referral sent for colonoscopy    Follow-up plan: Return in about 2 weeks (around 01/17/2018) for recheck blood pressure adnd discuss lab results w/ Dr Sheppard Coil.                                     ############################################ ############################################ ############################################ ############################################    Outpatient Encounter Medications as of 01/03/2018  Medication Sig Note  . albuterol (PROVENTIL HFA;VENTOLIN HFA) 108 (90 Base) MCG/ACT inhaler Inhale 2 puffs into the lungs every 6 (six) hours as needed for wheezing.   Marland Kitchen amLODipine (NORVASC) 10 MG tablet  Take 1 tablet (10 mg total) by mouth daily.   Marland Kitchen atorvastatin (LIPITOR) 10 MG tablet Take 1 tablet (10 mg total) by mouth daily.   Marland Kitchen azithromycin (ZITHROMAX) 250 MG tablet Take 1 tablet (250 mg total) by mouth daily. Take 1 tablet every day until finished.   . cetirizine (ZYRTEC) 10 MG tablet Take 1 tablet (10 mg total) by mouth daily.   . cyclobenzaprine (FLEXERIL) 10 MG tablet Take 0.5-1 tablets (5-10 mg total) by mouth 3 (three) times daily as needed for muscle spasms. caution may cause sedation   . fluticasone (FLONASE) 50 MCG/ACT nasal spray SHAKE LIQUID AND USE 1 SPRAY IN EACH NOSTRIL EVERY DAY   . fluticasone furoate-vilanterol (BREO ELLIPTA) 100-25 MCG/INH AEPB INHALE 1 PUFF INTO THE LUNGS DAILY   . hydrOXYzine (ATARAX/VISTARIL) 25 MG tablet Take 1 tablet (25 mg total) by mouth every 6 (six) hours. 03/15/2017: PRN  . Liraglutide -Weight Management (SAXENDA) 18 MG/3ML SOPN Inject 3 mg into the skin daily.   . metFORMIN (GLUCOPHAGE XR) 500 MG 24 hr tablet Take 1 tablet (500 mg total) by mouth daily with breakfast.   . naproxen (NAPROSYN) 500 MG tablet Take 1 tablet (500 mg total) by mouth 2 (two) times daily with a meal. Take daily for one week then as needed after that for aches/pains   . omeprazole (PRILOSEC) 40 MG capsule Take 1 capsule (40 mg total) by mouth daily.   . predniSONE (DELTASONE) 20 MG tablet Take 2 tabs daily x 4 days   . valsartan (DIOVAN) 80 MG tablet TAKE 1 TABLET BY MOUTH DAILY.- CALL OFFICE FOR APPOINTMENT    No facility-administered encounter medications on file as of 01/03/2018.    Allergies  Allergen Reactions  . Atorvastatin Swelling    " lip swelling " " lip swelling "   . Lisinopril Cough    Other reaction(s): Cough (ALLERGY/intolerance)      Review of Systems:  Constitutional: No recent illness  HEENT: No  headache, no vision change  Cardiac: No  chest pain, No  pressure, No palpitations  Respiratory:  No  shortness of breath. No   Cough  Gastrointestinal: No  abdominal pain, no change on bowel habits  Musculoskeletal: No new myalgia/arthralgia  Skin: No  Rash  Hem/Onc: No  easy bruising/bleeding, No  abnormal lumps/bumps  Neurologic: No  weakness, No  Dizziness  Psychiatric: No  concerns with depression, No  concerns with anxiety  Exam:  BP (!) 143/92 (BP Location: Left Arm, Patient Position: Sitting, Cuff Size: Large)   Pulse 98   Temp 98.3 F (36.8 C) (Oral)   Wt 202 lb 8 oz (91.9 kg)   BMI 34.76 kg/m   Constitutional: VS see above. General Appearance: alert, well-developed, well-nourished, NAD  Eyes: Normal lids and conjunctive, non-icteric sclera  Ears,  Nose, Mouth, Throat: MMM, Normal external inspection ears/nares/mouth/lips/gums.  Neck: No masses, trachea midline.   Respiratory: Normal respiratory effort. no wheeze, no rhonchi, no rales  Cardiovascular: S1/S2 normal, no murmur, no rub/gallop auscultated. RRR.   Musculoskeletal: Gait normal. Symmetric and independent movement of all extremities  Neurological: Normal balance/coordination. No tremor.  Skin: warm, dry, intact.   Psychiatric: Normal judgment/insight. Normal mood and affect. Oriented x3.   Visit summary with medication list and pertinent instructions was printed for patient to review, advised to alert [place] if any changes needed. All questions at time of visit were answered - patient instructed to contact office with any additional concerns. ER/RTC precautions were reviewed with the patient and understanding verbalized.   Follow-up plan: Return in about 2 weeks (around 01/17/2018) for recheck blood pressure adnd discuss lab results w/ Dr Sheppard Coil.  Note: Total time spent 25 minutes, greater than 50% of the visit was spent face-to-face counseling and coordinating care for the following: The primary encounter diagnosis was Controlled type 2 diabetes mellitus without complication, without long-term current use of insulin ([place]).  Diagnoses of Need for influenza vaccination, Essential hypertension, Colon cancer screening, and Hyperlipidemia associated with type 2 diabetes mellitus ([place]) were also pertinent to this visit.Marland Kitchen  Please note: voice recognition software was used to produce this document, and typos may escape review. Please contact Dr. Sheppard Coil for any needed clarifications.

## 2018-01-03 NOTE — Patient Instructions (Addendum)
Plan:  Blood pressure: increase Valsartan from 80 mg to 160 mg (can double pills you have until they're out, I sent higher dose to pharmacy).   Sugars/weight: continue Saxenda for now, will see if we can get Ozempic once weekly injection covered which may help A1C as well as weight   Referral sent for colonoscopy

## 2018-01-04 ENCOUNTER — Other Ambulatory Visit: Payer: Self-pay | Admitting: Sports Medicine

## 2018-01-04 ENCOUNTER — Telehealth: Payer: Self-pay | Admitting: Gastroenterology

## 2018-01-04 DIAGNOSIS — I10 Essential (primary) hypertension: Secondary | ICD-10-CM

## 2018-01-04 DIAGNOSIS — E119 Type 2 diabetes mellitus without complications: Secondary | ICD-10-CM

## 2018-01-04 LAB — COMPLETE METABOLIC PANEL WITH GFR
AG Ratio: 1.5 (calc) (ref 1.0–2.5)
ALT: 18 U/L (ref 9–46)
AST: 24 U/L (ref 10–35)
Albumin: 4.4 g/dL (ref 3.6–5.1)
Alkaline phosphatase (APISO): 79 U/L (ref 40–115)
BUN: 15 mg/dL (ref 7–25)
CO2: 29 mmol/L (ref 20–32)
Calcium: 9.8 mg/dL (ref 8.6–10.3)
Chloride: 103 mmol/L (ref 98–110)
Creat: 1.15 mg/dL (ref 0.70–1.25)
GFR, Est African American: 80 mL/min/{1.73_m2} (ref >=60)
GFR, Est Non African American: 69 mL/min/{1.73_m2} (ref >=60)
Globulin: 2.9 g/dL (calc) (ref 1.9–3.7)
Glucose, Bld: 76 mg/dL (ref 65–99)
Potassium: 4.1 mmol/L (ref 3.5–5.3)
Sodium: 140 mmol/L (ref 135–146)
Total Bilirubin: 0.7 mg/dL (ref 0.2–1.2)
Total Protein: 7.3 g/dL (ref 6.1–8.1)

## 2018-01-04 LAB — LIPID PANEL
Cholesterol: 180 mg/dL (ref <200)
HDL: 62 mg/dL (ref >40)
LDL Cholesterol (Calc): 102 mg/dL (calc) — ABNORMAL HIGH
Non-HDL Cholesterol (Calc): 118 mg/dL (calc) (ref <130)
Total CHOL/HDL Ratio: 2.9 (calc) (ref <5.0)
Triglycerides: 73 mg/dL (ref <150)

## 2018-01-04 LAB — CBC
HCT: 42.4 % (ref 38.5–50.0)
Hemoglobin: 13.5 g/dL (ref 13.2–17.1)
MCH: 22.2 pg — ABNORMAL LOW (ref 27.0–33.0)
MCHC: 31.8 g/dL — ABNORMAL LOW (ref 32.0–36.0)
MCV: 69.6 fL — ABNORMAL LOW (ref 80.0–100.0)
MPV: 9.6 fL (ref 7.5–12.5)
Platelets: 267 10*3/uL (ref 140–400)
RBC: 6.09 10*6/uL — ABNORMAL HIGH (ref 4.20–5.80)
RDW: 16.3 % — ABNORMAL HIGH (ref 11.0–15.0)
WBC: 8.3 10*3/uL (ref 3.8–10.8)

## 2018-01-04 LAB — TSH: TSH: 1.79 mIU/L (ref 0.40–4.50)

## 2018-01-04 NOTE — Telephone Encounter (Signed)
To PCP

## 2018-01-04 NOTE — Telephone Encounter (Signed)
I reviewed his records. Colonoscopy in 2015 (results reviewed) and 2018 (mentioned in comments) with small tubular adenomas. Surveillance recommended in 3 years. However, I am unable to see the procedure or path reports from 2018. Please enter the patient in surveillance for 2021 and obtain a copy of the 2018 procedure and path reports. Thank you.

## 2018-01-04 NOTE — Telephone Encounter (Signed)
Received referral from PCP.  Patient has Gi hx (records in [place])  Spoke with patient and he is wanting to switch care because his PCP recommended [facility] and it is closer for him.  Could you please review records in Epic and advice on scheduling?  Thanks

## 2018-01-05 NOTE — Telephone Encounter (Signed)
Christy, Please contact patient and let him know that as of now Dr. Payton Emerald is recommending repeat colonoscopy in 2021. I have put the recall in our system. We are unable to see 2018 colonoscopy report and Path report, can you have patient contact where this was done and have them fax to Dr. Payton Emerald attention. Thank you.

## 2018-01-05 NOTE — Telephone Encounter (Signed)
Called and spoke with patient and informed him of Dr. Payton Emerald recommendations.  He will try to locate his colon report from 2018 and it sent for review

## 2018-01-17 ENCOUNTER — Encounter: Payer: Self-pay | Admitting: Osteopathic Medicine

## 2018-01-17 ENCOUNTER — Ambulatory Visit (INDEPENDENT_AMBULATORY_CARE_PROVIDER_SITE_OTHER): Payer: Managed Care, Other (non HMO) | Admitting: Osteopathic Medicine

## 2018-01-17 VITALS — BP 116/80 | HR 85 | Temp 98.4°F | Wt 199.0 lb

## 2018-01-17 DIAGNOSIS — E119 Type 2 diabetes mellitus without complications: Secondary | ICD-10-CM | POA: Diagnosis not present

## 2018-01-17 DIAGNOSIS — I10 Essential (primary) hypertension: Secondary | ICD-10-CM | POA: Diagnosis not present

## 2018-01-17 NOTE — Progress Notes (Signed)
HPI: Justin Frank is a 62 y.o. male who  has a past medical history of Asthma, Controlled type 2 diabetes mellitus without complication, without long-term current use of insulin ([place]) (03/22/2017), ED (erectile dysfunction), Hyperlipidemia, and Hypertension.  he presents to [facility] today, 01/17/18,  for chief complaint of:  BP and weight recheck   Last visit we increased valsartan to 160 mg, tolerating new dose well. BP looking a lot better today. NO dizziness/faintness, no CP/SOB, no HA/VC BP Readings from Last 3 Encounters:  01/17/18 116/80  01/03/18 (!) 143/92  10/26/17 (!) 144/85   DM2 and obesity - started Ozempic last visit, he is using up the last of the Saxenda so far.  Wt Readings from Last 3 Encounters:  01/17/18 199 lb (90.3 kg)  01/03/18 202 lb 8 oz (91.9 kg)  10/26/17 206 lb (93.4 kg)       At today's visit... Past medical history, surgical history, and family history reviewed and updated as needed.  Current medication list and allergy/intolerance information reviewed and updated as needed. (See remainder of HPI, ROS, Phys Exam below)          ASSESSMENT/PLAN: The primary encounter diagnosis was Controlled type 2 diabetes mellitus without complication, without long-term current use of insulin ([place]). A diagnosis of Essential hypertension was also pertinent to this visit.   Doing well!   Unsure of colonoscopy f/u - was pretty confident that he's due 3 yrs after last scope d/t polyps, will recquest records       Follow-up plan: Return in about 3 months (around 04/19/2018) for follow-up A1C, BP and weight check .                             ############################################ ############################################ ############################################ ############################################    No outpatient medications have been marked as taking for the  01/17/18 encounter (Appointment) with Emeterio Reeve, DO.    Allergies  Allergen Reactions  . Atorvastatin Swelling    " lip swelling "    . Lisinopril Cough    Other reaction(s): Cough (ALLERGY/intolerance)       Review of Systems:  Constitutional: No recent illness  HEENT: No  headache, no vision change  Cardiac: No  chest pain, No  pressure, No palpitations  Respiratory:  No  shortness of breath. No  Cough  Neurologic: No  weakness, No  Dizziness   Exam:  BP 116/80 (BP Location: Left Arm, Patient Position: Sitting, Cuff Size: Large)   Pulse 85   Temp 98.4 F (36.9 C) (Oral)   Wt 199 lb (90.3 kg)   BMI 34.16 kg/m   Constitutional: VS see above. General Appearance: alert, well-developed, well-nourished, NAD  Eyes: Normal lids and conjunctive, non-icteric sclera  Ears, Nose, Mouth, Throat: MMM, Normal external inspection ears/nares/mouth/lips/gums.  Neck: No masses, trachea midline.   Respiratory: Normal respiratory effort. no wheeze, no rhonchi, no rales  Cardiovascular: S1/S2 normal, no murmur, no rub/gallop auscultated. RRR.   Musculoskeletal: Gait normal. Symmetric and independent movement of all extremities  Neurological: Normal balance/coordination. No tremor.  Skin: warm, dry, intact.   Psychiatric: Normal judgment/insight. Normal mood and affect. Oriented x3.       Visit summary with medication list and pertinent instructions was printed for patient to review, patient was advised to alert [place] if any updates are needed. All questions at time of visit were answered - patient instructed to contact office with any additional  concerns. ER/RTC precautions were reviewed with the patient and understanding verbalized.   Note: Total time spent 15 minutes, greater than 50% of the visit was spent face-to-face counseling and coordinating care for the following: The primary encounter diagnosis was Controlled type 2 diabetes mellitus without complication,  without long-term current use of insulin ([place]). A diagnosis of Essential hypertension was also pertinent to this visit.Marland Kitchen  Please note: voice recognition software was used to produce this document, and typos may escape review. Please contact Dr. Sheppard Coil for any needed clarifications.    Follow up plan: Return in about 3 months (around 04/19/2018) for follow-up A1C, BP and weight check .

## 2018-01-28 ENCOUNTER — Encounter: Payer: Self-pay | Admitting: Osteopathic Medicine

## 2018-02-11 ENCOUNTER — Encounter: Payer: Self-pay | Admitting: Osteopathic Medicine

## 2018-02-11 DIAGNOSIS — I10 Essential (primary) hypertension: Secondary | ICD-10-CM

## 2018-02-11 DIAGNOSIS — Z6835 Body mass index (BMI) 35.0-35.9, adult: Secondary | ICD-10-CM

## 2018-02-11 DIAGNOSIS — E119 Type 2 diabetes mellitus without complications: Secondary | ICD-10-CM

## 2018-02-15 MED ORDER — LIRAGLUTIDE -WEIGHT MANAGEMENT 18 MG/3ML ~~LOC~~ SOPN: 3.0000 mg | PEN_INJECTOR | Freq: Every day | SUBCUTANEOUS | 3 refills | Status: DC

## 2018-02-17 ENCOUNTER — Encounter: Payer: Self-pay | Admitting: Osteopathic Medicine

## 2018-03-19 ENCOUNTER — Encounter: Payer: Self-pay | Admitting: Osteopathic Medicine

## 2018-04-14 ENCOUNTER — Ambulatory Visit: Payer: Managed Care, Other (non HMO) | Admitting: Osteopathic Medicine

## 2018-04-22 ENCOUNTER — Ambulatory Visit: Payer: Managed Care, Other (non HMO) | Admitting: Osteopathic Medicine

## 2018-04-22 IMAGING — DX DG CHEST 2V
2 series · 2 of 2 positions shown · non-contrast
Comparison: 02/28/2017 chest radiograph

CLINICAL DATA: 61 y/o  M; persistent cough since [DATE].

EXAM:
CHEST  2 VIEW

[chest pa]
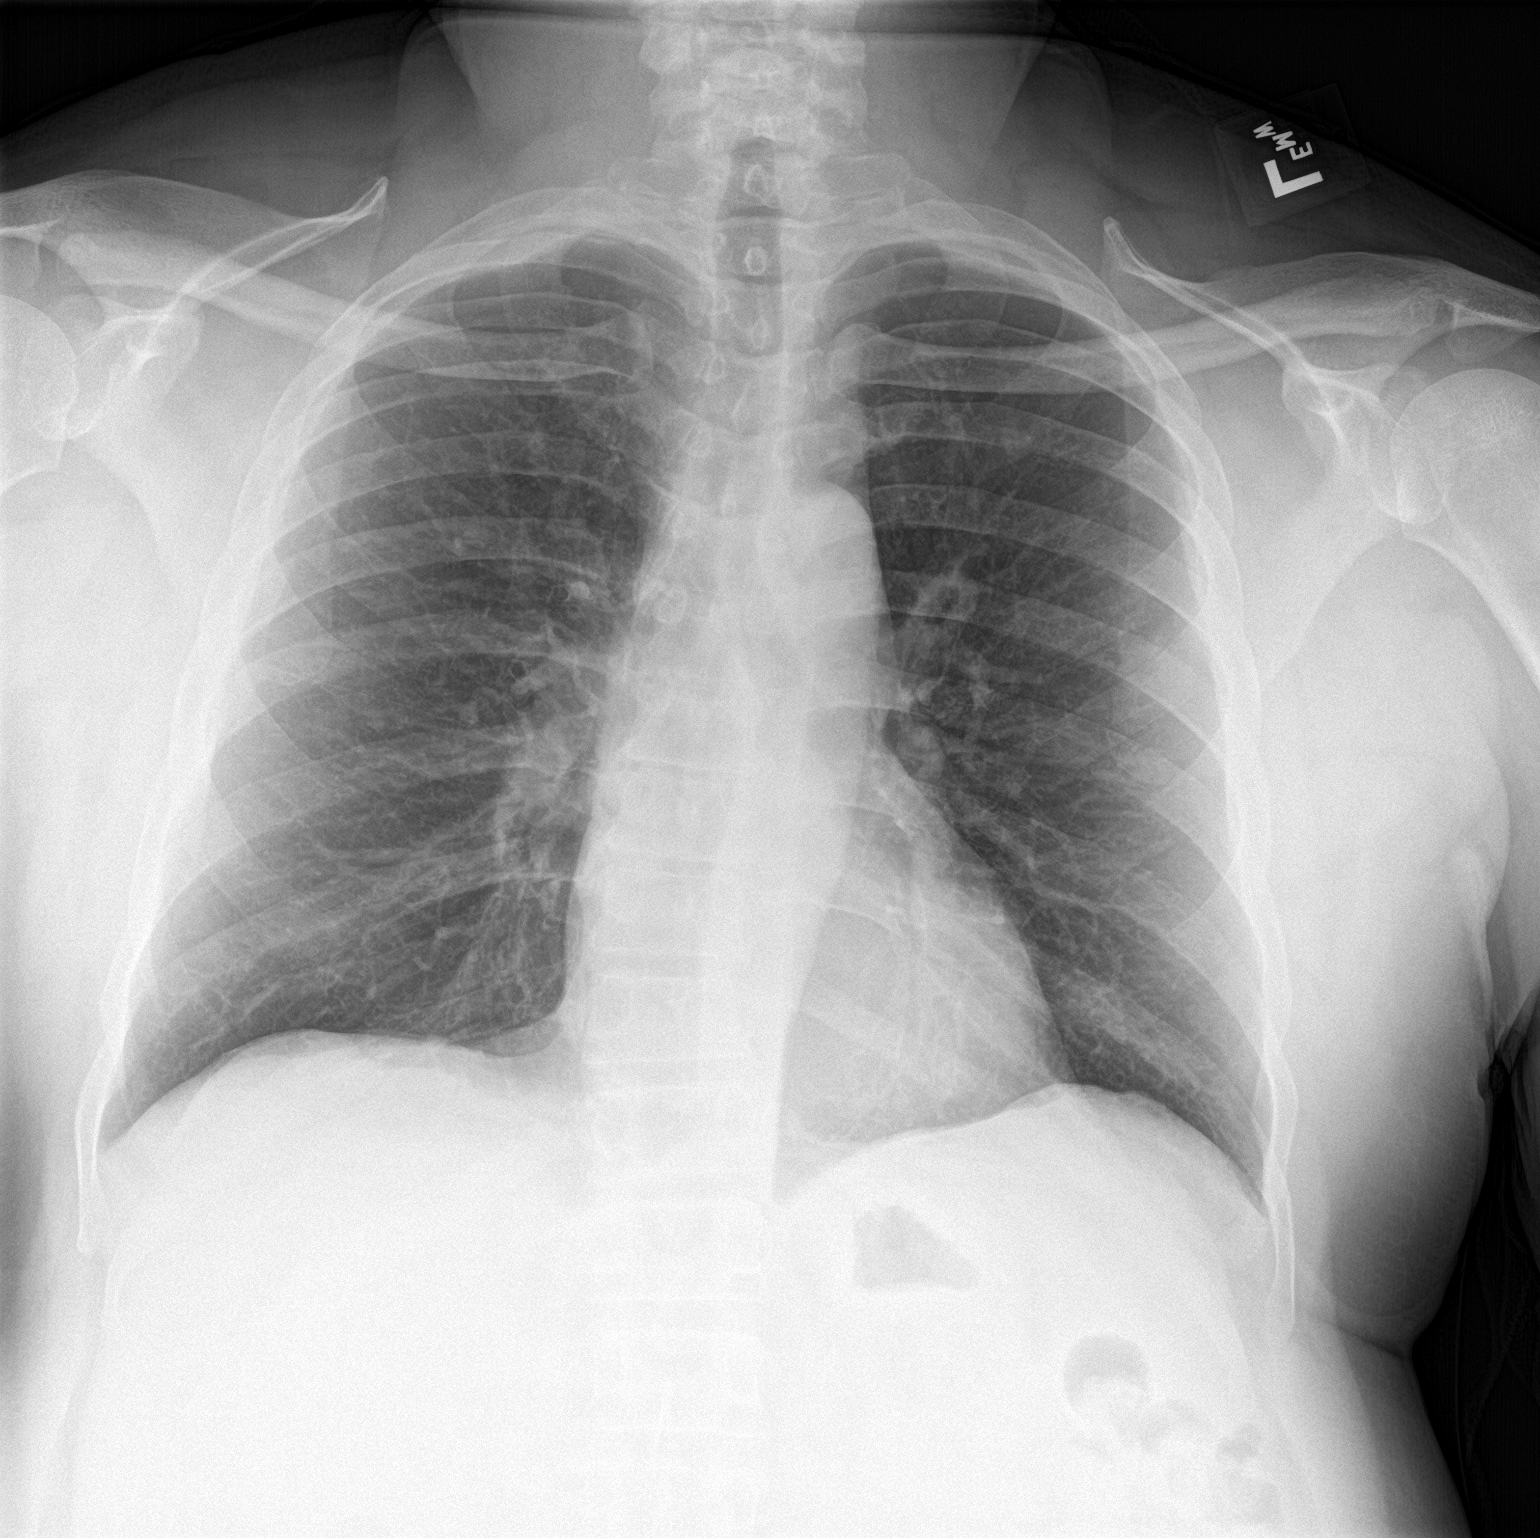

[chest lat]
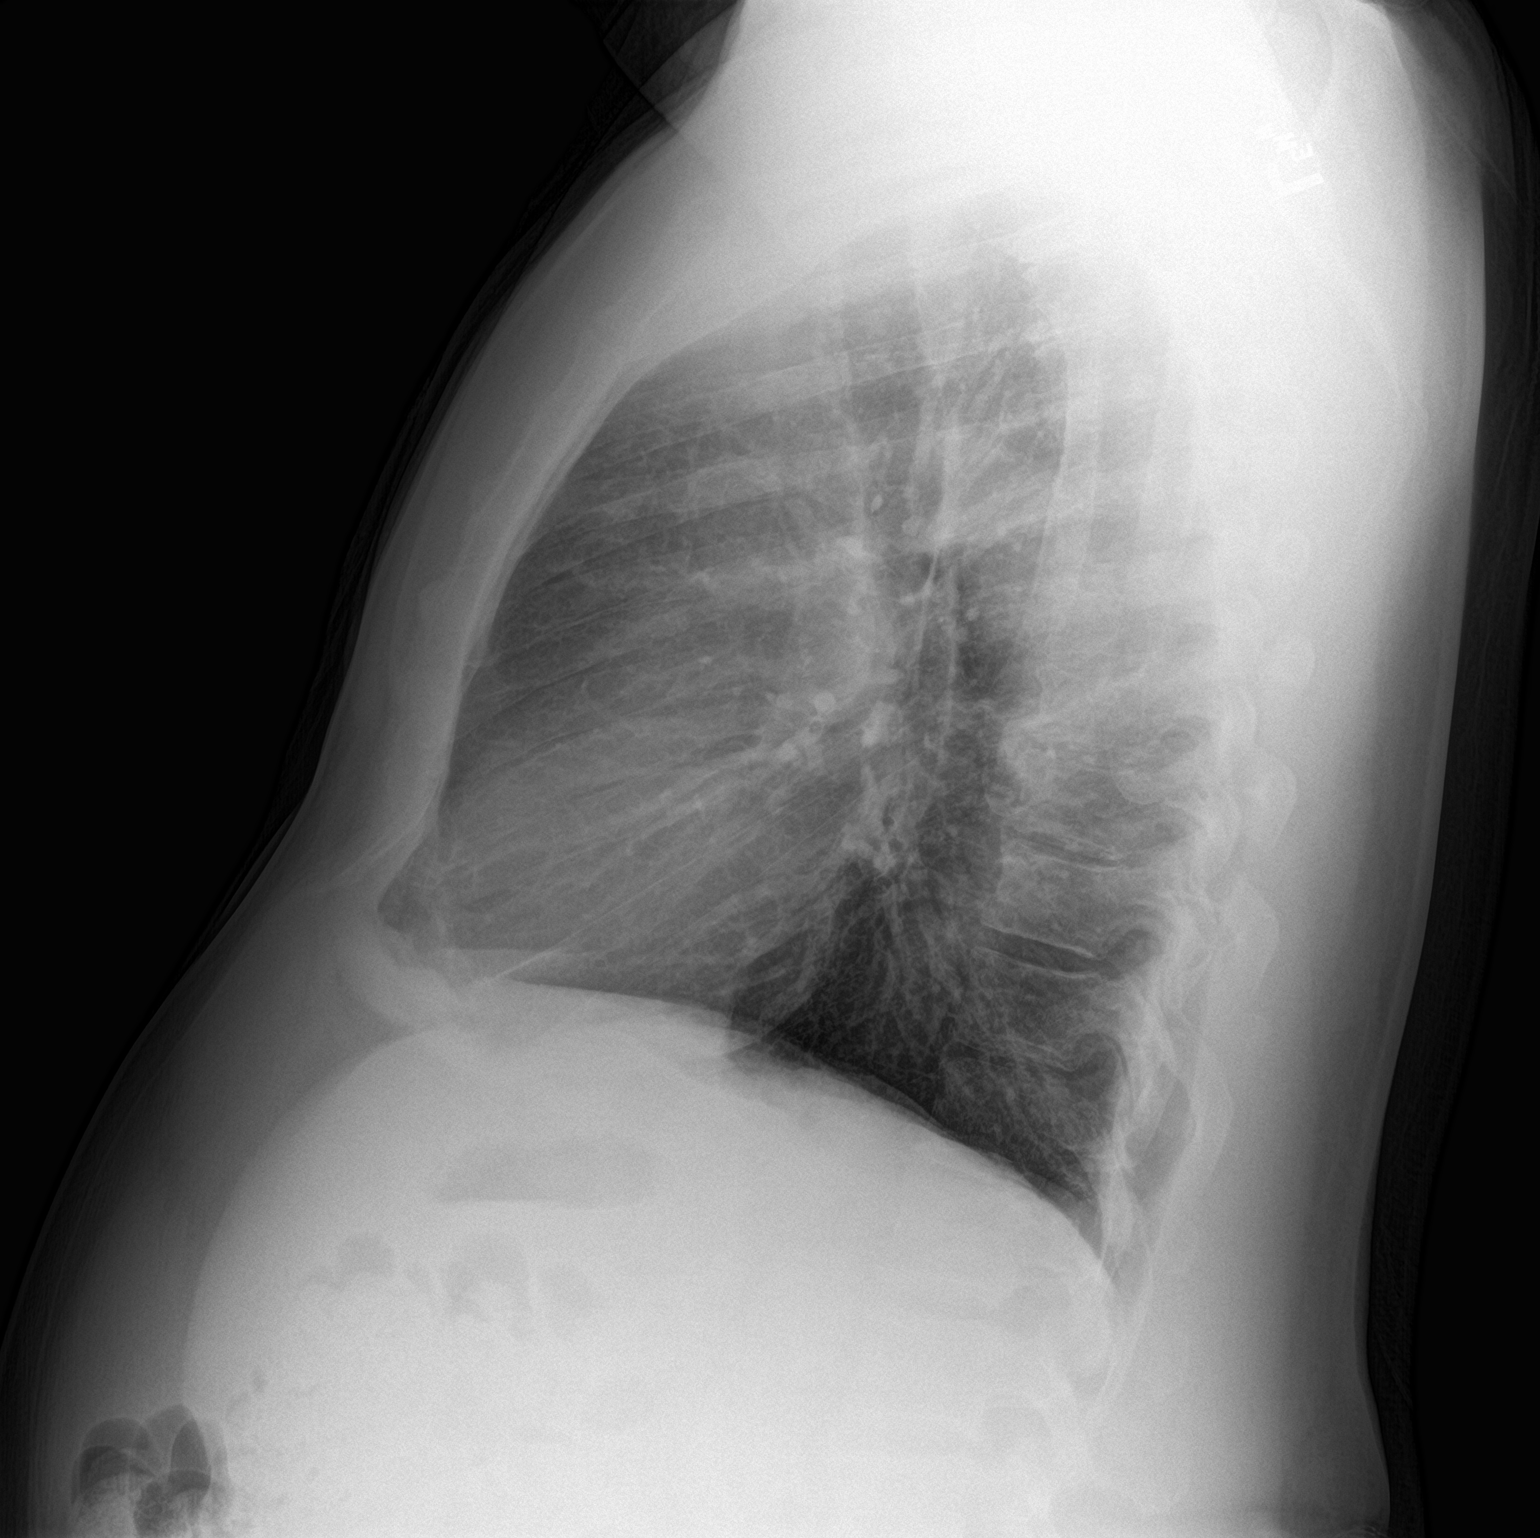

[2 of 2 positions shown; findings below may reference images not displayed]

FINDINGS: Stable heart size and mediastinal contours are within normal limits.
Previously noted nodular opacity is no longer identified, likely
nipple shadow. Both lungs are clear. Mild thoracic spine
dextrocurvature.
IMPRESSION: No acute pulmonary process identified.

By: Keyvan Dewar M.D.

## 2018-04-27 ENCOUNTER — Encounter: Payer: Self-pay | Admitting: Osteopathic Medicine

## 2018-04-27 ENCOUNTER — Ambulatory Visit (INDEPENDENT_AMBULATORY_CARE_PROVIDER_SITE_OTHER): Payer: Managed Care, Other (non HMO) | Admitting: Osteopathic Medicine

## 2018-04-27 ENCOUNTER — Ambulatory Visit (INDEPENDENT_AMBULATORY_CARE_PROVIDER_SITE_OTHER): Payer: Managed Care, Other (non HMO)

## 2018-04-27 VITALS — BP 143/81 | HR 105 | Temp 101.6°F | Wt 199.7 lb

## 2018-04-27 DIAGNOSIS — R6889 Other general symptoms and signs: Secondary | ICD-10-CM | POA: Diagnosis not present

## 2018-04-27 DIAGNOSIS — M25551 Pain in right hip: Secondary | ICD-10-CM | POA: Diagnosis not present

## 2018-04-27 LAB — POCT INFLUENZA A/B
Influenza A, POC: NEGATIVE
Influenza B, POC: NEGATIVE

## 2018-04-27 MED ORDER — OXYCODONE-ACETAMINOPHEN 5-325 MG PO TABS: 1.0000 | ORAL_TABLET | Freq: Four times a day (QID) | ORAL | 0 refills | Status: DC | PRN

## 2018-04-27 MED ORDER — OXYCODONE-ACETAMINOPHEN 5-325 MG PO TABS: 1.0000 | ORAL_TABLET | Freq: Four times a day (QID) | ORAL | 0 refills | Status: AC | PRN

## 2018-04-27 NOTE — Progress Notes (Signed)
HPI: Justin Frank is a 63 y.o. male who  has a past medical history of Asthma, Controlled type 2 diabetes mellitus without complication, without long-term current use of insulin ([place]) (03/22/2017), ED (erectile dysfunction), Hyperlipidemia, and Hypertension.  he presents to [facility] today, 04/27/18,  for chief complaint of: Sick - concern for flu Hip pain   . Context: had flu shot this season, UTD on pneumonia vaccine  . Location/Quality: body aches generalized, fever, night sweats . Duration: started last night  . Assoc signs/symptoms: hip pain, ongoing for months but really worse past couple days, see Dr Clovis Riley note for full details.    Patient is accompanied by wife who assists with history-taking.      Past medical, surgical, social and family history reviewed and updated as necessary.   Current medication list and allergy/intolerance information reviewed:    Current Outpatient Medications  Medication Sig Dispense Refill  . albuterol (PROVENTIL HFA;VENTOLIN HFA) 108 (90 Base) MCG/ACT inhaler Inhale 2 puffs into the lungs every 6 (six) hours as needed for wheezing. 2 Inhaler 11  . amLODipine (NORVASC) 10 MG tablet Take 1 tablet (10 mg total) by mouth daily. 90 tablet 3  . atorvastatin (LIPITOR) 10 MG tablet Take 1 tablet (10 mg total) by mouth daily. 90 tablet 3  . cetirizine (ZYRTEC) 10 MG tablet Take 1 tablet (10 mg total) by mouth daily. 90 tablet 3  . cyclobenzaprine (FLEXERIL) 10 MG tablet Take 0.5-1 tablets (5-10 mg total) by mouth 3 (three) times daily as needed for muscle spasms. caution may cause sedation 30 tablet 0  . fluticasone (FLONASE) 50 MCG/ACT nasal spray SHAKE LIQUID AND USE 1 SPRAY IN EACH NOSTRIL EVERY DAY 16 g 11  . fluticasone furoate-vilanterol (BREO ELLIPTA) 100-25 MCG/INH AEPB INHALE 1 PUFF INTO THE LUNGS DAILY 28 each 11  . hydrOXYzine (ATARAX/VISTARIL) 25 MG tablet Take 1 tablet (25 mg total) by mouth every 6  (six) hours. 12 tablet 0  . Liraglutide -Weight Management (SAXENDA) 18 MG/3ML SOPN Inject 3 mg into the skin daily. 9 pen 3  . metFORMIN (GLUCOPHAGE XR) 500 MG 24 hr tablet Take 1 tablet (500 mg total) by mouth daily with breakfast. 90 tablet 3  . naproxen (NAPROSYN) 500 MG tablet Take 1 tablet (500 mg total) by mouth 2 (two) times daily with a meal. Take daily for one week then as needed after that for aches/pains 30 tablet 0  . omeprazole (PRILOSEC) 40 MG capsule Take 1 capsule (40 mg total) by mouth daily. 90 capsule 3  . valsartan (DIOVAN) 160 MG tablet Take 1 tablet (160 mg total) by mouth daily. 90 tablet 1   No current facility-administered medications for this visit.     Allergies  Allergen Reactions  . Atorvastatin Swelling    " lip swelling "    . Lisinopril Cough    Other reaction(s): Cough (ALLERGY/intolerance)      Review of Systems:  Constitutional:  +fever, +chills, +recent illness, No unintentional weight changes. +significant fatigue.   HEENT: +headache, no vision change, no hearing change, +sore throat, +sinus pressure  Cardiac: No  chest pain, No  pressure, No palpitations  Respiratory:  No  shortness of breath. +Cough  Gastrointestinal: No  abdominal pain, No  nausea, No  vomiting,  No  blood in stool, No  diarrhea  Musculoskeletal: +myalgia/arthralgia  Skin: No  Rash  Neurologic: No  weakness, No  dizziness  Exam:  BP (!) 143/81 (BP Location:  Left Arm, Patient Position: Sitting, Cuff Size: Large)   Pulse (!) 105   Temp (!) 101.6 F (38.7 C) (Oral)   Wt 199 lb 11.2 oz (90.6 kg)   SpO2 99%   BMI 34.28 kg/m   Constitutional: VS see above. General Appearance: alert, well-developed, well-nourished, NAD  Eyes: Normal lids and conjunctive, non-icteric sclera  Ears, Nose, Mouth, Throat: MMM, Normal external inspection ears/nares/mouth/lips/gums. TM normal bilaterally. Pharynx/tonsils no erythema, no exudate. Nasal mucosa normal.   Neck: No masses,  trachea midline. No tenderness/mass appreciated. No lymphadenopathy  Respiratory: Normal respiratory effort. no wheeze, no rhonchi, no rales  Cardiovascular: S1/S2 normal, no murmur, no rub/gallop auscultated. RRR. No lower extremity edema.   Gastrointestinal: Nontender, no masses. Bowel sounds normal.  Musculoskeletal: see Dr Clovis Riley note   Neurological: Normal balance/coordination. No tremor.   Skin: warm, dry, intact.   Psychiatric: Normal judgment/insight. Normal mood and affect.   Results for orders placed or performed in visit on 04/27/18 (from the past 72 hour(s))  POCT Influenza A/B     Status: None   Collection Time: 04/27/18  2:17 PM  Result Value Ref Range   Influenza A, POC Negative Negative   Influenza B, POC Negative Negative    No results found.     ASSESSMENT/PLAN: The primary encounter diagnosis was Pain of right hip joint. A diagnosis of Flu-like symptoms was also pertinent to this visit.  Flu negative, possible false negative but really no respiratory or GI symptoms. Concern for septic joint? Appreciate consult from Dr Georgina Snell. Supportive care otherwise for possible viral illness.    Meds ordered this encounter  Medications  . DISCONTD: oxyCODONE-acetaminophen (PERCOCET) 5-325 MG tablet    Sig: Take 1-2 tablets by mouth every 6 (six) hours as needed for up to 5 days for severe pain. Use sparingly to avoid tolerance/dependence    Dispense:  30 tablet    Refill:  0  . oxyCODONE-acetaminophen (PERCOCET) 5-325 MG tablet    Sig: Take 1-2 tablets by mouth every 6 (six) hours as needed for up to 5 days for severe pain. Use sparingly to avoid tolerance/dependence    Dispense:  30 tablet    Refill:  0    Orders Placed This Encounter  Procedures  . DG Hip Unilat W OR W/O Pelvis 2-3 Views Right  . Sedimentation rate  . High sensitivity CRP  . CBC with Differential/Platelet  . POCT Influenza A/B        Visit summary with medication list and pertinent  instructions was printed for patient to review. All questions at time of visit were answered - patient instructed to contact office with any additional concerns or updates. ER/RTC precautions were reviewed with the patient.   Follow-up plan: Return in about 3 months (around 07/26/2018) for A1C recheck . Please note: voice recognition software was used to produce this document, and typos may escape review. Please contact Dr. Sheppard Coil for any needed clarifications.

## 2018-04-27 NOTE — Progress Notes (Signed)
Point-of-care bedside limited musculoskeletal ultrasound right hip shows mild joint effusion. Small effusions are not typically associated with septic arthritis.  Will await results of lab work-up.  Recommend recheck with me in a week or 2.  If lab work-up concerning for septic arthritis will return to clinic this weekend likely proceed with diagnostic joint aspiration.

## 2018-04-28 ENCOUNTER — Encounter: Payer: Self-pay | Admitting: Osteopathic Medicine

## 2018-04-28 MED ORDER — PREDNISONE 20 MG PO TABS: 20.0000 mg | ORAL_TABLET | Freq: Two times a day (BID) | ORAL | 0 refills | Status: DC

## 2018-04-29 ENCOUNTER — Telehealth: Payer: Self-pay | Admitting: Family Medicine

## 2018-04-29 NOTE — Telephone Encounter (Signed)
CRP is a bit elevated.  If having a lot of pain still in the hip patient should return to clinic today for aspiration.  If he is feeling better he can wait to follow-up next week.

## 2018-04-29 NOTE — Telephone Encounter (Signed)
Patient scheduled.

## 2018-04-30 LAB — CBC WITH DIFFERENTIAL/PLATELET
Absolute Monocytes: 815 cells/uL (ref 200–950)
Basophils Absolute: 34 cells/uL (ref 0–200)
Basophils Relative: 0.4 %
Eosinophils Absolute: 958 cells/uL — ABNORMAL HIGH (ref 15–500)
Eosinophils Relative: 11.4 %
HCT: 39.7 % (ref 38.5–50.0)
Hemoglobin: 13 g/dL — ABNORMAL LOW (ref 13.2–17.1)
Lymphs Abs: 1277 cells/uL (ref 850–3900)
MCH: 22.3 pg — ABNORMAL LOW (ref 27.0–33.0)
MCHC: 32.7 g/dL (ref 32.0–36.0)
MCV: 68.2 fL — ABNORMAL LOW (ref 80.0–100.0)
MPV: 9.7 fL (ref 7.5–12.5)
Monocytes Relative: 9.7 %
Neutro Abs: 5317 cells/uL (ref 1500–7800)
Neutrophils Relative %: 63.3 %
Platelets: 242 10*3/uL (ref 140–400)
RBC: 5.82 10*6/uL — ABNORMAL HIGH (ref 4.20–5.80)
RDW: 16 % — ABNORMAL HIGH (ref 11.0–15.0)
Total Lymphocyte: 15.2 %
WBC: 8.4 10*3/uL (ref 3.8–10.8)

## 2018-04-30 LAB — TEST AUTHORIZATION

## 2018-04-30 LAB — HIGH SENSITIVITY CRP: hs-CRP: >10 mg/L — ABNORMAL HIGH

## 2018-04-30 LAB — SEDIMENTATION RATE: Sed Rate: 34 mm/h — ABNORMAL HIGH (ref 0–20)

## 2018-04-30 LAB — HEMOGLOBIN A1C: Hgb A1c MFr Bld: 6 %{Hb} — ABNORMAL HIGH (ref <5.7)

## 2018-05-02 ENCOUNTER — Ambulatory Visit (INDEPENDENT_AMBULATORY_CARE_PROVIDER_SITE_OTHER): Payer: Managed Care, Other (non HMO) | Admitting: Family Medicine

## 2018-05-02 ENCOUNTER — Encounter: Payer: Self-pay | Admitting: Family Medicine

## 2018-05-02 VITALS — BP 142/87 | HR 78 | Wt 198.0 lb

## 2018-05-02 DIAGNOSIS — M1611 Unilateral primary osteoarthritis, right hip: Secondary | ICD-10-CM

## 2018-05-02 DIAGNOSIS — M25551 Pain in right hip: Secondary | ICD-10-CM | POA: Diagnosis not present

## 2018-05-02 NOTE — Progress Notes (Signed)
Justin Frank is a 63 y.o. male who presents to [facility] [place] today for follow-up hip pain.  [name] was seen last week for fever and hip pain.  He been having hip pain prior to the fever but there was some concern for septic joint.  X-rays at the time showed some mild degenerative changes in the right hip.  My bedside ultrasound showed a mild effusion.  Sed rate was mildly elevated and CRP was mildly elevated as well.  That is feeling a lot better today.  His hip pain has reduced significantly.  He still has some mild soreness on the anterior hip but overall is much improved.  He is feeling better overall as well with no further fevers chills or cough.  He works as a Biomedical scientist at [employer] and notes that his hip will be sore after a long day of standing and walking.    ROS as above:  Exam:  BP (!) 142/87   Pulse 78   Wt 198 lb (89.8 kg)   BMI 33.99 kg/m  T= 97.8 Wt Readings from Last 5 Encounters:  05/02/18 198 lb (89.8 kg)  04/27/18 199 lb 11.2 oz (90.6 kg)  01/17/18 199 lb (90.3 kg)  01/03/18 202 lb 8 oz (91.9 kg)  10/26/17 206 lb (93.4 kg)    Gen: Well NAD Right hip: Normal-appearing Patient motion slight limited internal rotation.  Normal external rotational range of motion.  Slight limitation hip flexion as well.  With some pain with internal rotation. Normal hip extension.  Hip abduction strength diminished 4/5.  Normal gait.  Lab and Radiology Results EXAM: DG HIP (WITH OR WITHOUT PELVIS) 3V RIGHT  COMPARISON:  None.  FINDINGS: Pelvic ring is intact. No acute fracture or dislocation is noted. Mild degenerative changes of the hip joints are. No soft tissue abnormality is seen. No acute fracture noted.  IMPRESSION: Mild degenerative change without acute abnormality.   Electronically Signed   By: Inez Catalina M.D.   On: 04/28/2018  09:24 I personally (independently) visualized and performed the interpretation of the images attached in this note.    Assessment and Plan: 63 y.o. male with hip pain.  Patient has some evidence of hip tendinopathy as well as DJD likely causing his pain.  Plan for home exercise program and referral to physical therapy.  Given acetaminophen use as well.  If not better neck step would be interarticular injection.  Recheck as needed.  PDMP not reviewed this encounter. Orders Placed This Encounter  Procedures  . Ambulatory referral to Physical Therapy    Referral Priority:   Routine    Referral Type:   Physical Medicine    Referral Reason:   Specialty Services Required    Requested Specialty:   Physical Therapy   No orders of the defined types were placed in this encounter.    Historical information moved to improve visibility of documentation.  Past Medical History:  Diagnosis Date  . Asthma   . Controlled type 2 diabetes mellitus without complication, without long-term current use of insulin ([place]) 03/22/2017  . ED (erectile dysfunction)   . Hyperlipidemia   . Hypertension    Past Surgical History:  Procedure Laterality Date  . HERNIA REPAIR     As per patient - 2012, 2013, 2015   Social History   Tobacco Use  . Smoking status: Former Smoker    Last attempt to quit: 03/02/1977  Years since quitting: 41.1  . Smokeless tobacco: Never Used  Substance Use Topics  . Alcohol use: No   family history includes Hypertension in his father and mother.  Medications: Current Outpatient Medications  Medication Sig Dispense Refill  . albuterol (PROVENTIL HFA;VENTOLIN HFA) 108 (90 Base) MCG/ACT inhaler Inhale 2 puffs into the lungs every 6 (six) hours as needed for wheezing. 2 Inhaler 11  . amLODipine (NORVASC) 10 MG tablet Take 1 tablet (10 mg total) by mouth daily. 90 tablet 3  . atorvastatin (LIPITOR) 10 MG tablet Take 1 tablet (10 mg total) by mouth daily. 90 tablet 3  .  cetirizine (ZYRTEC) 10 MG tablet Take 1 tablet (10 mg total) by mouth daily. 90 tablet 3  . cyclobenzaprine (FLEXERIL) 10 MG tablet Take 0.5-1 tablets (5-10 mg total) by mouth 3 (three) times daily as needed for muscle spasms. caution may cause sedation 30 tablet 0  . fluticasone (FLONASE) 50 MCG/ACT nasal spray SHAKE LIQUID AND USE 1 SPRAY IN EACH NOSTRIL EVERY DAY 16 g 11  . fluticasone furoate-vilanterol (BREO ELLIPTA) 100-25 MCG/INH AEPB INHALE 1 PUFF INTO THE LUNGS DAILY 28 each 11  . hydrOXYzine (ATARAX/VISTARIL) 25 MG tablet Take 1 tablet (25 mg total) by mouth every 6 (six) hours. 12 tablet 0  . Liraglutide -Weight Management (SAXENDA) 18 MG/3ML SOPN Inject 3 mg into the skin daily. 9 pen 3  . metFORMIN (GLUCOPHAGE XR) 500 MG 24 hr tablet Take 1 tablet (500 mg total) by mouth daily with breakfast. 90 tablet 3  . naproxen (NAPROSYN) 500 MG tablet Take 1 tablet (500 mg total) by mouth 2 (two) times daily with a meal. Take daily for one week then as needed after that for aches/pains 30 tablet 0  . omeprazole (PRILOSEC) 40 MG capsule Take 1 capsule (40 mg total) by mouth daily. 90 capsule 3  . oxyCODONE-acetaminophen (PERCOCET) 5-325 MG tablet Take 1-2 tablets by mouth every 6 (six) hours as needed for up to 5 days for severe pain. Use sparingly to avoid tolerance/dependence 30 tablet 0  . predniSONE (DELTASONE) 20 MG tablet Take 1 tablet (20 mg total) by mouth 2 (two) times daily with a meal. 10 tablet 0  . valsartan (DIOVAN) 160 MG tablet Take 1 tablet (160 mg total) by mouth daily. 90 tablet 1   No current facility-administered medications for this visit.    Allergies  Allergen Reactions  . Atorvastatin Swelling    " lip swelling "    . Lisinopril Cough    Other reaction(s): Cough (ALLERGY/intolerance)     Discussed warning signs or symptoms. Please see discharge instructions. Patient expresses understanding.

## 2018-05-02 NOTE — Patient Instructions (Addendum)
Thank you for coming in today. You should hear from physical therapy.  Use tylenol  As needed for pain.  1000mg  every 6 hours is the max dose.   Work on home exercises and some limited PT.  Let me know if you have problems scheduling physical therapy.   If not getting better next step is injection.   We will recheck as needed.

## 2018-05-17 ENCOUNTER — Telehealth: Payer: Self-pay

## 2018-05-17 NOTE — Telephone Encounter (Signed)
Mark from [employer] called requesting an update on a prior auth. As per rep, it is regarding saxenda 18 mg for pt.

## 2018-05-18 NOTE — Telephone Encounter (Signed)
[id number];ULGSPJ:[id number];Review Type:Prior Auth;Coverage Start Date:04/18/2018;Coverage End Date:05/18/2019; (Saxenda) Waiting on form to scan and will make pharmacy aware.

## 2018-05-18 NOTE — Telephone Encounter (Signed)
Attempted to call the pharmacy 3 times and was disconnected and called back the 4th time and spoke with pharmacist and it was processed. Patient is aware also.

## 2018-05-20 ENCOUNTER — Other Ambulatory Visit: Payer: Self-pay | Admitting: Osteopathic Medicine

## 2018-05-24 ENCOUNTER — Ambulatory Visit: Payer: Managed Care, Other (non HMO)

## 2018-05-26 ENCOUNTER — Ambulatory Visit: Payer: Managed Care, Other (non HMO)

## 2018-06-16 ENCOUNTER — Other Ambulatory Visit: Payer: Self-pay | Admitting: Osteopathic Medicine

## 2018-06-16 DIAGNOSIS — I10 Essential (primary) hypertension: Secondary | ICD-10-CM

## 2018-06-18 ENCOUNTER — Other Ambulatory Visit: Payer: Self-pay | Admitting: Osteopathic Medicine

## 2018-07-23 ENCOUNTER — Other Ambulatory Visit: Payer: Self-pay | Admitting: Osteopathic Medicine

## 2018-07-23 NOTE — Telephone Encounter (Signed)
Please review for refill- patient at [facility] 

## 2018-08-03 ENCOUNTER — Other Ambulatory Visit: Payer: Self-pay | Admitting: Osteopathic Medicine

## 2018-08-03 DIAGNOSIS — E119 Type 2 diabetes mellitus without complications: Secondary | ICD-10-CM

## 2018-08-22 ENCOUNTER — Other Ambulatory Visit: Payer: Self-pay | Admitting: Osteopathic Medicine

## 2018-08-22 DIAGNOSIS — E785 Hyperlipidemia, unspecified: Secondary | ICD-10-CM

## 2018-08-22 DIAGNOSIS — E1169 Type 2 diabetes mellitus with other specified complication: Secondary | ICD-10-CM

## 2018-08-23 ENCOUNTER — Other Ambulatory Visit: Payer: Self-pay | Admitting: Osteopathic Medicine

## 2018-08-23 DIAGNOSIS — I10 Essential (primary) hypertension: Secondary | ICD-10-CM

## 2018-08-23 DIAGNOSIS — E119 Type 2 diabetes mellitus without complications: Secondary | ICD-10-CM

## 2018-08-23 NOTE — Telephone Encounter (Signed)
Please advise 

## 2018-09-10 ENCOUNTER — Encounter: Payer: Self-pay | Admitting: Family Medicine

## 2018-09-10 ENCOUNTER — Encounter: Payer: Self-pay | Admitting: Osteopathic Medicine

## 2018-09-13 ENCOUNTER — Other Ambulatory Visit: Payer: Self-pay

## 2018-09-13 ENCOUNTER — Ambulatory Visit (INDEPENDENT_AMBULATORY_CARE_PROVIDER_SITE_OTHER): Payer: Managed Care, Other (non HMO) | Admitting: Family Medicine

## 2018-09-13 ENCOUNTER — Encounter: Payer: Self-pay | Admitting: Family Medicine

## 2018-09-13 VITALS — BP 130/78 | HR 93 | Temp 98.1°F | Wt 212.0 lb

## 2018-09-13 DIAGNOSIS — M79672 Pain in left foot: Secondary | ICD-10-CM

## 2018-09-13 DIAGNOSIS — E1169 Type 2 diabetes mellitus with other specified complication: Secondary | ICD-10-CM | POA: Diagnosis not present

## 2018-09-13 DIAGNOSIS — M79671 Pain in right foot: Secondary | ICD-10-CM

## 2018-09-13 DIAGNOSIS — M25551 Pain in right hip: Secondary | ICD-10-CM | POA: Diagnosis not present

## 2018-09-13 DIAGNOSIS — B351 Tinea unguium: Secondary | ICD-10-CM

## 2018-09-13 MED ORDER — TERBINAFINE HCL 250 MG PO TABS: 250.0000 mg | ORAL_TABLET | Freq: Every day | ORAL | 0 refills | Status: DC

## 2018-09-13 NOTE — Patient Instructions (Addendum)
Thank you for coming in today. Call or go to the ER if you develop a large red swollen joint with extreme pain or oozing puss.  If not improving let me know.   Try using the hapad medium scaphoid pads in your shoes for your foot pain.  You can get more of them at [email or web address] or on [employer].   Take terbinafine daily for toenail fungus.  Follow up with Dr Sheppard Coil in 3 months for repeat labs and recheck toenail.   As for me recheck as needed for foot pain and hip pain.

## 2018-09-13 NOTE — Progress Notes (Signed)
Justin Frank is a 63 y.o. male who presents to [place] today for right hip pain.  Patient was seen for the same issue in March.  He was thought to have degenerative changes.  He was somewhat lost to follow-up due to COVID-19.  He notes the pain has persisted.  Pain is located at the anterior hip worse with activity better with rest.  He notes pain is also present with hip flexion activities.  He denies significant radiating pain weakness or numbness fevers or chills distally.  Additionally patient notes pain in his mid arch plantar feet bilaterally.  He does this is worse with prolonged standing.  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  He is tried getting different shoes which has not helped much.  Also he notes that he has thickened toenails.  He notes that are thickened and yellow and bothersome.  He finds them embarrassing and a little uncomfortable occasionally.  ROS:  As above  Exam:  BP 130/78   Pulse 93   Temp 98.1 F (36.7 C) (Oral)   Wt 212 lb (96.2 kg)   BMI 36.39 kg/m  Wt Readings from Last 5 Encounters:  09/13/18 212 lb (96.2 kg)  05/02/18 198 lb (89.8 kg)  04/27/18 199 lb 11.2 oz (90.6 kg)  01/17/18 199 lb (90.3 kg)  01/03/18 202 lb 8 oz (91.9 kg)   General: Well Developed, well nourished, and in no acute distress.  Neuro/Psych: Alert and oriented x3, extra-ocular muscles intact, able to move all 4 extremities, sensation grossly intact. Skin: Warm and dry, no rashes noted.  Respiratory: Not using accessory muscles, speaking in full sentences, trachea midline.  Cardiovascular: Pulses palpable, no extremity edema. Abdomen: Does not appear distended. MSK: Right hip normal-appearing not particularly tender decreased range of motion.  Pain with flexion and internal rotation.  Feet: Significant pes planus and ankle subluxation medially bilaterally.  Mildly tender palpation mid plantar arch.   Normal foot and ankle motion otherwise.  Pulses cap refill and sensation are intact.  Thickened toenails bilateral feet.  Yellowed appearing.  Consistent with onychomycosis.  Lab and Radiology Results EXAM: DG HIP (WITH OR WITHOUT PELVIS) 3V RIGHT  COMPARISON:  None.  FINDINGS: Pelvic ring is intact. No acute fracture or dislocation is noted. Mild degenerative changes of the hip joints are. No soft tissue abnormality is seen. No acute fracture noted.  IMPRESSION: Mild degenerative change without acute abnormality.   Electronically Signed   By: Inez Catalina M.D.   On: 04/28/2018 09:24  I personally (independently) visualized and performed the interpretation of the images attached in this note.   Procedure: Real-time Ultrasound Guided Injection of right hip femoral acetabular joint Device: GE Logiq E   Images permanently stored and available for review in the ultrasound unit. Verbal informed consent obtained.  Discussed risks and benefits of procedure. Warned about infection bleeding damage to structures skin hypopigmentation and fat atrophy among others. Patient expresses understanding and agreement Time-out conducted.   Noted no overlying erythema, induration, or other signs of local infection.   Skin prepped in a sterile fashion.   Local anesthesia: Topical Ethyl chloride.   With sterile technique and under real time ultrasound guidance:  80 mg of Depo-Medrol and 4 mL of Marcaine injected easily.   Completed without difficulty   Pain moderately resolved suggesting accurate placement of the medication.   Advised to call if fevers/chills, erythema, induration, drainage,  or persistent bleeding.   Images permanently stored and available for review in the ultrasound unit.  Impression: Technically successful ultrasound guided injection.       Assessment and Plan: 63 y.o. male with right hip pain.  Very likely degenerative changes.  Injected today.  We will see how  patient does following injection.  If significant improvement will proceed with watchful waiting.  Ultimately may need hip replacement if pain benefit from injection does not last very long.    Additionally patient has bilateral pain at the plantar feet at mid arch.  He has significant pes planus and ankle subluxation medially.  Plan for scaphoid pad trial.  Recheck if not improving.  Lastly he also has onychomycosis of his toenails bilaterally.  He notes this is bothersome.  Plan for trial of terbinafine.  Recheck with PCP in about 3 months for recheck labs and continuation of terbinafine therapy.  Discussed that it would likely take around 9 months to completely treat his onychomycosis.     PDMP not reviewed this encounter. No orders of the defined types were placed in this encounter.  No orders of the defined types were placed in this encounter.   Historical information moved to improve visibility of documentation.  Past Medical History:  Diagnosis Date  . Asthma   . Controlled type 2 diabetes mellitus without complication, without long-term current use of insulin ([place]) 03/22/2017  . ED (erectile dysfunction)   . Hyperlipidemia   . Hypertension    Past Surgical History:  Procedure Laterality Date  . HERNIA REPAIR     As per patient - 2012, 2013, 2015   Social History   Tobacco Use  . Smoking status: Former Smoker    Quit date: 03/02/1977    Years since quitting: 41.5  . Smokeless tobacco: Never Used  Substance Use Topics  . Alcohol use: No   family history includes Hypertension in his father and mother.  Medications: Current Outpatient Medications  Medication Sig Dispense Refill  . amLODipine (NORVASC) 10 MG tablet TAKE 1 TABLET(10 MG) BY MOUTH DAILY 90 tablet 3  . atorvastatin (LIPITOR) 10 MG tablet TAKE 1 TABLET(10 MG) BY MOUTH DAILY 90 tablet 0  . cetirizine (ZYRTEC) 10 MG tablet Take 1 tablet by mouth once daily 30 tablet 3  . fluticasone (FLONASE) 50 MCG/ACT nasal  spray SHAKE LIQUID AND USE 1 SPRAY IN EACH NOSTRIL EVERY DAY 16 g 11  . fluticasone furoate-vilanterol (BREO ELLIPTA) 100-25 MCG/INH AEPB INHALE 1 PUFF INTO THE LUNGS DAILY 28 each 11  . hydrOXYzine (ATARAX/VISTARIL) 25 MG tablet Take 1 tablet (25 mg total) by mouth every 6 (six) hours. 12 tablet 0  . metFORMIN (GLUCOPHAGE-XR) 500 MG 24 hr tablet TAKE 1 TABLET(500 MG) BY MOUTH DAILY WITH BREAKFAST 90 tablet 0  . valsartan (DIOVAN) 160 MG tablet TAKE 1 TABLET(160 MG) BY MOUTH DAILY 90 tablet 1  . albuterol (PROVENTIL HFA;VENTOLIN HFA) 108 (90 Base) MCG/ACT inhaler Inhale 2 puffs into the lungs every 6 (six) hours as needed for wheezing. 2 Inhaler 11  . cyclobenzaprine (FLEXERIL) 10 MG tablet Take 0.5-1 tablets (5-10 mg total) by mouth 3 (three) times daily as needed for muscle spasms. caution may cause sedation 30 tablet 0  . Liraglutide -Weight Management (SAXENDA) 18 MG/3ML SOPN Inject 3 mg into the skin daily. 9 pen 3  . naproxen (NAPROSYN) 500 MG tablet Take 1 tablet (500 mg total) by mouth 2 (two) times daily with a meal. Take daily for one week  then as needed after that for aches/pains 30 tablet 0   No current facility-administered medications for this visit.    Allergies  Allergen Reactions  . Atorvastatin Swelling    " lip swelling "    . Lisinopril Cough    Other reaction(s): Cough (ALLERGY/intolerance)      Discussed warning signs or symptoms. Please see discharge instructions. Patient expresses understanding.

## 2018-09-14 DIAGNOSIS — B351 Tinea unguium: Secondary | ICD-10-CM | POA: Insufficient documentation

## 2018-09-19 ENCOUNTER — Encounter: Payer: Self-pay | Admitting: Osteopathic Medicine

## 2018-09-19 ENCOUNTER — Other Ambulatory Visit: Payer: Self-pay

## 2018-09-19 ENCOUNTER — Ambulatory Visit (INDEPENDENT_AMBULATORY_CARE_PROVIDER_SITE_OTHER): Payer: Managed Care, Other (non HMO) | Admitting: Osteopathic Medicine

## 2018-09-19 VITALS — BP 139/83 | HR 96 | Temp 98.8°F | Wt 212.4 lb

## 2018-09-19 DIAGNOSIS — I10 Essential (primary) hypertension: Secondary | ICD-10-CM | POA: Diagnosis not present

## 2018-09-19 DIAGNOSIS — E119 Type 2 diabetes mellitus without complications: Secondary | ICD-10-CM

## 2018-09-19 LAB — POCT GLYCOSYLATED HEMOGLOBIN (HGB A1C): Hemoglobin A1C: 6.4 % — AB (ref 4.0–5.6)

## 2018-09-19 MED ORDER — LINACLOTIDE 145 MCG PO CAPS: 145.0000 ug | ORAL_CAPSULE | Freq: Every day | ORAL | 1 refills | Status: DC

## 2018-09-19 MED ORDER — OZEMPIC (0.25 OR 0.5 MG/DOSE) 2 MG/1.5ML ~~LOC~~ SOPN: 0.5000 mg | PEN_INJECTOR | SUBCUTANEOUS | 5 refills | Status: DC

## 2018-09-19 NOTE — Progress Notes (Signed)
HPI: Justin Frank is a 63 y.o. male who  has a past medical history of Asthma, Controlled type 2 diabetes mellitus without complication, without long-term current use of insulin ([place]) (03/22/2017), ED (erectile dysfunction), Hyperlipidemia, and Hypertension.  he presents to [facility] today, 09/19/18,  for chief complaint of:  DM2 follow-up   DIABETES SCREENING/PREVENTIVE CARE: A1C past 3-6 mos: Yes  controlled? Yes   09/19/18 today: 6.4% -has been off of the Cook for a while, would like to get back on the Ozempic BP goal <130/80: no but close, pt would like ot work on weight loss  LDL goal <70: No -We decided to increase atorvastatin this visit from 10 to 20 mg Eye exam annually: None on file, importance discussed with patient Foot exam: No  Microalbuminuria:n/a Metformin: Yes  ACE/ARB: Yes  Antiplatelet if ASCVD Risk >10%: No  Statin: Yes  Pneumovax: Yes   Immunization History  Administered Date(s) Administered  . Influenza, Seasonal, Injecte, Preservative Fre 03/15/2017  . Influenza,inj,Quad PF,6+ Mos 03/15/2017, 01/03/2018  . Pneumococcal Polysaccharide-23 06/14/2017  . Pneumococcal-Unspecified 06/14/2017  . Tdap 06/14/2017      At today's visit 09/19/18 ... PMH, PSH, FH reviewed and updated as needed.  Current medication list and allergy/intolerance hx reviewed and updated as needed. (See remainder of HPI, ROS, Phys Exam below)   No results found.  Results for orders placed or performed in visit on 09/19/18 (from the past 72 hour(s))  POCT HgB A1C     Status: Abnormal   Collection Time: 09/19/18  3:31 PM  Result Value Ref Range   Hemoglobin A1C 6.4 (A) 4.0 - 5.6 %   HbA1c POC (<> result, manual entry)     HbA1c, POC (prediabetic range)     HbA1c, POC (controlled diabetic range)    COMPLETE METABOLIC PANEL WITH GFR     Status: None   Collection Time: 09/19/18  4:20 PM  Result Value Ref Range   Glucose, Bld 104 65 -  139 mg/dL    Comment: .        Non-fasting reference interval .    BUN 19 7 - 25 mg/dL   Creat 1.01 0.70 - 1.25 mg/dL    Comment: For patients >69 years of age, the reference limit for Creatinine is approximately 13% higher for people identified as African-American. .    GFR, Est Non African American 80 > OR = 60 mL/min/1.51m2   GFR, Est African American 93 > OR = 60 mL/min/1.39m2   BUN/Creatinine Ratio NOT APPLICABLE 6 - 22 (calc)   Sodium 138 135 - 146 mmol/L   Potassium 3.7 3.5 - 5.3 mmol/L   Chloride 103 98 - 110 mmol/L   CO2 25 20 - 32 mmol/L   Calcium 9.4 8.6 - 10.3 mg/dL   Total Protein 7.1 6.1 - 8.1 g/dL   Albumin 4.5 3.6 - 5.1 g/dL   Globulin 2.6 1.9 - 3.7 g/dL (calc)   AG Ratio 1.7 1.0 - 2.5 (calc)   Total Bilirubin 0.5 0.2 - 1.2 mg/dL   Alkaline phosphatase (APISO) 81 35 - 144 U/L   AST 19 10 - 35 U/L   ALT 17 9 - 46 U/L  CBC     Status: Abnormal   Collection Time: 09/19/18  4:20 PM  Result Value Ref Range   WBC 8.4 3.8 - 10.8 Thousand/uL   RBC 5.95 (H) 4.20 - 5.80 Million/uL   Hemoglobin 13.1 (L) 13.2 - 17.1 g/dL  HCT 41.4 38.5 - 50.0 %   MCV 69.6 (L) 80.0 - 100.0 fL   MCH 22.0 (L) 27.0 - 33.0 pg   MCHC 31.6 (L) 32.0 - 36.0 g/dL   RDW 15.5 (H) 11.0 - 15.0 %   Platelets 284 140 - 400 Thousand/uL   MPV 9.7 7.5 - 12.5 fL  Lipid panel     Status: Abnormal   Collection Time: 09/19/18  4:20 PM  Result Value Ref Range   Cholesterol 171 <200 mg/dL   HDL 53 > OR = 40 mg/dL   Triglycerides 90 <150 mg/dL   LDL Cholesterol (Calc) 100 (H) mg/dL (calc)    Comment: Reference range: <100 . Desirable range <100 mg/dL for primary prevention;   <70 mg/dL for patients with CHD or diabetic patients  with > or = 2 CHD risk factors. Marland Kitchen LDL-C is now calculated using the Martin-Hopkins  calculation, which is a validated novel method providing  better accuracy than the Friedewald equation in the  estimation of LDL-C.  Cresenciano Genre et al. Annamaria Helling. 9563;875(64): 2061-2068   (http://education.[employer].com/faq/FAQ164)    Total CHOL/HDL Ratio 3.2 <5.0 (calc)   Non-HDL Cholesterol (Calc) 118 <130 mg/dL (calc)    Comment: For patients with diabetes plus 1 major ASCVD risk  factor, treating to a non-HDL-C goal of <100 mg/dL  (LDL-C of <70 mg/dL) is considered a therapeutic  option.           ASSESSMENT/PLAN: The primary encounter diagnosis was Controlled type 2 diabetes mellitus without complication, without long-term current use of insulin ([place]). A diagnosis of Essential hypertension was also pertinent to this visit.   Orders Placed This Encounter  Procedures  . POCT HgB A1C     Meds ordered this encounter  Medications  . linaclotide (LINZESS) 145 MCG CAPS capsule    Sig: Take 1 capsule (145 mcg total) by mouth daily before breakfast.    Dispense:  90 capsule    Refill:  1  . Semaglutide,0.25 or 0.5MG /DOS, (OZEMPIC, 0.25 OR 0.5 MG/DOSE,) 2 MG/1.5ML SOPN    Sig: Inject 0.5 mg into the skin once a week.    Dispense:  4 pen    Refill:  5       Follow-up plan: Return in about 3 months (around 12/20/2018) for recheck A1C. .                                                 ################################################# ################################################# ################################################# #################################################    Current Meds  Medication Sig  . amLODipine (NORVASC) 10 MG tablet TAKE 1 TABLET(10 MG) BY MOUTH DAILY  . atorvastatin (LIPITOR) 10 MG tablet TAKE 1 TABLET(10 MG) BY MOUTH DAILY  . cetirizine (ZYRTEC) 10 MG tablet Take 1 tablet by mouth once daily  . fluticasone (FLONASE) 50 MCG/ACT nasal spray SHAKE LIQUID AND USE 1 SPRAY IN EACH NOSTRIL EVERY DAY  . fluticasone furoate-vilanterol (BREO ELLIPTA) 100-25 MCG/INH AEPB INHALE 1 PUFF INTO THE LUNGS DAILY  . hydrOXYzine (ATARAX/VISTARIL) 25 MG tablet Take 1 tablet (25 mg  total) by mouth every 6 (six) hours.  . metFORMIN (GLUCOPHAGE-XR) 500 MG 24 hr tablet TAKE 1 TABLET(500 MG) BY MOUTH DAILY WITH BREAKFAST  . terbinafine (LAMISIL) 250 MG tablet Take 1 tablet (250 mg total) by mouth daily.  . valsartan (DIOVAN) 160 MG tablet TAKE 1 TABLET(160 MG) BY MOUTH  DAILY    Allergies  Allergen Reactions  . Atorvastatin Swelling    " lip swelling "    . Lisinopril Cough    Other reaction(s): Cough (ALLERGY/intolerance)       Review of Systems:  Constitutional: No recent illness  HEENT: No  headache, no vision change  Cardiac: No  chest pain, No  pressure, No palpitations  Respiratory:  No  shortness of breath. No  Cough  Gastrointestinal: No  abdominal pain, no change on bowel habits  Musculoskeletal: No new myalgia/arthralgia  Skin: No  Rash  Psychiatric: No  concerns with depression, No  concerns with anxiety  Exam:  BP 139/83 (BP Location: Left Arm, Patient Position: Sitting, Cuff Size: Large)   Pulse 96   Temp 98.8 F (37.1 C) (Oral)   Wt 212 lb 6.4 oz (96.3 kg)   BMI 36.46 kg/m   Constitutional: VS see above. General Appearance: alert, well-developed, well-nourished, NAD  Eyes: Normal lids and conjunctive, non-icteric sclera  Neck: No masses, trachea midline.   Respiratory: Normal respiratory effort. no wheeze, no rhonchi, no rales  Cardiovascular: S1/S2 normal, no murmur, no rub/gallop auscultated. RRR.   Musculoskeletal: Gait normal. Symmetric and independent movement of all extremities  Neurological: Normal balance/coordination. No tremor.  Skin: warm, dry, intact.   Psychiatric: Normal judgment/insight. Normal mood and affect. Oriented x3.       Visit summary with medication list and pertinent instructions was printed for patient to review, patient was advised to alert [place] if any updates are needed. All questions at time of visit were answered - patient instructed to contact office with any additional concerns. ER/RTC  precautions were reviewed with the patient and understanding verbalized.   Note: Total time spent 25 minutes, greater than 50% of the visit was spent face-to-face counseling and coordinating care for the following: The primary encounter diagnosis was Controlled type 2 diabetes mellitus without complication, without long-term current use of insulin ([place]). A diagnosis of Essential hypertension was also pertinent to this visit.Marland Kitchen  Please note: voice recognition software was used to produce this document, and typos may escape review. Please contact Dr. Sheppard Coil for any needed clarifications.    Follow up plan: Return in about 3 months (around 12/20/2018) for recheck A1C. Marland Kitchen

## 2018-09-20 ENCOUNTER — Encounter: Payer: Self-pay | Admitting: Osteopathic Medicine

## 2018-09-20 ENCOUNTER — Other Ambulatory Visit: Payer: Self-pay | Admitting: Osteopathic Medicine

## 2018-09-20 DIAGNOSIS — E785 Hyperlipidemia, unspecified: Secondary | ICD-10-CM

## 2018-09-20 DIAGNOSIS — E1169 Type 2 diabetes mellitus with other specified complication: Secondary | ICD-10-CM

## 2018-09-20 LAB — CBC
HCT: 41.4 % (ref 38.5–50.0)
Hemoglobin: 13.1 g/dL — ABNORMAL LOW (ref 13.2–17.1)
MCH: 22 pg — ABNORMAL LOW (ref 27.0–33.0)
MCHC: 31.6 g/dL — ABNORMAL LOW (ref 32.0–36.0)
MCV: 69.6 fL — ABNORMAL LOW (ref 80.0–100.0)
MPV: 9.7 fL (ref 7.5–12.5)
Platelets: 284 10*3/uL (ref 140–400)
RBC: 5.95 10*6/uL — ABNORMAL HIGH (ref 4.20–5.80)
RDW: 15.5 % — ABNORMAL HIGH (ref 11.0–15.0)
WBC: 8.4 10*3/uL (ref 3.8–10.8)

## 2018-09-20 LAB — COMPLETE METABOLIC PANEL WITH GFR
AG Ratio: 1.7 (calc) (ref 1.0–2.5)
ALT: 17 U/L (ref 9–46)
AST: 19 U/L (ref 10–35)
Albumin: 4.5 g/dL (ref 3.6–5.1)
Alkaline phosphatase (APISO): 81 U/L (ref 35–144)
BUN: 19 mg/dL (ref 7–25)
CO2: 25 mmol/L (ref 20–32)
Calcium: 9.4 mg/dL (ref 8.6–10.3)
Chloride: 103 mmol/L (ref 98–110)
Creat: 1.01 mg/dL (ref 0.70–1.25)
GFR, Est African American: 93 mL/min/{1.73_m2} (ref >=60)
GFR, Est Non African American: 80 mL/min/{1.73_m2} (ref >=60)
Globulin: 2.6 g/dL (calc) (ref 1.9–3.7)
Glucose, Bld: 104 mg/dL (ref 65–139)
Potassium: 3.7 mmol/L (ref 3.5–5.3)
Sodium: 138 mmol/L (ref 135–146)
Total Bilirubin: 0.5 mg/dL (ref 0.2–1.2)
Total Protein: 7.1 g/dL (ref 6.1–8.1)

## 2018-09-20 LAB — LIPID PANEL
Cholesterol: 171 mg/dL (ref <200)
HDL: 53 mg/dL (ref >=40)
LDL Cholesterol (Calc): 100 mg/dL (calc) — ABNORMAL HIGH
Non-HDL Cholesterol (Calc): 118 mg/dL (calc) (ref <130)
Total CHOL/HDL Ratio: 3.2 (calc) (ref <5.0)
Triglycerides: 90 mg/dL (ref <150)

## 2018-09-20 MED ORDER — ATORVASTATIN CALCIUM 20 MG PO TABS: 20.0000 mg | ORAL_TABLET | Freq: Every day | ORAL | 3 refills | Status: DC

## 2018-10-03 ENCOUNTER — Encounter: Payer: Self-pay | Admitting: Family Medicine

## 2018-10-06 ENCOUNTER — Ambulatory Visit (INDEPENDENT_AMBULATORY_CARE_PROVIDER_SITE_OTHER): Payer: Managed Care, Other (non HMO) | Admitting: Family Medicine

## 2018-10-06 ENCOUNTER — Encounter: Payer: Self-pay | Admitting: Family Medicine

## 2018-10-06 ENCOUNTER — Other Ambulatory Visit: Payer: Self-pay

## 2018-10-06 VITALS — BP 138/81 | HR 83 | Temp 98.5°F | Wt 208.0 lb

## 2018-10-06 DIAGNOSIS — M25552 Pain in left hip: Secondary | ICD-10-CM

## 2018-10-06 NOTE — Patient Instructions (Signed)
Thank you for coming in today. Call or go to the ER if you develop a large red swollen joint with extreme pain or oozing puss.  Recheck as needed  Take it easy for a day or so.  Let me know if the pain returns.

## 2018-10-06 NOTE — Progress Notes (Signed)
Justin Frank is a 63 y.o. male who presents to [facility] today for left hip pain.  Patient was seen previously for right hip pain found to be due to mild to moderate DJD.  He had excellent results with right hip femoral acetabular injection about 3 weeks ago.  Since then he has noted that his mild to moderate left hip pain has become more pronounced.  He thinks is because his right hip is not hurting as much and is able to pay more attention to his left hip.  He like to proceed with left hip injection if able.    ROS:  As above  Exam:  BP 138/81   Pulse 83   Temp 98.5 F (36.9 C) (Oral)   Wt 208 lb (94.3 kg)   BMI 35.70 kg/m  Wt Readings from Last 5 Encounters:  10/06/18 208 lb (94.3 kg)  09/19/18 212 lb 6.4 oz (96.3 kg)  09/13/18 212 lb (96.2 kg)  05/02/18 198 lb (89.8 kg)  04/27/18 199 lb 11.2 oz (90.6 kg)   General: Well Developed, well nourished, and in no acute distress.  Neuro/Psych: Alert and oriented x3, extra-ocular muscles intact, able to move all 4 extremities, sensation grossly intact. Skin: Warm and dry, no rashes noted.  Respiratory: Not using accessory muscles, speaking in full sentences, trachea midline.  Cardiovascular: Pulses palpable, no extremity edema. Abdomen: Does not appear distended. MSK: Left hip: Normal-appearing normal motion pain with internal rotation and flexion.     Lab and Radiology Results X-ray images hip obtained April 28, 2018 personally independently reviewed.  Left hip does show mild degenerative changes without acute fractures.  Procedure: Real-time Ultrasound Guided Injection of left hip Device: GE Logiq E   Images permanently stored and available for review in the ultrasound unit. Verbal informed consent obtained.  Discussed risks and benefits of procedure. Warned about infection bleeding damage to structures skin hypopigmentation and fat atrophy among others. Patient  expresses understanding and agreement Time-out conducted.   Noted no overlying erythema, induration, or other signs of local infection.   Skin prepped in a sterile fashion.   Local anesthesia: Topical Ethyl chloride.   With sterile technique and under real time ultrasound guidance:  80 mg of Depo-Medrol and 4 mL of Marcaine injected easily.   Completed without difficulty   Pain immediately resolved suggesting accurate placement of the medication.   Advised to call if fevers/chills, erythema, induration, drainage, or persistent bleeding.   Images permanently stored and available for review in the ultrasound unit.  Impression: Technically successful ultrasound guided injection.         Assessment and Plan: 63 y.o. male with left hip pain due to degenerative changes seen on x-ray dated February 2020.  Patient had good results with right hip injection.  Proceeded with left hip injection today.  Recheck as needed.  Continue activity as tolerated and typical conservative management measures including Tylenol ibuprofen etc.   PDMP not reviewed this encounter. No orders of the defined types were placed in this encounter.  No orders of the defined types were placed in this encounter.   Historical information moved to improve visibility of documentation.  Past Medical History:  Diagnosis Date  . Asthma   . Controlled type 2 diabetes mellitus without complication, without long-term current use of insulin ([place]) 03/22/2017  . ED (erectile dysfunction)   . Hyperlipidemia   . Hypertension    Past Surgical History:  Procedure Laterality Date  .  HERNIA REPAIR     As per patient - 2012, 2013, 2015   Social History   Tobacco Use  . Smoking status: Former Smoker    Quit date: 03/02/1977    Years since quitting: 41.6  . Smokeless tobacco: Never Used  Substance Use Topics  . Alcohol use: No   family history includes Hypertension in his father and mother.  Medications: Current  Outpatient Medications  Medication Sig Dispense Refill  . amLODipine (NORVASC) 10 MG tablet TAKE 1 TABLET(10 MG) BY MOUTH DAILY 90 tablet 3  . atorvastatin (LIPITOR) 20 MG tablet Take 1 tablet (20 mg total) by mouth daily. 90 tablet 3  . cetirizine (ZYRTEC) 10 MG tablet Take 1 tablet by mouth once daily 30 tablet 3  . fluticasone (FLONASE) 50 MCG/ACT nasal spray SHAKE LIQUID AND USE 1 SPRAY IN EACH NOSTRIL EVERY DAY 16 g 11  . hydrOXYzine (ATARAX/VISTARIL) 25 MG tablet Take 1 tablet (25 mg total) by mouth every 6 (six) hours. 12 tablet 0  . linaclotide (LINZESS) 145 MCG CAPS capsule Take 1 capsule (145 mcg total) by mouth daily before breakfast. 90 capsule 1  . metFORMIN (GLUCOPHAGE-XR) 500 MG 24 hr tablet TAKE 1 TABLET(500 MG) BY MOUTH DAILY WITH BREAKFAST 90 tablet 0  . Semaglutide,0.25 or 0.5MG /DOS, (OZEMPIC, 0.25 OR 0.5 MG/DOSE,) 2 MG/1.5ML SOPN Inject 0.5 mg into the skin once a week. 4 pen 5  . terbinafine (LAMISIL) 250 MG tablet Take 1 tablet (250 mg total) by mouth daily. 90 tablet 0  . valsartan (DIOVAN) 160 MG tablet TAKE 1 TABLET(160 MG) BY MOUTH DAILY 90 tablet 1  . fluticasone furoate-vilanterol (BREO ELLIPTA) 100-25 MCG/INH AEPB INHALE 1 PUFF INTO THE LUNGS DAILY 28 each 11   No current facility-administered medications for this visit.    Allergies  Allergen Reactions  . Atorvastatin Swelling    " lip swelling "    . Lisinopril Cough    Other reaction(s): Cough (ALLERGY/intolerance)      Discussed warning signs or symptoms. Please see discharge instructions. Patient expresses understanding.

## 2018-10-31 ENCOUNTER — Other Ambulatory Visit: Payer: Self-pay | Admitting: Osteopathic Medicine

## 2018-10-31 DIAGNOSIS — E119 Type 2 diabetes mellitus without complications: Secondary | ICD-10-CM

## 2018-11-18 ENCOUNTER — Other Ambulatory Visit: Payer: Self-pay | Admitting: Osteopathic Medicine

## 2018-11-18 DIAGNOSIS — E1169 Type 2 diabetes mellitus with other specified complication: Secondary | ICD-10-CM

## 2018-11-18 NOTE — Telephone Encounter (Signed)
Forwarding medication refill request to PCP for review. 

## 2018-12-13 ENCOUNTER — Encounter: Payer: Self-pay | Admitting: Osteopathic Medicine

## 2018-12-13 MED ORDER — CETIRIZINE HCL 10 MG PO TABS: 10.0000 mg | ORAL_TABLET | Freq: Every day | ORAL | 3 refills | Status: DC

## 2018-12-21 ENCOUNTER — Encounter: Payer: Self-pay | Admitting: Osteopathic Medicine

## 2018-12-21 ENCOUNTER — Other Ambulatory Visit: Payer: Self-pay

## 2018-12-21 ENCOUNTER — Ambulatory Visit (INDEPENDENT_AMBULATORY_CARE_PROVIDER_SITE_OTHER): Payer: Managed Care, Other (non HMO) | Admitting: Osteopathic Medicine

## 2018-12-21 VITALS — BP 131/82 | HR 76 | Temp 98.3°F | Wt 195.0 lb

## 2018-12-21 DIAGNOSIS — G5712 Meralgia paresthetica, left lower limb: Secondary | ICD-10-CM | POA: Diagnosis not present

## 2018-12-21 DIAGNOSIS — Z23 Encounter for immunization: Secondary | ICD-10-CM

## 2018-12-21 DIAGNOSIS — I10 Essential (primary) hypertension: Secondary | ICD-10-CM | POA: Diagnosis not present

## 2018-12-21 DIAGNOSIS — E119 Type 2 diabetes mellitus without complications: Secondary | ICD-10-CM

## 2018-12-21 LAB — POCT GLYCOSYLATED HEMOGLOBIN (HGB A1C): Hemoglobin A1C: 6.1 % — AB (ref 4.0–5.6)

## 2018-12-21 MED ORDER — CETIRIZINE HCL 10 MG PO TABS: 10.0000 mg | ORAL_TABLET | Freq: Every day | ORAL | 3 refills | Status: DC

## 2018-12-21 MED ORDER — OZEMPIC (0.25 OR 0.5 MG/DOSE) 2 MG/1.5ML ~~LOC~~ SOPN: 0.5000 mg | PEN_INJECTOR | SUBCUTANEOUS | 5 refills | Status: DC

## 2018-12-21 MED ORDER — METFORMIN HCL ER 500 MG PO TB24: ORAL_TABLET | ORAL | 3 refills | Status: DC

## 2018-12-21 MED ORDER — GABAPENTIN 100 MG PO CAPS: 100.0000 mg | ORAL_CAPSULE | Freq: Three times a day (TID) | ORAL | 1 refills | Status: DC

## 2018-12-21 MED ORDER — HYDROXYZINE HCL 25 MG PO TABS: 25.0000 mg | ORAL_TABLET | Freq: Four times a day (QID) | ORAL | 0 refills | Status: DC

## 2018-12-21 MED ORDER — VALSARTAN 160 MG PO TABS: ORAL_TABLET | ORAL | 3 refills | Status: DC

## 2018-12-21 MED ORDER — TERBINAFINE HCL 250 MG PO TABS: 250.0000 mg | ORAL_TABLET | Freq: Every day | ORAL | 0 refills | Status: AC

## 2018-12-21 MED ORDER — HYDROXYZINE HCL 25 MG PO TABS: 25.0000 mg | ORAL_TABLET | Freq: Four times a day (QID) | ORAL | 0 refills | Status: DC | PRN

## 2018-12-21 NOTE — Progress Notes (Signed)
HPI: Justin Frank is a 63 y.o. male who  has a past medical history of Asthma, Controlled type 2 diabetes mellitus without complication, without long-term current use of insulin ([place]) (03/22/2017), ED (erectile dysfunction), Hyperlipidemia, and Hypertension.  he presents to [facility] today, 12/21/18,  for chief complaint of:  DM2 HTN Leg problem   Doing really well overall! No major problems lately. Has been successful w/ intentional weight loss. He's managing well w/ current medications.   Requests refill for tinea pedis.   Leg issue:L leg, antero-lateral thigh reports numbness and burning from above knee to lateral hip.  no injury or fall, no low back pain. Ongoing about a month.     At today's visit 12/21/18 ... PMH, PSH, FH reviewed and updated as needed.  Current medication list and allergy/intolerance hx reviewed and updated as needed. (See remainder of HPI, ROS, Phys Exam below)   No results found.  Results for orders placed or performed in visit on 12/21/18 (from the past 72 hour(s))  POCT HgB A1C     Status: Abnormal   Collection Time: 12/21/18  4:17 PM  Result Value Ref Range   Hemoglobin A1C 6.1 (A) 4.0 - 5.6 %   HbA1c POC (<> result, manual entry)     HbA1c, POC (prediabetic range)     HbA1c, POC (controlled diabetic range)            ASSESSMENT/PLAN: The primary encounter diagnosis was Controlled type 2 diabetes mellitus without complication, without long-term current use of insulin ([place]). Diagnoses of Need for influenza vaccination, Essential hypertension, and Meralgia paresthetica of left side were also pertinent to this visit.  Symptoms seem to correspond to meralgia paresthetica, will trial gabapentin for relief, consider PT referral or sports med referral prior to involving neuro.   Other chronic conditions under pretty good control  Orders Placed This Encounter  Procedures  . Flu Vaccine QUAD 6+ mos PF IM  (Fluarix Quad PF)  . CBC  . COMPLETE METABOLIC PANEL WITH GFR  . Lipid panel  . POCT HgB A1C     Meds ordered this encounter  Medications  . cetirizine (ZYRTEC) 10 MG tablet    Sig: Take 1 tablet (10 mg total) by mouth daily.    Dispense:  30 tablet    Refill:  3  . DISCONTD: hydrOXYzine (ATARAX/VISTARIL) 25 MG tablet    Sig: Take 1 tablet (25 mg total) by mouth every 6 (six) hours.    Dispense:  12 tablet    Refill:  0  . Semaglutide,0.25 or 0.5MG /DOS, (OZEMPIC, 0.25 OR 0.5 MG/DOSE,) 2 MG/1.5ML SOPN    Sig: Inject 0.5 mg into the skin once a week.    Dispense:  4 pen    Refill:  5  . metFORMIN (GLUCOPHAGE-XR) 500 MG 24 hr tablet    Sig: TAKE 1 TABLET(500 MG) BY MOUTH DAILY WITH BREAKFAST    Dispense:  90 tablet    Refill:  3  . valsartan (DIOVAN) 160 MG tablet    Sig: TAKE 1 TABLET(160 MG) BY MOUTH DAILY    Dispense:  90 tablet    Refill:  3  . gabapentin (NEURONTIN) 100 MG capsule    Sig: Take 1-3 capsules (100-300 mg total) by mouth 3 (three) times daily. As needed for nerve pain    Dispense:  180 capsule    Refill:  1  . terbinafine (LAMISIL) 250 MG tablet    Sig: Take 1 tablet (  250 mg total) by mouth daily.    Dispense:  90 tablet    Refill:  0  . hydrOXYzine (ATARAX/VISTARIL) 25 MG tablet    Sig: Take 1 tablet (25 mg total) by mouth every 6 (six) hours as needed for anxiety (sleep).    Dispense:  12 tablet    Refill:  0        Follow-up plan: Return for Virtual visit in 1 month, recheck leg pain -call or message [place] sooner if needed.                                                 ################################################# ################################################# ################################################# #################################################    No outpatient medications have been marked as taking for the 12/21/18 encounter (Appointment) with Emeterio Reeve, DO.     Allergies  Allergen Reactions  . Atorvastatin Swelling    " lip swelling "    . Lisinopril Cough    Other reaction(s): Cough (ALLERGY/intolerance)       Review of Systems:  Constitutional: No recent illness  HEENT: No  headache, no vision change  Cardiac: No  chest pain, No  pressure, No palpitations  Respiratory:  No  shortness of breath. No  Cough  Gastrointestinal: No  abdominal pain, no change on bowel habits  Musculoskeletal: +new myalgia/arthralgia  Skin: No  Rash  Neurologic: No  weakness, No  Dizziness  Psychiatric: No  concerns with depression, No  concerns with anxiety  Exam:  BP 131/82 (BP Location: Left Arm, Patient Position: Sitting, Cuff Size: Large)   Pulse 76   Temp 98.3 F (36.8 C) (Oral)   Wt 195 lb (88.5 kg)   BMI 33.47 kg/m   Constitutional: VS see above. General Appearance: alert, well-developed, well-nourished, NAD  Eyes: Normal lids and conjunctive, non-icteric sclera  Ears, Nose, Mouth, Throat: MMM, Normal external inspection ears/nares/mouth/lips/gums.  Neck: No masses, trachea midline.   Respiratory: Normal respiratory effort. no wheeze, no rhonchi, no rales  Cardiovascular: S1/S2 normal, no murmur, no rub/gallop auscultated. RRR.   Musculoskeletal: Gait normal. Symmetric and independent movement of all extremities. Normal strength to hip flex/ext, knee flex/ext, no midline spinal tenderness,, normal sensation L and R thigh symmetrically. Neg SLR bilaterally.   Neurological: Normal balance/coordination. No tremor.  Skin: warm, dry, intact.   Psychiatric: Normal judgment/insight. Normal mood and affect. Oriented x3.       Visit summary with medication list and pertinent instructions was printed for patient to review, patient was advised to alert [place] if any updates are needed. All questions at time of visit were answered - patient instructed to contact office with any additional concerns. ER/RTC precautions were reviewed  with the patient and understanding verbalized.   Note: Total time spent 25 minutes, greater than 50% of the visit was spent face-to-face counseling and coordinating care for the following: The primary encounter diagnosis was Controlled type 2 diabetes mellitus without complication, without long-term current use of insulin ([place]). Diagnoses of Need for influenza vaccination, Essential hypertension, and Meralgia paresthetica of left side were also pertinent to this visit.Marland Kitchen  Please note: voice recognition software was used to produce this document, and typos may escape review. Please contact Dr. Sheppard Coil for any needed clarifications.    Follow up plan: Return for Virtual visit in 1 month, recheck leg pain -call or message [place] sooner if needed.

## 2018-12-21 NOTE — Patient Instructions (Signed)
Meralgia Paresthetica / Lateral Femoral Cutaneous Nerve impingement

## 2018-12-22 LAB — COMPLETE METABOLIC PANEL WITH GFR
AG Ratio: 1.6 (calc) (ref 1.0–2.5)
ALT: 17 U/L (ref 9–46)
AST: 19 U/L (ref 10–35)
Albumin: 4.4 g/dL (ref 3.6–5.1)
Alkaline phosphatase (APISO): 85 U/L (ref 35–144)
BUN: 16 mg/dL (ref 7–25)
CO2: 27 mmol/L (ref 20–32)
Calcium: 9.5 mg/dL (ref 8.6–10.3)
Chloride: 102 mmol/L (ref 98–110)
Creat: 1 mg/dL (ref 0.70–1.25)
GFR, Est African American: 94 mL/min/{1.73_m2} (ref >=60)
GFR, Est Non African American: 81 mL/min/{1.73_m2} (ref >=60)
Globulin: 2.7 g/dL (calc) (ref 1.9–3.7)
Glucose, Bld: 79 mg/dL (ref 65–99)
Potassium: 4 mmol/L (ref 3.5–5.3)
Sodium: 139 mmol/L (ref 135–146)
Total Bilirubin: 0.5 mg/dL (ref 0.2–1.2)
Total Protein: 7.1 g/dL (ref 6.1–8.1)

## 2018-12-22 LAB — CBC
HCT: 40.2 % (ref 38.5–50.0)
Hemoglobin: 12.7 g/dL — ABNORMAL LOW (ref 13.2–17.1)
MCH: 22.2 pg — ABNORMAL LOW (ref 27.0–33.0)
MCHC: 31.6 g/dL — ABNORMAL LOW (ref 32.0–36.0)
MCV: 70.4 fL — ABNORMAL LOW (ref 80.0–100.0)
MPV: 9.9 fL (ref 7.5–12.5)
Platelets: 286 10*3/uL (ref 140–400)
RBC: 5.71 10*6/uL (ref 4.20–5.80)
RDW: 15.9 % — ABNORMAL HIGH (ref 11.0–15.0)
WBC: 8.4 10*3/uL (ref 3.8–10.8)

## 2018-12-22 LAB — LIPID PANEL
Cholesterol: 145 mg/dL (ref <200)
HDL: 62 mg/dL (ref >=40)
LDL Cholesterol (Calc): 70 mg/dL (calc)
Non-HDL Cholesterol (Calc): 83 mg/dL (calc) (ref <130)
Total CHOL/HDL Ratio: 2.3 (calc) (ref <5.0)
Triglycerides: 54 mg/dL (ref <150)

## 2018-12-27 ENCOUNTER — Ambulatory Visit: Payer: Managed Care, Other (non HMO) | Admitting: Osteopathic Medicine

## 2019-01-04 ENCOUNTER — Encounter: Payer: Self-pay | Admitting: Osteopathic Medicine

## 2019-01-19 ENCOUNTER — Other Ambulatory Visit: Payer: Self-pay | Admitting: Osteopathic Medicine

## 2019-01-19 ENCOUNTER — Encounter: Payer: Self-pay | Admitting: Osteopathic Medicine

## 2019-01-19 MED ORDER — GABAPENTIN 400 MG PO CAPS: 400.0000 mg | ORAL_CAPSULE | Freq: Three times a day (TID) | ORAL | 1 refills | Status: DC

## 2019-01-30 ENCOUNTER — Ambulatory Visit (INDEPENDENT_AMBULATORY_CARE_PROVIDER_SITE_OTHER): Payer: Managed Care, Other (non HMO) | Admitting: Osteopathic Medicine

## 2019-01-30 ENCOUNTER — Encounter: Payer: Self-pay | Admitting: Osteopathic Medicine

## 2019-01-30 DIAGNOSIS — G5712 Meralgia paresthetica, left lower limb: Secondary | ICD-10-CM | POA: Diagnosis not present

## 2019-01-30 MED ORDER — VALACYCLOVIR HCL 1 G PO TABS: 1000.0000 mg | ORAL_TABLET | Freq: Three times a day (TID) | ORAL | 0 refills | Status: AC

## 2019-01-30 MED ORDER — PREGABALIN 150 MG PO CAPS: 150.0000 mg | ORAL_CAPSULE | Freq: Two times a day (BID) | ORAL | 1 refills | Status: DC

## 2019-01-30 NOTE — Progress Notes (Signed)
Virtual Visit via Phone   I connected with      Justin Frank on 01/30/19 at@ by a telemedicine application and verified that I am speaking with the correct person using two identifiers.  Patient is at home  I am in office   I discussed the limitations of evaluation and management by telemedicine and the availability of in person appointments. The patient expressed understanding and agreed to proceed.  History of Present Illness: Justin Frank is a 63 y.o. male who would like to discuss left leg burning pain     Last seen in office 12/21/18 Leg issue: L leg, antero-lateral thigh reports numbness and burning from above knee to lateral hip.  no injury or fall, no low back pain. Ongoing about a month. I was suspicious of meralgia paresthetica and we started Gabapentin at 100 mg tid to titrate up to 300 mg tid if needed. At this point he is on Gabapentin 400 mg tid. Today feels about the same, same type of pain. When he sits it doesn't bother him. With standing, it feels like sudden excruciating burning pain. No rash or redness. Reports tender spot on the R hip as well but in the back.    Immunization History  Administered Date(s) Administered  . Influenza, Seasonal, Injecte, Preservative Fre 03/15/2017  . Influenza,inj,Quad PF,6+ Mos 03/15/2017, 01/03/2018, 12/21/2018  . Pneumococcal Polysaccharide-23 06/14/2017  . Pneumococcal-Unspecified 06/14/2017  . Tdap 06/14/2017      Observations/Objective: There were no vitals taken for this visit. BP Readings from Last 3 Encounters:  12/21/18 131/82  10/06/18 138/81  09/19/18 139/83   Exam: Normal Speech.  NAD  Lab and Radiology Results No results found for this or any previous visit (from the past 72 hour(s)). No results found.     Assessment and Plan: 63 y.o. male with The encounter diagnosis was Meralgia paresthetica of left side.  Shingles seems unlikely but level of pain at this point I think antivirals are  worth a shot. Will trial Lyrica, stop Gabapentin. Follow up with sorts med to eval possible comorbid hip.lower back pathology, see if we might need to pursue imaging.   PDMP not reviewed this encounter. No orders of the defined types were placed in this encounter.  Meds ordered this encounter  Medications  . pregabalin (LYRICA) 150 MG capsule    Sig: Take 1 capsule (150 mg total) by mouth 2 (two) times daily.    Dispense:  60 capsule    Refill:  1  . valACYclovir (VALTREX) 1000 MG tablet    Sig: Take 1 tablet (1,000 mg total) by mouth 3 (three) times daily for 7 days.    Dispense:  21 tablet    Refill:  0      Follow Up Instructions: Return for VISIT WITH SPORTS MEDICINE .    I discussed the assessment and treatment plan with the patient. The patient was provided an opportunity to ask questions and all were answered. The patient agreed with the plan and demonstrated an understanding of the instructions.   The patient was advised to call back or seek an in-person evaluation if any new concerns, if symptoms worsen or if the condition fails to improve as anticipated.  21 minutes of non-face-to-face time was provided during this encounter.      . . . . . . . . . . . . . Marland Kitchen  Historical information moved to improve visibility of documentation.  Past Medical History:  Diagnosis Date  . Asthma   . Controlled type 2 diabetes mellitus without complication, without long-term current use of insulin ([place]) 03/22/2017  . ED (erectile dysfunction)   . Hyperlipidemia   . Hypertension    Past Surgical History:  Procedure Laterality Date  . HERNIA REPAIR     As per patient - 2012, 2013, 2015   Social History   Tobacco Use  . Smoking status: Former Smoker    Quit date: 03/02/1977    Years since quitting: 41.9  . Smokeless tobacco: Never Used  Substance Use Topics  . Alcohol use: No   family history includes Hypertension in his father  and mother.  Medications: Current Outpatient Medications  Medication Sig Dispense Refill  . amLODipine (NORVASC) 10 MG tablet TAKE 1 TABLET(10 MG) BY MOUTH DAILY 90 tablet 3  . atorvastatin (LIPITOR) 20 MG tablet Take 1 tablet (20 mg total) by mouth daily. 90 tablet 3  . cetirizine (ZYRTEC) 10 MG tablet Take 1 tablet (10 mg total) by mouth daily. 30 tablet 3  . fluticasone (FLONASE) 50 MCG/ACT nasal spray SHAKE LIQUID AND USE 1 SPRAY IN EACH NOSTRIL EVERY DAY 16 g 11  . gabapentin (NEURONTIN) 400 MG capsule Take 1 capsule (400 mg total) by mouth 3 (three) times daily. As needed for nerve pain 90 capsule 1  . hydrOXYzine (ATARAX/VISTARIL) 25 MG tablet Take 1 tablet (25 mg total) by mouth every 6 (six) hours as needed for anxiety (sleep). 12 tablet 0  . linaclotide (LINZESS) 145 MCG CAPS capsule Take 1 capsule (145 mcg total) by mouth daily before breakfast. 90 capsule 1  . metFORMIN (GLUCOPHAGE-XR) 500 MG 24 hr tablet TAKE 1 TABLET(500 MG) BY MOUTH DAILY WITH BREAKFAST 90 tablet 3  . Semaglutide,0.25 or 0.5MG /DOS, (OZEMPIC, 0.25 OR 0.5 MG/DOSE,) 2 MG/1.5ML SOPN Inject 0.5 mg into the skin once a week. 4 pen 5  . terbinafine (LAMISIL) 250 MG tablet Take 1 tablet (250 mg total) by mouth daily. 90 tablet 0  . valsartan (DIOVAN) 160 MG tablet TAKE 1 TABLET(160 MG) BY MOUTH DAILY 90 tablet 3   No current facility-administered medications for this visit.    Allergies  Allergen Reactions  . Atorvastatin Swelling    " lip swelling "    . Lisinopril Cough    Other reaction(s): Cough (ALLERGY/intolerance)

## 2019-02-03 ENCOUNTER — Ambulatory Visit: Payer: Managed Care, Other (non HMO) | Admitting: Sports Medicine

## 2019-02-06 ENCOUNTER — Other Ambulatory Visit: Payer: Self-pay

## 2019-02-06 ENCOUNTER — Encounter: Payer: Self-pay | Admitting: Sports Medicine

## 2019-02-06 ENCOUNTER — Ambulatory Visit (INDEPENDENT_AMBULATORY_CARE_PROVIDER_SITE_OTHER): Payer: Managed Care, Other (non HMO) | Admitting: Sports Medicine

## 2019-02-06 DIAGNOSIS — M5416 Radiculopathy, lumbar region: Secondary | ICD-10-CM | POA: Diagnosis not present

## 2019-02-06 DIAGNOSIS — M4807 Spinal stenosis, lumbosacral region: Secondary | ICD-10-CM

## 2019-02-06 MED ORDER — TRAMADOL HCL 50 MG PO TABS: 50.0000 mg | ORAL_TABLET | Freq: Three times a day (TID) | ORAL | 0 refills | Status: DC | PRN

## 2019-02-06 MED ORDER — MELOXICAM 15 MG PO TABS: ORAL_TABLET | ORAL | 3 refills | Status: DC

## 2019-02-06 NOTE — Progress Notes (Signed)
Subjective:    I'm seeing this patient as a consultation for: Dr. Emeterio Reeve  CC: Left thigh numbness  HPI: For months this pleasant 63 year old male has had pain and numbness in his left anterior thigh, not past the knee, worse with standing and walking, moderate, persistent, no bowel or bladder dysfunction saddle numbness, constitutional symptoms, no trauma.  He has had multiple medications including gabapentin without any efficacy.  I reviewed the past medical history, family history, social history, surgical history, and allergies today and no changes were needed.  Please see the problem list section below in epic for further details.  Past Medical History: Past Medical History:  Diagnosis Date  . Asthma   . Controlled type 2 diabetes mellitus without complication, without long-term current use of insulin ([place]) 03/22/2017  . ED (erectile dysfunction)   . Hyperlipidemia   . Hypertension    Past Surgical History: Past Surgical History:  Procedure Laterality Date  . HERNIA REPAIR     As per patient - 2012, 2013, 2015   Social History: Social History   Socioeconomic History  . Marital status: Married    Spouse name: Wilford Sports  . Number of children: 8  . Years of education: 26  . Highest education level: High school graduate  Occupational History  . Occupation: Chef  Social Needs  . Financial resource strain: Not on file  . Food insecurity    Worry: Not on file    Inability: Not on file  . Transportation needs    Medical: Not on file    Non-medical: Not on file  Tobacco Use  . Smoking status: Former Smoker    Quit date: 03/02/1977    Years since quitting: 41.9  . Smokeless tobacco: Never Used  Substance and Sexual Activity  . Alcohol use: No  . Drug use: No  . Sexual activity: Yes    Partners: Female    Birth control/protection: None  Lifestyle  . Physical activity    Days per week: 2 days    Minutes per session: 20 min  . Stress: Not on file   Relationships  . Social Herbalist on phone: Not on file    Gets together: Not on file    Attends religious service: Not on file    Active member of club or organization: Not on file    Attends meetings of clubs or organizations: Not on file    Relationship status: Not on file  Other Topics Concern  . Not on file  Social History Narrative  . Not on file   Family History: Family History  Problem Relation Age of Onset  . Hypertension Mother   . Hypertension Father    Allergies: Allergies  Allergen Reactions  . Atorvastatin Swelling    " lip swelling "    . Lisinopril Cough    Other reaction(s): Cough (ALLERGY/intolerance)   Medications: See med rec.  Review of Systems: No headache, visual changes, nausea, vomiting, diarrhea, constipation, dizziness, abdominal pain, skin rash, fevers, chills, night sweats, weight loss, swollen lymph nodes, body aches, joint swelling, muscle aches, chest pain, shortness of breath, mood changes, visual or auditory hallucinations.   Objective:   General: Well Developed, well nourished, and in no acute distress.  Neuro:  Extra-ocular muscles intact, able to move all 4 extremities, sensation grossly intact.  Deep tendon reflexes tested were normal. Psych: Alert and oriented, mood congruent with affect. ENT:  Ears and nose appear unremarkable.  Hearing grossly  normal. Neck: Unremarkable overall appearance, trachea midline.  No visible thyroid enlargement. Eyes: Conjunctivae and lids appear unremarkable.  Pupils equal and round. Skin: Warm and dry, no rashes noted.  Cardiovascular: Pulses palpable, no extremity edema. Back Exam:  Inspection: Unremarkable  Motion: Flexion 45 deg, Extension 45 deg, Side Bending to 45 deg bilaterally,  Rotation to 45 deg bilaterally  SLR laying: Negative  XSLR laying: Negative  Palpable tenderness: None. FABER: negative. Sensory change: Gross sensation intact to all lumbar and sacral dermatomes.   Reflexes: 2+ at both patellar tendons, 2+ at achilles tendons, Babinski's downgoing.  Strength at foot  Plantar-flexion: 5/5 Dorsi-flexion: 5/5 Eversion: 5/5 Inversion: 5/5  Leg strength  Quad: 5/5 Hamstring: 5/5 Hip flexor: 5/5 Hip abductors: 5/5  Gait unremarkable.  Impression and Recommendations:   This case required medical decision making of moderate complexity.  Left lumbar radiculitis Left L4 distribution radiculitis that has not responded to months of conservative measures including multiple medications including gabapentin. At this point proceeding with MRI for interventional planning, likely a left L4-L5 transforaminal epidural, adding meloxicam and tramadol for breakthrough pain in the meantime. He will likely need his MRI scheduled for tomorrow.   ___________________________________________ Gwen Her. Dianah Field, M.D., ABFM., CAQSM. Primary Care and Sports Medicine [facility]  Adjunct Professor of [employer]

## 2019-02-06 NOTE — Assessment & Plan Note (Signed)
Left L4 distribution radiculitis that has not responded to months of conservative measures including multiple medications including gabapentin. At this point proceeding with MRI for interventional planning, likely a left L4-L5 transforaminal epidural, adding meloxicam and tramadol for breakthrough pain in the meantime. He will likely need his MRI scheduled for tomorrow.

## 2019-02-14 ENCOUNTER — Ambulatory Visit (INDEPENDENT_AMBULATORY_CARE_PROVIDER_SITE_OTHER): Payer: Managed Care, Other (non HMO)

## 2019-02-14 ENCOUNTER — Other Ambulatory Visit: Payer: Self-pay

## 2019-02-14 DIAGNOSIS — M5416 Radiculopathy, lumbar region: Secondary | ICD-10-CM | POA: Diagnosis not present

## 2019-02-14 DIAGNOSIS — M4807 Spinal stenosis, lumbosacral region: Secondary | ICD-10-CM | POA: Diagnosis not present

## 2019-02-17 ENCOUNTER — Ambulatory Visit (INDEPENDENT_AMBULATORY_CARE_PROVIDER_SITE_OTHER): Payer: Managed Care, Other (non HMO) | Admitting: Sports Medicine

## 2019-02-17 ENCOUNTER — Encounter: Payer: Self-pay | Admitting: Sports Medicine

## 2019-02-17 DIAGNOSIS — M5416 Radiculopathy, lumbar region: Secondary | ICD-10-CM

## 2019-02-17 NOTE — Assessment & Plan Note (Signed)
Left L4 distribution radiculitis, the L4-L5 disc on the left does come close to the L4 nerve root. Proceeding with a left L4-L5 interlaminar epidural, oral medications have not been effective. If no relief, not even temporary from the epidural we will proceed with a lateral femoral cutaneous nerve hydrodissection at the ASIS. Return to see me 1 month after the injection.

## 2019-02-17 NOTE — Progress Notes (Signed)
Subjective:    CC: Follow-up  HPI: Chistian returns, we are treating for left anterior thigh numbness and tingling, differential is meralgia paresthetica and lumbar radiculitis, he does have some disc protrusions coming close to the left L4 nerve root, oral medications have not been effective.  I reviewed the past medical history, family history, social history, surgical history, and allergies today and no changes were needed.  Please see the problem list section below in epic for further details.  Past Medical History: Past Medical History:  Diagnosis Date  . Asthma   . Controlled type 2 diabetes mellitus without complication, without long-term current use of insulin ([place]) 03/22/2017  . ED (erectile dysfunction)   . Hyperlipidemia   . Hypertension    Past Surgical History: Past Surgical History:  Procedure Laterality Date  . HERNIA REPAIR     As per patient - 2012, 2013, 2015   Social History: Social History   Socioeconomic History  . Marital status: Married    Spouse name: Wilford Sports  . Number of children: 8  . Years of education: 9  . Highest education level: High school graduate  Occupational History  . Occupation: Chef  Tobacco Use  . Smoking status: Former Smoker    Quit date: 03/02/1977    Years since quitting: 41.9  . Smokeless tobacco: Never Used  Substance and Sexual Activity  . Alcohol use: No  . Drug use: No  . Sexual activity: Yes    Partners: Female    Birth control/protection: None  Other Topics Concern  . Not on file  Social History Narrative  . Not on file   Social Determinants of Health   Financial Resource Strain:   . Difficulty of Paying Living Expenses: Not on file  Food Insecurity:   . Worried About Charity fundraiser in the Last Year: Not on file  . Ran Out of Food in the Last Year: Not on file  Transportation Needs:   . Lack of Transportation (Medical): Not on file  . Lack of Transportation (Non-Medical): Not on file  Physical Activity:    . Days of Exercise per Week: Not on file  . Minutes of Exercise per Session: Not on file  Stress:   . Feeling of Stress : Not on file  Social Connections:   . Frequency of Communication with Friends and Family: Not on file  . Frequency of Social Gatherings with Friends and Family: Not on file  . Attends Religious Services: Not on file  . Active Member of Clubs or Organizations: Not on file  . Attends Archivist Meetings: Not on file  . Marital Status: Not on file   Family History: Family History  Problem Relation Age of Onset  . Hypertension Mother   . Hypertension Father    Allergies: Allergies  Allergen Reactions  . Atorvastatin Swelling    " lip swelling "    . Lisinopril Cough    Other reaction(s): Cough (ALLERGY/intolerance)   Medications: See med rec.  Review of Systems: No fevers, chills, night sweats, weight loss, chest pain, or shortness of breath.   Objective:    General: Well Developed, well nourished, and in no acute distress.  Neuro: Alert and oriented x3, extra-ocular muscles intact, sensation grossly intact.  HEENT: Normocephalic, atraumatic, pupils equal round reactive to light, neck supple, no masses, no lymphadenopathy, thyroid nonpalpable.  Skin: Warm and dry, no rashes. Cardiac: Regular rate and rhythm, no murmurs rubs or gallops, no lower extremity edema.  Respiratory: Clear to auscultation bilaterally. Not using accessory muscles, speaking in full sentences.  Impression and Recommendations:    Left lumbar radiculitis Left L4 distribution radiculitis, the L4-L5 disc on the left does come close to the L4 nerve root. Proceeding with a left L4-L5 interlaminar epidural, oral medications have not been effective. If no relief, not even temporary from the epidural we will proceed with a lateral femoral cutaneous nerve hydrodissection at the ASIS. Return to see me 1 month after the injection.   ___________________________________________  Gwen Her. Dianah Field, M.D., ABFM., CAQSM. Primary Care and Sports Medicine [facility]  Adjunct Professor of [place] of [facility] of Medicine

## 2019-02-21 ENCOUNTER — Ambulatory Visit
Admission: RE | Admit: 2019-02-21 | Discharge: 2019-02-21 | Disposition: A | Discharge status: Home / Self Care | Payer: Managed Care, Other (non HMO) | Source: Ambulatory Visit | Attending: Sports Medicine | Admitting: Sports Medicine

## 2019-02-21 MED ORDER — IOPAMIDOL (ISOVUE-M 200) INJECTION 41%
1.0000 mL | Freq: Once | INTRAMUSCULAR | Status: AC
  Administered 2019-02-21: 1 mL via EPIDURAL

## 2019-02-21 MED ORDER — METHYLPREDNISOLONE ACETATE 40 MG/ML INJ SUSP (RADIOLOG
120.0000 mg | Freq: Once | INTRAMUSCULAR | Status: AC
  Administered 2019-02-21: 120 mg via EPIDURAL

## 2019-02-21 NOTE — Discharge Instructions (Signed)

## 2019-03-22 ENCOUNTER — Encounter: Payer: Self-pay | Admitting: Osteopathic Medicine

## 2019-03-22 ENCOUNTER — Ambulatory Visit (INDEPENDENT_AMBULATORY_CARE_PROVIDER_SITE_OTHER): Payer: Managed Care, Other (non HMO) | Admitting: Osteopathic Medicine

## 2019-03-22 ENCOUNTER — Other Ambulatory Visit: Payer: Self-pay

## 2019-03-22 VITALS — BP 133/87 | HR 94 | Temp 98.3°F | Wt 195.0 lb

## 2019-03-22 DIAGNOSIS — Z23 Encounter for immunization: Secondary | ICD-10-CM

## 2019-03-22 DIAGNOSIS — E119 Type 2 diabetes mellitus without complications: Secondary | ICD-10-CM

## 2019-03-22 LAB — POCT GLYCOSYLATED HEMOGLOBIN (HGB A1C): Hemoglobin A1C: 5.9 % — AB (ref 4.0–5.6)

## 2019-03-22 NOTE — Progress Notes (Signed)
HPI: Justin Frank is a 64 y.o. male who  has a past medical history of Asthma, Controlled type 2 diabetes mellitus without complication, without long-term current use of insulin ([place]) (03/22/2017), ED (erectile dysfunction), Hyperlipidemia, and Hypertension.  he presents to [facility] today, 03/22/19,  for chief complaint of:  DM2 HTN  Doing really well overall! No major problems lately. Has been successful w/ intentional weight loss. He's managing well w/ current medications. A1C last visit 12/21/18 was 6.1, today 03/22/19 is 5.9  BP Readings :  03/22/19 133/87  02/21/19 134/86  02/17/19 129/75  02/06/19 134/74   Wt Readings:  03/22/19  195 lb  02/17/19 196 lb (88.9 kg)  02/06/19 197 lb (89.4 kg)  12/21/18 195 lb (88.5 kg)      At today's visit 03/22/19 ... PMH, PSH, FH reviewed and updated as needed.  Current medication list and allergy/intolerance hx reviewed and updated as needed. (See remainder of HPI, ROS, Phys Exam below)   No results found.  No results found for this or any previous visit (from the past 72 hour(s)).        ASSESSMENT/PLAN: The primary encounter diagnosis was Controlled type 2 diabetes mellitus without complication, without long-term current use of insulin ([place]). A diagnosis of Need for shingles vaccine was also pertinent to this visit.  Chronic conditions under pretty good control Labs 3 mos ago ok   Orders Placed This Encounter  Procedures  . Varicella-zoster vaccine IM (Shingrix)  . POCT HgB A1C     No orders of the defined types were placed in this encounter.       Follow-up plan: Return in about 3 months (around 06/20/2019) for recheck A1C, second shingles shot  .                                                 ################################################# ################################################# ################################################# #################################################    No outpatient medications have been marked as taking for the 03/22/19 encounter (Office Visit) with Emeterio Reeve, DO.    Allergies  Allergen Reactions  . Atorvastatin Swelling    " lip swelling "    . Lisinopril Cough    Other reaction(s): Cough (ALLERGY/intolerance)     Exam:  BP 133/87 (BP Location: Left Arm, Patient Position: Sitting, Cuff Size: Large)   Pulse 94   Temp 98.3 F (36.8 C) (Oral)   Wt 195 lb 0.6 oz (88.5 kg)   BMI 33.48 kg/m   Constitutional: VS see above. General Appearance: alert, well-developed, well-nourished, NAD  Eyes: Normal lids and conjunctive, non-icteric sclera  Ears, Nose, Mouth, Throat: MMM, Normal external inspection ears/nares/mouth/lips/gums.  Neck: No masses, trachea midline.   Respiratory: Normal respiratory effort. no wheeze, no rhonchi, no rales  Cardiovascular: S1/S2 normal, no murmur, no rub/gallop auscultated. RRR.   Musculoskeletal: Gait normal. Symmetric and independent movement of all extremities. Normal strength to hip flex/ext, knee flex/ext, no midline spinal tenderness,, normal sensation L and R thigh symmetrically. Neg SLR bilaterally.   Neurological: Normal balance/coordination. No tremor.  Skin: warm, dry, intact.   Psychiatric: Normal judgment/insight. Normal mood and affect. Oriented x3.       Visit summary with medication list and pertinent instructions was printed for patient to review, patient was advised to alert [place] if any updates are needed. All questions at time of visit  were answered - patient instructed to contact office with any additional concerns. ER/RTC precautions were reviewed with the patient and  understanding verbalized.     Please note: voice recognition software was used to produce this document, and typos may escape review. Please contact Dr. Sheppard Coil for any needed clarifications.    Follow up plan: Return in about 3 months (around 06/20/2019) for recheck A1C, second shingles shot .

## 2019-03-23 ENCOUNTER — Encounter: Payer: Self-pay | Admitting: Osteopathic Medicine

## 2019-03-24 MED ORDER — CETIRIZINE HCL 10 MG PO TABS: 10.0000 mg | ORAL_TABLET | Freq: Every day | ORAL | 1 refills | Status: DC

## 2019-03-24 MED ORDER — TERBINAFINE HCL 250 MG PO TABS: 250.0000 mg | ORAL_TABLET | Freq: Every day | ORAL | 0 refills | Status: DC

## 2019-04-11 ENCOUNTER — Ambulatory Visit (INDEPENDENT_AMBULATORY_CARE_PROVIDER_SITE_OTHER): Payer: Managed Care, Other (non HMO)

## 2019-04-11 ENCOUNTER — Ambulatory Visit (INDEPENDENT_AMBULATORY_CARE_PROVIDER_SITE_OTHER): Payer: Managed Care, Other (non HMO) | Admitting: Sports Medicine

## 2019-04-11 ENCOUNTER — Encounter: Payer: Self-pay | Admitting: Sports Medicine

## 2019-04-11 ENCOUNTER — Other Ambulatory Visit: Payer: Self-pay

## 2019-04-11 DIAGNOSIS — M7732 Calcaneal spur, left foot: Secondary | ICD-10-CM

## 2019-04-11 DIAGNOSIS — M2141 Flat foot [pes planus] (acquired), right foot: Secondary | ICD-10-CM

## 2019-04-11 DIAGNOSIS — M79671 Pain in right foot: Secondary | ICD-10-CM | POA: Diagnosis not present

## 2019-04-11 DIAGNOSIS — M2142 Flat foot [pes planus] (acquired), left foot: Secondary | ICD-10-CM

## 2019-04-11 DIAGNOSIS — M5416 Radiculopathy, lumbar region: Secondary | ICD-10-CM

## 2019-04-11 DIAGNOSIS — M2140 Flat foot [pes planus] (acquired), unspecified foot: Secondary | ICD-10-CM | POA: Insufficient documentation

## 2019-04-11 NOTE — Progress Notes (Signed)
    Procedures performed today:    None.  Independent interpretation of tests performed by another provider:   Lumbar spine MRI again reviewed, notable L4-L5 degenerative disc disease.  Impression and Recommendations:    Left lumbar radiculitis Left L4 distribution radiculitis, L4-L5 disc protrusion, he had an left L4-L5 interlaminar epidural after failure of oral medications and he reports 50% relief in back pain and anterior thigh pain. We are going to proceed with epidurals #2 and #3.   Pes planus Jadyne has had a long history of foot pain, predominantly at the metatarsal heads on the plantar aspect as well as mid arch. On examination he has pes planus. I think he would benefit from custom molded orthotics as well as avoidance of barefoot walking, referral to Dr. Raeford Razor for the orthotics. Bilateral foot x-rays. Return to see me in 1 month.    ___________________________________________ Gwen Her. Dianah Field, M.D., ABFM., CAQSM. Primary Care and [place] Instructor of [place] of [facility] of Medicine

## 2019-04-11 NOTE — Assessment & Plan Note (Addendum)
Justin Frank has had a long history of foot pain, predominantly at the metatarsal heads on the plantar aspect as well as mid arch. On examination he has pes planus. I think he would benefit from custom molded orthotics as well as avoidance of barefoot walking, referral to Dr. Raeford Razor for the orthotics. Bilateral foot x-rays. Return to see me in 1 month.

## 2019-04-11 NOTE — Assessment & Plan Note (Signed)
Left L4 distribution radiculitis, L4-L5 disc protrusion, he had an left L4-L5 interlaminar epidural after failure of oral medications and he reports 50% relief in back pain and anterior thigh pain. We are going to proceed with epidurals #2 and #3.

## 2019-04-13 ENCOUNTER — Other Ambulatory Visit: Payer: Self-pay

## 2019-04-13 DIAGNOSIS — E119 Type 2 diabetes mellitus without complications: Secondary | ICD-10-CM

## 2019-04-13 MED ORDER — METFORMIN HCL ER 500 MG PO TB24: ORAL_TABLET | ORAL | 1 refills | Status: DC

## 2019-04-18 ENCOUNTER — Encounter: Payer: Self-pay | Admitting: Sports Medicine

## 2019-04-18 ENCOUNTER — Other Ambulatory Visit: Payer: Self-pay

## 2019-04-18 ENCOUNTER — Ambulatory Visit (INDEPENDENT_AMBULATORY_CARE_PROVIDER_SITE_OTHER): Payer: Managed Care, Other (non HMO) | Admitting: Sports Medicine

## 2019-04-18 ENCOUNTER — Other Ambulatory Visit: Payer: Managed Care, Other (non HMO)

## 2019-04-18 DIAGNOSIS — M1611 Unilateral primary osteoarthritis, right hip: Secondary | ICD-10-CM | POA: Diagnosis not present

## 2019-04-18 DIAGNOSIS — M1712 Unilateral primary osteoarthritis, left knee: Secondary | ICD-10-CM | POA: Diagnosis not present

## 2019-04-18 DIAGNOSIS — M160 Bilateral primary osteoarthritis of hip: Secondary | ICD-10-CM | POA: Insufficient documentation

## 2019-04-18 NOTE — Progress Notes (Addendum)
    Procedures performed today:    Procedure: Real-time Ultrasound Guided injection of the right hip joint Device: Samsung HS60  Verbal informed consent obtained.  Time-out conducted.  Noted no overlying erythema, induration, or other signs of local infection.  Skin prepped in a sterile fashion.  Local anesthesia: Topical Ethyl chloride.  With sterile technique and under real time ultrasound guidance: 1 cc Kenalog 40, 2 cc lidocaine, 2 cc bupivacaine injected easily Completed without difficulty  Pain immediately resolved suggesting accurate placement of the medication.  Advised to call if fevers/chills, erythema, induration, drainage, or persistent bleeding.  Images permanently stored and available for review in the ultrasound unit.  Impression: Technically successful ultrasound guided injection.  Procedure: Real-time Ultrasound Guided  aspiration/injection of left knee Device: Samsung HS60  Verbal informed consent obtained.  Time-out conducted.  Noted no overlying erythema, induration, or other signs of local infection.  Skin prepped in a sterile fashion.  Local anesthesia: Topical Ethyl chloride.  With sterile technique and under real time ultrasound guidance:  Using an 18-gauge needle aspirated 26 cc of clear, straw-colored fluid, syringe switched and 1 cc Kenalog 40, 2 cc lidocaine, 2 cc bupivacaine injected easily Completed without difficulty  Pain immediately resolved suggesting accurate placement of the medication.  Advised to call if fevers/chills, erythema, induration, drainage, or persistent bleeding.  Images permanently stored and available for review in the ultrasound unit.  Impression: Technically successful ultrasound guided injection.  Independent interpretation of tests performed by another provider:   None.  Impression and Recommendations:    Primary osteoarthritis of right hip Phill has had right hip pain for some time now, he did have femoroacetabular  injections with Dr. Georgina Snell back in July 2020 on the right and August 2020 on the left that were very effective. Now having recurrence of pain in the right groin, desires repeat interventional treatment today, right hip joint injection, return in 1 month.   X-rays in February did show bilateral hip osteoarthritis  Primary osteoarthritis of left knee Increasing left knee pain, tightness, swelling. Aspiration and injection today. I do need some x-rays, return to see me in a month for this.    ___________________________________________ Gwen Her. Dianah Field, M.D., ABFM., CAQSM. Primary Care and [place] Instructor of [place] of [facility] of Medicine

## 2019-04-18 NOTE — Assessment & Plan Note (Addendum)
Justin Frank has had right hip pain for some time now, he did have femoroacetabular injections with Dr. Georgina Snell back in July 2020 on the right and August 2020 on the left that were very effective. Now having recurrence of pain in the right groin, desires repeat interventional treatment today, right hip joint injection, return in 1 month.   X-rays in February did show bilateral hip osteoarthritis

## 2019-04-18 NOTE — Assessment & Plan Note (Signed)
Increasing left knee pain, tightness, swelling. Aspiration and injection today. I do need some x-rays, return to see me in a month for this.

## 2019-04-19 ENCOUNTER — Ambulatory Visit (INDEPENDENT_AMBULATORY_CARE_PROVIDER_SITE_OTHER): Payer: Managed Care, Other (non HMO) | Admitting: Family Medicine

## 2019-04-19 ENCOUNTER — Encounter: Payer: Self-pay | Admitting: Family Medicine

## 2019-04-19 DIAGNOSIS — M2142 Flat foot [pes planus] (acquired), left foot: Secondary | ICD-10-CM

## 2019-04-19 DIAGNOSIS — M2141 Flat foot [pes planus] (acquired), right foot: Secondary | ICD-10-CM | POA: Diagnosis not present

## 2019-04-19 NOTE — Progress Notes (Signed)
Justin Frank - 64 y.o. male MRN [id number]  Date of birth: [birth year]  SUBJECTIVE:  Including CC & ROS.  Chief Complaint  Patient presents with  . Foot Orthotics    Justin Frank is a 64 y.o. male that is presenting with bilateral foot pain.  He stands at work and gets pain over the arch as well as the plantar aspect of the first MTP joint bilaterally.  Denies any inciting event or trauma   Review of Systems See HPI   HISTORY: Past Medical, Surgical, Social, and Family History Reviewed & Updated per EMR.   Pertinent Historical Findings include:  Past Medical History:  Diagnosis Date  . Asthma   . Controlled type 2 diabetes mellitus without complication, without long-term current use of insulin ([place]) 03/22/2017  . ED (erectile dysfunction)   . Hyperlipidemia   . Hypertension     Past Surgical History:  Procedure Laterality Date  . HERNIA REPAIR     As per patient - 2012, 2013, 2015    Family History  Problem Relation Age of Onset  . Hypertension Mother   . Hypertension Father     Social History   Socioeconomic History  . Marital status: Married    Spouse name: Justin Frank  . Number of children: 8  . Years of education: 8  . Highest education level: High school graduate  Occupational History  . Occupation: Chef  Tobacco Use  . Smoking status: Former Smoker    Quit date: 03/02/1977    Years since quitting: 42.1  . Smokeless tobacco: Never Used  Substance and Sexual Activity  . Alcohol use: No  . Drug use: No  . Sexual activity: Yes    Partners: Female    Birth control/protection: None  Other Topics Concern  . Not on file  Social History Narrative  . Not on file   Social Determinants of Health   Financial Resource Strain:   . Difficulty of Paying Living Expenses: Not on file  Food Insecurity:   . Worried About Charity fundraiser in the Last Year: Not on file  . Ran Out of Food in the Last Year: Not on file  Transportation Needs:   . Lack of  Transportation (Medical): Not on file  . Lack of Transportation (Non-Medical): Not on file  Physical Activity:   . Days of Exercise per Week: Not on file  . Minutes of Exercise per Session: Not on file  Stress:   . Feeling of Stress : Not on file  Social Connections:   . Frequency of Communication with Friends and Family: Not on file  . Frequency of Social Gatherings with Friends and Family: Not on file  . Attends Religious Services: Not on file  . Active Member of Clubs or Organizations: Not on file  . Attends Archivist Meetings: Not on file  . Marital Status: Not on file  Intimate Partner Violence:   . Fear of Current or Ex-Partner: Not on file  . Emotionally Abused: Not on file  . Physically Abused: Not on file  . Sexually Abused: Not on file     PHYSICAL EXAM:  VS: BP (!) 143/87   Pulse 98   Ht 5\' 4"  (1.626 m)   Wt 196 lb (88.9 kg)   BMI 33.64 kg/m  Physical Exam Gen: NAD, alert, cooperative with exam, well-appearing MSK:  Right and left foot: Hallux valgus bilaterally. Pes planus. No swelling or ecchymosis. Normal range of motion. Neurovascularly intact  Patient was fitted for a standard, cushioned, semi-rigid orthotic. The orthotic was heated and afterward the patient stood on the orthotic blank positioned on the orthotic stand. The patient was positioned in subtalar neutral position and 10 degrees of ankle dorsiflexion in a weight bearing stance. After completion of molding, a stable base was applied to the orthotic blank. The blank was ground to a stable position for weight bearing. Size: 10 Base: Blue EVA Additional Posting and Padding: First ray post bilaterally The patient ambulated these, and they were very comfortable.   ASSESSMENT & PLAN:   Pes planus Foot pain worse with standing all day at work. -Orthotics. -Counseled on supportive care. -Could consider adding scaphoid pads as well.

## 2019-04-19 NOTE — Assessment & Plan Note (Signed)
Foot pain worse with standing all day at work. -Orthotics. -Counseled on supportive care. -Could consider adding scaphoid pads as well.

## 2019-04-20 ENCOUNTER — Inpatient Hospital Stay: Admission: RE | Admit: 2019-04-20 | Payer: Managed Care, Other (non HMO) | Source: Ambulatory Visit

## 2019-04-24 ENCOUNTER — Inpatient Hospital Stay: Admission: RE | Admit: 2019-04-24 | Payer: Managed Care, Other (non HMO) | Source: Ambulatory Visit

## 2019-05-02 ENCOUNTER — Other Ambulatory Visit: Payer: Self-pay

## 2019-05-02 ENCOUNTER — Telehealth: Payer: Self-pay

## 2019-05-02 ENCOUNTER — Ambulatory Visit
Admission: RE | Admit: 2019-05-02 | Discharge: 2019-05-02 | Disposition: A | Discharge status: Home / Self Care | Payer: Managed Care, Other (non HMO) | Source: Ambulatory Visit | Attending: Sports Medicine | Admitting: Sports Medicine

## 2019-05-02 DIAGNOSIS — M5416 Radiculopathy, lumbar region: Secondary | ICD-10-CM

## 2019-05-02 MED ORDER — METHYLPREDNISOLONE ACETATE 40 MG/ML INJ SUSP (RADIOLOG
120.0000 mg | Freq: Once | INTRAMUSCULAR | Status: AC
  Administered 2019-05-02: 120 mg via EPIDURAL

## 2019-05-02 MED ORDER — IOPAMIDOL (ISOVUE-M 200) INJECTION 41%
1.0000 mL | Freq: Once | INTRAMUSCULAR | Status: AC
  Administered 2019-05-02: 08:00:00 1 mL via EPIDURAL

## 2019-05-02 MED ORDER — OMEPRAZOLE 40 MG PO CPDR: 40.0000 mg | DELAYED_RELEASE_CAPSULE | Freq: Every day | ORAL | 0 refills | Status: DC

## 2019-05-02 NOTE — Telephone Encounter (Signed)
[facility] pharmacy requesting med refill for omeprazole. Rx not listed in active med list.

## 2019-05-02 NOTE — Discharge Instructions (Signed)

## 2019-05-16 ENCOUNTER — Encounter: Payer: Self-pay | Admitting: Sports Medicine

## 2019-05-16 ENCOUNTER — Ambulatory Visit (INDEPENDENT_AMBULATORY_CARE_PROVIDER_SITE_OTHER): Payer: Managed Care, Other (non HMO) | Admitting: Sports Medicine

## 2019-05-16 ENCOUNTER — Other Ambulatory Visit: Payer: Self-pay

## 2019-05-16 DIAGNOSIS — M5416 Radiculopathy, lumbar region: Secondary | ICD-10-CM

## 2019-05-16 DIAGNOSIS — M1712 Unilateral primary osteoarthritis, left knee: Secondary | ICD-10-CM | POA: Diagnosis not present

## 2019-05-16 DIAGNOSIS — M1611 Unilateral primary osteoarthritis, right hip: Secondary | ICD-10-CM

## 2019-05-16 NOTE — Assessment & Plan Note (Signed)
Lastly, after failure of conservative treatment we did an injection into Justin Frank's right femoral acetabular joint, he returns today pain-free. X-rays back in February did show bilateral hip osteoarthritis.

## 2019-05-16 NOTE — Progress Notes (Signed)
    Procedures performed today:    None.  Independent interpretation of notes and tests performed by another provider:   None.  Impression and Recommendations:    Left lumbar radiculitis Eathyn returns, he had two left L4-L5 interlaminar epidurals and reports 80% pain relief, he is happy with how things are going, no further intervention needed.  Primary osteoarthritis of left knee At the last visit I aspirated and injected Jalani's knee, he is also pain-free with regards to his knee.  Primary osteoarthritis of right hip Lastly, after failure of conservative treatment we did an injection into Jasmond's right femoral acetabular joint, he returns today pain-free. X-rays back in February did show bilateral hip osteoarthritis.    ___________________________________________ Gwen Her. Dianah Field, M.D., ABFM., CAQSM. Primary Care and [place] Instructor of [place] of [facility] of Medicine

## 2019-05-16 NOTE — Assessment & Plan Note (Signed)
Lido returns, he had two left L4-L5 interlaminar epidurals and reports 80% pain relief, he is happy with how things are going, no further intervention needed.

## 2019-05-16 NOTE — Assessment & Plan Note (Signed)
At the last visit I aspirated and injected Justin Frank's knee, he is also pain-free with regards to his knee.

## 2019-06-09 ENCOUNTER — Other Ambulatory Visit: Payer: Self-pay | Admitting: Osteopathic Medicine

## 2019-06-09 MED ORDER — FLUTICASONE PROPIONATE 50 MCG/ACT NA SUSP: NASAL | 11 refills | Status: DC

## 2019-06-13 ENCOUNTER — Other Ambulatory Visit: Payer: Self-pay

## 2019-06-14 ENCOUNTER — Other Ambulatory Visit: Payer: Self-pay

## 2019-06-14 DIAGNOSIS — I10 Essential (primary) hypertension: Secondary | ICD-10-CM

## 2019-06-14 MED ORDER — AMLODIPINE BESYLATE 10 MG PO TABS: 10.0000 mg | ORAL_TABLET | Freq: Every day | ORAL | 0 refills | Status: DC

## 2019-06-26 ENCOUNTER — Ambulatory Visit (INDEPENDENT_AMBULATORY_CARE_PROVIDER_SITE_OTHER): Payer: Managed Care, Other (non HMO) | Admitting: Osteopathic Medicine

## 2019-06-26 ENCOUNTER — Other Ambulatory Visit: Payer: Self-pay

## 2019-06-26 ENCOUNTER — Encounter: Payer: Self-pay | Admitting: Osteopathic Medicine

## 2019-06-26 VITALS — BP 134/86 | HR 88 | Ht 64.17 in | Wt 194.0 lb

## 2019-06-26 DIAGNOSIS — Z23 Encounter for immunization: Secondary | ICD-10-CM

## 2019-06-26 DIAGNOSIS — E119 Type 2 diabetes mellitus without complications: Secondary | ICD-10-CM

## 2019-06-26 DIAGNOSIS — Z125 Encounter for screening for malignant neoplasm of prostate: Secondary | ICD-10-CM | POA: Diagnosis not present

## 2019-06-26 LAB — POCT GLYCOSYLATED HEMOGLOBIN (HGB A1C): Hemoglobin A1C: 6 % — AB (ref 4.0–5.6)

## 2019-06-26 MED ORDER — OZEMPIC (1 MG/DOSE) 2 MG/1.5ML ~~LOC~~ SOPN: 1.0000 mg | PEN_INJECTOR | SUBCUTANEOUS | 11 refills | Status: DC

## 2019-06-26 NOTE — Progress Notes (Signed)
HPI: Justin Frank is a 64 y.o. male who  has a past medical history of Asthma, Controlled type 2 diabetes mellitus without complication, without long-term current use of insulin ([place]) (03/22/2017), ED (erectile dysfunction), Hyperlipidemia, and Hypertension.  he presents to [facility] today, 06/26/19,  for chief complaint of:  DM2 HTN  Doing really well overall! No major problems lately. Has been successful w/ intentional weight loss. He's managing well w/ current medications. A1C last visit 12/21/18 was 6.1, 03/22/19 was 5.9, today 06/26/19 is 6.0 - pt would like to increase Ozempic   BP Readings :  06/26/19  134/86  03/22/19 133/87  02/21/19 134/86  02/17/19 129/75  02/06/19 134/74   Wt Readings:  06/26/19  194 lb  03/22/19  195 lb  02/17/19 196 lb (88.9 kg)  02/06/19 197 lb (89.4 kg)  12/21/18 195 lb (88.5 kg)      At today's visit 06/26/19 ... PMH, PSH, FH reviewed and updated as needed.  Current medication list and allergy/intolerance hx reviewed and updated as needed. (See remainder of HPI, ROS, Phys Exam below)   No results found.  Results for orders placed or performed in visit on 06/26/19 (from the past 72 hour(s))  POCT HgB A1C     Status: Abnormal   Collection Time: 06/26/19  3:58 PM  Result Value Ref Range   Hemoglobin A1C 6.0 (A) 4.0 - 5.6 %   HbA1c POC (<> result, manual entry)     HbA1c, POC (prediabetic range)     HbA1c, POC (controlled diabetic range)            ASSESSMENT/PLAN: The primary encounter diagnosis was Controlled type 2 diabetes mellitus without complication, without long-term current use of insulin ([place]). Diagnoses of Need for shingles vaccine and Prostate cancer screening were also pertinent to this visit.  Chronic conditions under pretty good control Labs 3 mos ago ok   Orders Placed This Encounter  Procedures  . Varicella-zoster vaccine IM  . CBC  . COMPLETE METABOLIC PANEL WITH GFR   . Lipid panel  . PSA, Total with Reflex to PSA, Free  . POCT HgB A1C     Meds ordered this encounter  Medications  . Semaglutide, 1 MG/DOSE, (OZEMPIC, 1 MG/DOSE,) 2 MG/1.5ML SOPN    Sig: Inject 1 mg into the skin once a week.    Dispense:  6 pen    Refill:  11        Follow-up plan: Return in about 3 months (around 09/25/2019) for recheck A1C .                                                 ################################################# ################################################# ################################################# #################################################    Current Meds  Medication Sig  . amLODipine (NORVASC) 10 MG tablet Take 1 tablet (10 mg total) by mouth daily.  Marland Kitchen atorvastatin (LIPITOR) 20 MG tablet Take 1 tablet (20 mg total) by mouth daily.  . cetirizine (ZYRTEC) 10 MG tablet Take 1 tablet (10 mg total) by mouth daily.  . fluticasone (FLONASE) 50 MCG/ACT nasal spray Place 1 spray in each nostril once daily.  . hydrOXYzine (ATARAX/VISTARIL) 25 MG tablet Take 1 tablet (25 mg total) by mouth every 6 (six) hours as needed for anxiety (sleep).  Marland Kitchen linaclotide (LINZESS) 145 MCG CAPS capsule Take 1 capsule (145 mcg  total) by mouth daily before breakfast.  . metFORMIN (GLUCOPHAGE-XR) 500 MG 24 hr tablet TAKE 1 TABLET(500 MG) BY MOUTH DAILY WITH BREAKFAST  . omeprazole (PRILOSEC) 40 MG capsule Take 1 capsule (40 mg total) by mouth daily.  . valsartan (DIOVAN) 160 MG tablet TAKE 1 TABLET(160 MG) BY MOUTH DAILY  . [DISCONTINUED] Semaglutide,0.25 or 0.5MG /DOS, (OZEMPIC, 0.25 OR 0.5 MG/DOSE,) 2 MG/1.5ML SOPN Inject 0.5 mg into the skin once a week.    Allergies  Allergen Reactions  . Atorvastatin Swelling    " lip swelling "    . Lisinopril Cough    Other reaction(s): Cough (ALLERGY/intolerance)     Exam:  BP 134/86 (BP Location: Left Arm, Patient Position: Sitting, Cuff Size: Large)   Pulse  88   Ht 5' 4.17" (1.63 m)   Wt 194 lb (88 kg)   SpO2 97%   BMI 33.12 kg/m   Constitutional: VS see above. General Appearance: alert, well-developed, well-nourished, NAD  Eyes: Normal lids and conjunctive, non-icteric sclera  Ears, Nose, Mouth, Throat: MMM, Normal external inspection ears/nares/mouth/lips/gums.  Neck: No masses, trachea midline.   Respiratory: Normal respiratory effort. no wheeze, no rhonchi, no rales  Cardiovascular: S1/S2 normal, no murmur, no rub/gallop auscultated. RRR.   Musculoskeletal: Gait normal. Symmetric and independent movement of all extremities. Normal strength to hip flex/ext, knee flex/ext, no midline spinal tenderness,, normal sensation L and R thigh symmetrically. Neg SLR bilaterally.   Neurological: Normal balance/coordination. No tremor.  Skin: warm, dry, intact.   Psychiatric: Normal judgment/insight. Normal mood and affect. Oriented x3.       Visit summary with medication list and pertinent instructions was printed for patient to review, patient was advised to alert [place] if any updates are needed. All questions at time of visit were answered - patient instructed to contact office with any additional concerns. ER/RTC precautions were reviewed with the patient and understanding verbalized.     Please note: voice recognition software was used to produce this document, and typos may escape review. Please contact Dr. Sheppard Coil for any needed clarifications.    Follow up plan: Return in about 3 months (around 09/25/2019) for recheck A1C .

## 2019-06-28 ENCOUNTER — Ambulatory Visit (INDEPENDENT_AMBULATORY_CARE_PROVIDER_SITE_OTHER): Payer: Managed Care, Other (non HMO) | Admitting: Sports Medicine

## 2019-06-28 ENCOUNTER — Other Ambulatory Visit: Payer: Self-pay

## 2019-06-28 ENCOUNTER — Telehealth: Payer: Self-pay | Admitting: Sports Medicine

## 2019-06-28 DIAGNOSIS — M1712 Unilateral primary osteoarthritis, left knee: Secondary | ICD-10-CM

## 2019-06-28 NOTE — Assessment & Plan Note (Addendum)
This is a pleasant 64 year old male, he has left knee osteoarthritis, we have done oral analgesics, NSAIDs, steroid injections, rehabilitation, activity modification, he continues to have pain, his last injection was in February of this year. Because of his recurrence of discomfort we are going to proceed with approval for viscosupplementation.  Orthovisc is not preferred, preferred agents are Durolane, Euflexxa, GELSYN-3, Hyalgan, Supartz.  Switching to Euflexxa and sending to specialty pharmacy.

## 2019-06-28 NOTE — Progress Notes (Addendum)
    Procedures performed today:    None.  Independent interpretation of notes and tests performed by another provider:   None.  Brief History, Exam, Impression, and Recommendations:    Primary osteoarthritis of left knee This is a pleasant 64 year old male, he has left knee osteoarthritis, we have done oral analgesics, NSAIDs, steroid injections, rehabilitation, activity modification, he continues to have pain, his last injection was in February of this year. Because of his recurrence of discomfort we are going to proceed with approval for viscosupplementation.  Orthovisc is not preferred, preferred agents are Durolane, Euflexxa, GELSYN-3, Hyalgan, Supartz.  Switching to Euflexxa and sending to specialty pharmacy.    ___________________________________________ Gwen Her. Dianah Field, M.D., ABFM., CAQSM. Primary Care and [place] Instructor of [place] [facility] of Medicine

## 2019-06-28 NOTE — Telephone Encounter (Signed)
Justin Frank has left knee osteoarthritis, he has failed NSAIDs, rehab, steroid injections, Orthovisc approval please or other viscosupplementation preferred by his insurance company.

## 2019-06-29 LAB — LIPID PANEL
Cholesterol: 155 mg/dL (ref <200)
HDL: 54 mg/dL (ref >=40)
LDL Cholesterol (Calc): 80 mg/dL (calc)
Non-HDL Cholesterol (Calc): 101 mg/dL (calc) (ref <130)
Total CHOL/HDL Ratio: 2.9 (calc) (ref <5.0)
Triglycerides: 114 mg/dL (ref <150)

## 2019-06-29 LAB — COMPLETE METABOLIC PANEL WITH GFR
AG Ratio: 1.8 (calc) (ref 1.0–2.5)
ALT: 18 U/L (ref 9–46)
AST: 16 U/L (ref 10–35)
Albumin: 4.2 g/dL (ref 3.6–5.1)
Alkaline phosphatase (APISO): 80 U/L (ref 35–144)
BUN: 15 mg/dL (ref 7–25)
CO2: 28 mmol/L (ref 20–32)
Calcium: 9.2 mg/dL (ref 8.6–10.3)
Chloride: 102 mmol/L (ref 98–110)
Creat: 1.02 mg/dL (ref 0.70–1.25)
GFR, Est African American: 92 mL/min/{1.73_m2} (ref >=60)
GFR, Est Non African American: 79 mL/min/{1.73_m2} (ref >=60)
Globulin: 2.4 g/dL (calc) (ref 1.9–3.7)
Glucose, Bld: 81 mg/dL (ref 65–99)
Potassium: 3.8 mmol/L (ref 3.5–5.3)
Sodium: 139 mmol/L (ref 135–146)
Total Bilirubin: 0.6 mg/dL (ref 0.2–1.2)
Total Protein: 6.6 g/dL (ref 6.1–8.1)

## 2019-06-29 LAB — CBC
HCT: 41.5 % (ref 38.5–50.0)
Hemoglobin: 13.3 g/dL (ref 13.2–17.1)
MCH: 23 pg — ABNORMAL LOW (ref 27.0–33.0)
MCHC: 32 g/dL (ref 32.0–36.0)
MCV: 71.8 fL — ABNORMAL LOW (ref 80.0–100.0)
MPV: 9.5 fL (ref 7.5–12.5)
Platelets: 226 10*3/uL (ref 140–400)
RBC: 5.78 10*6/uL (ref 4.20–5.80)
RDW: 15.9 % — ABNORMAL HIGH (ref 11.0–15.0)
WBC: 7.8 10*3/uL (ref 3.8–10.8)

## 2019-06-29 LAB — PSA, TOTAL WITH REFLEX TO PSA, FREE: PSA, Total: 1.9 ng/mL (ref <=4.0)

## 2019-07-04 ENCOUNTER — Ambulatory Visit (AMBULATORY_SURGERY_CENTER): Payer: Self-pay | Admitting: *Deleted

## 2019-07-04 ENCOUNTER — Other Ambulatory Visit: Payer: Self-pay

## 2019-07-04 VITALS — Temp 97.3°F | Ht 64.0 in | Wt 191.8 lb

## 2019-07-04 DIAGNOSIS — Z86010 Personal history of colonic polyps: Secondary | ICD-10-CM

## 2019-07-04 MED ORDER — SUTAB 1479-225-188 MG PO TABS: 1.0000 | ORAL_TABLET | Freq: Once | ORAL | 0 refills | Status: AC

## 2019-07-04 NOTE — Progress Notes (Signed)
2nd dose of covid vaccine 06-07-19  Pt is aware that care partner will wait in the car during procedure; if they feel like they will be too hot or cold to wait in the car; they may wait in the 4 th floor lobby. Patient is aware to bring only one care partner. We want them to wear a mask (we do not have any that we can provide them), practice social distancing, and we will check their temperatures when they get here.  I did remind the patient that their care partner needs to stay in the parking lot the entire time and have a cell phone available, we will call them when the pt is ready for discharge. Patient will wear mask into building.   No egg or soy allergy  No home oxygen use   No medications for weight loss taken  emmi information given  Pt denies constipation issues   No trouble with anesthesia, difficulty with intubation or hx/fam hx of malignant hyperthermia per pt   Sutab code put into RX and paper copy given to pt to show pharmacy

## 2019-07-06 NOTE — Telephone Encounter (Signed)
Started Orthovisc case and Prior authorization - CF

## 2019-07-10 ENCOUNTER — Telehealth: Payer: Self-pay | Admitting: Gastroenterology

## 2019-07-10 NOTE — Telephone Encounter (Signed)
Patient has not been by the pharmacy yet. Instructed patient to go by his pharmacy and take the coupon he was given in [place]. Pt will do this today. Pt aware the sutab prep should cost $40 with coupon but it must be given to the pharmacy. Pt will call back with any concerns.

## 2019-07-10 NOTE — Telephone Encounter (Signed)
Patient called states ins will not cover prep medication

## 2019-07-12 ENCOUNTER — Other Ambulatory Visit: Payer: Self-pay

## 2019-07-12 DIAGNOSIS — I10 Essential (primary) hypertension: Secondary | ICD-10-CM

## 2019-07-12 MED ORDER — AMLODIPINE BESYLATE 10 MG PO TABS: 10.0000 mg | ORAL_TABLET | Freq: Every day | ORAL | 1 refills | Status: DC

## 2019-07-12 NOTE — Telephone Encounter (Signed)
Justin Frank is here asking about his Orthovisc approval, this was started approximately 6 days ago and requested back on 28 April, any update?

## 2019-07-13 ENCOUNTER — Encounter: Payer: Self-pay | Admitting: Gastroenterology

## 2019-07-13 NOTE — Telephone Encounter (Signed)
Dr. Darene Lamer    I was out of the office yesterday and just received your message I had to fax a PA form to [facility] for [place] orthovisc as of right now I have not received anything back from them as soon as I do I will let you know. - CF

## 2019-07-19 ENCOUNTER — Ambulatory Visit (AMBULATORY_SURGERY_CENTER): Payer: Managed Care, Other (non HMO) | Admitting: Gastroenterology

## 2019-07-19 ENCOUNTER — Encounter: Payer: Self-pay | Admitting: Gastroenterology

## 2019-07-19 ENCOUNTER — Other Ambulatory Visit: Payer: Self-pay

## 2019-07-19 VITALS — BP 119/73 | HR 79 | Temp 98.2°F | Resp 19 | Ht 64.0 in | Wt 191.8 lb

## 2019-07-19 DIAGNOSIS — D124 Benign neoplasm of descending colon: Secondary | ICD-10-CM | POA: Diagnosis not present

## 2019-07-19 DIAGNOSIS — D120 Benign neoplasm of cecum: Secondary | ICD-10-CM

## 2019-07-19 DIAGNOSIS — K621 Rectal polyp: Secondary | ICD-10-CM | POA: Diagnosis not present

## 2019-07-19 DIAGNOSIS — D126 Benign neoplasm of colon, unspecified: Secondary | ICD-10-CM

## 2019-07-19 DIAGNOSIS — Z860100 Personal history of colon polyps, unspecified: Secondary | ICD-10-CM

## 2019-07-19 DIAGNOSIS — K635 Polyp of colon: Secondary | ICD-10-CM | POA: Diagnosis not present

## 2019-07-19 DIAGNOSIS — D125 Benign neoplasm of sigmoid colon: Secondary | ICD-10-CM

## 2019-07-19 DIAGNOSIS — D128 Benign neoplasm of rectum: Secondary | ICD-10-CM

## 2019-07-19 MED ORDER — SODIUM CHLORIDE 0.9 % IV SOLN: 500.0000 mL | Freq: Once | INTRAVENOUS | Status: DC

## 2019-07-19 NOTE — Patient Instructions (Signed)
Please read handouts provided. Continue present medications. Await pathology results.   YOU HAD AN ENDOSCOPIC PROCEDURE TODAY AT THE [facility]:   Refer to the procedure report that was given to you for any specific questions about what was found during the examination.  If the procedure report does not answer your questions, please call your gastroenterologist to clarify.  If you requested that your care partner not be given the details of your procedure findings, then the procedure report has been included in a sealed envelope for you to review at your convenience later.  YOU SHOULD EXPECT: Some feelings of bloating in the abdomen. Passage of more gas than usual.  Walking can help get rid of the air that was put into your GI tract during the procedure and reduce the bloating. If you had a lower endoscopy (such as a colonoscopy or flexible sigmoidoscopy) you may notice spotting of blood in your stool or on the toilet paper. If you underwent a bowel prep for your procedure, you may not have a normal bowel movement for a few days.  Please Note:  You might notice some irritation and congestion in your nose or some drainage.  This is from the oxygen used during your procedure.  There is no need for concern and it should clear up in a day or so.  SYMPTOMS TO REPORT IMMEDIATELY:  Following lower endoscopy (colonoscopy or flexible sigmoidoscopy):  Excessive amounts of blood in the stool  Significant tenderness or worsening of abdominal pains  Swelling of the abdomen that is new, acute  Fever of 100F or higher   For urgent or emergent issues, a gastroenterologist can be reached at any hour by calling [phone]. Do not use MyChart messaging for urgent concerns.    DIET:  We do recommend a small meal at first, but then you may proceed to your regular diet.  Drink plenty of fluids but you should avoid alcoholic beverages for 24 hours.  ACTIVITY:  You should plan to take it easy  for the rest of today and you should NOT DRIVE or use heavy machinery until tomorrow (because of the sedation medicines used during the test).    FOLLOW UP: Our staff will call the number listed on your records 48-72 hours following your procedure to check on you and address any questions or concerns that you may have regarding the information given to you following your procedure. If we do not reach you, we will leave a message.  We will attempt to reach you two times.  During this call, we will ask if you have developed any symptoms of COVID 19. If you develop any symptoms (ie: fever, flu-like symptoms, shortness of breath, cough etc.) before then, please call [phone].  If you test positive for Covid 19 in the 2 weeks post procedure, please call and report this information to us.    If any biopsies were taken you will be contacted by phone or by letter within the next 1-3 weeks.  Please call us at [phone] if you have not heard about the biopsies in 3 weeks.    SIGNATURES/CONFIDENTIALITY: You and/or your care partner have signed paperwork which will be entered into your electronic medical record.  These signatures attest to the fact that that the information above on your After Visit Summary has been reviewed and is understood.  Full responsibility of the confidentiality of this discharge information lies with you and/or your care-partner.  

## 2019-07-19 NOTE — Progress Notes (Signed)
Report given to PACU, vss 

## 2019-07-19 NOTE — Progress Notes (Signed)
Called to room to assist during endoscopic procedure.  Patient ID and intended procedure confirmed with present staff. Received instructions for my participation in the procedure from the performing physician.  

## 2019-07-19 NOTE — Op Note (Signed)
[place] Patient Name: Justin Frank Procedure Date: 07/19/2019 12:39 PM MRN: [id number] Endoscopist: Thornton Park MD, MD Age: 64 Referring MD:  Date of Birth: August 31, 1955 Gender: Male Account #: [id number] Procedure:                Colonoscopy Indications:              Surveillance: Personal history of adenomatous                            polyps on last colonoscopy 3 years ago                           Colonoscopy pathology from [facility] for procedure performed by Dr. Marin Comment                            06/05/2016: Hepatic flexure tubular adenoma, mid                            ascending colon tubular adenoma. Repeat colonoscopy                            recommended in 3 years.                           No known family history of colon cancer or polyps.                           No baseline GI symptoms. Medicines:                Monitored Anesthesia Care Procedure:                Pre-Anesthesia Assessment:                           - Prior to the procedure, a History and Physical                            was performed, and patient medications and                            allergies were reviewed. The patient's tolerance of                            previous anesthesia was also reviewed. The risks                            and benefits of the procedure and the sedation                            options and risks were discussed with the patient.  All questions were answered, and informed consent                            was obtained. Prior Anticoagulants: The patient has                            taken no previous anticoagulant or antiplatelet                            agents. ASA Grade Assessment: II - A patient with                            mild systemic disease. After reviewing the risks                            and benefits, the patient was deemed in     satisfactory condition to undergo the procedure.                           After obtaining informed consent, the colonoscope                            was passed under direct vision. Throughout the                            procedure, the patient's blood pressure, pulse, and                            oxygen saturations were monitored continuously. The                            Colonoscope was introduced through the anus and                            advanced to the 3 cm into the ileum. A second                            forward view of the right colon was performed. The                            colonoscopy was performed without difficulty. The                            patient tolerated the procedure well. The quality                            of the bowel preparation was good. The terminal                            ileum, ileocecal valve, appendiceal orifice, and                            rectum were photographed. Scope In: 1:32:42 PM Scope Out: 1:51:43  PM Scope Withdrawal Time: 0 hours 15 minutes 55 seconds  Total Procedure Duration: 0 hours 19 minutes 1 second  Findings:                 The perianal and digital rectal examinations were                            normal.                           Non-bleeding internal hemorrhoids were found. The                            hemorrhoids were small.                           A less than 1 mm polyp was found in the rectum. The                            polyp was flat. The polyp was removed with a cold                            biopsy forceps. Resection and retrieval were                            complete. Estimated blood loss was minimal.                           A 4 mm polyp was found in the descending colon. The                            polyp was sessile. The polyp was removed with a                            cold snare. Resection and retrieval were complete.                            Estimated blood loss was  minimal.                           A less than 1 mm polyp was found in the hepatic                            flexure. The polyp was flat. The polyp was removed                            with a cold biopsy forceps. Resection and retrieval                            were complete. Estimated blood loss was minimal.                           A 10 mm polyp was found in the cecum. The polyp was  sessile. The polyp was removed with a cold snare.                            Resection and retrieval were complete. Estimated                            blood loss: none.                           The exam was otherwise without abnormality on                            direct and retroflexion views. Complications:            No immediate complications. Estimated blood loss:                            Minimal. Estimated Blood Loss:     Estimated blood loss was minimal. Impression:               - Non-bleeding internal hemorrhoids.                           - One less than 1 mm polyp in the rectum, removed                            with a cold biopsy forceps. Resected and retrieved.                           - One 4 mm polyp in the descending colon, removed                            with a cold snare. Resected and retrieved.                           - One less than 1 mm polyp at the hepatic flexure,                            removed with a cold biopsy forceps. Resected and                            retrieved.                           - One 10 mm polyp in the cecum, removed with a cold                            snare. Resected and retrieved.                           - The examination was otherwise normal on direct                            and retroflexion views. Recommendation:           -  Patient has a contact number available for                            emergencies. The signs and symptoms of potential                            delayed complications were discussed  with the                            patient. Return to normal activities tomorrow.                            Written discharge instructions were provided to the                            patient.                           - Resume previous diet.                           - Continue present medications.                           - Await pathology results.                           - Repeat colonoscopy in 3 years for surveillance if                            at least 3 polyps are adenomatous, or the largest                            polyp is an adenoma.                           - Emerging evidence supports eating a diet of                            fruits, vegetables, grains, calcium, and yogurt                            while reducing red meat and alcohol may reduce the                            risk of colon cancer.                           - Given these results, all first degree relatives                            (brothers, sisters, children, parents) should start                            colon cancer screening at age 21.                           -  Thank you for allowing me to be involved in your                            colon cancer prevention. Thornton Park MD, MD 07/19/2019 1:59:06 PM This report has been signed electronically.

## 2019-07-19 NOTE — Telephone Encounter (Signed)
Received fax from [facility] and they denied coverage on Orthovisc due to patient does not have imaging  To indicate osteoarthritis of the knee. Placing in providers box for review. - CF

## 2019-07-19 NOTE — Progress Notes (Signed)
VS- [facility] [place]  Cell phone off per pt  Pt's states no medical or surgical changes since previsit or office visit.

## 2019-07-20 MED ORDER — EUFLEXXA 20 MG/2ML IX SOSY: 1.0000 | PREFILLED_SYRINGE | INTRA_ARTICULAR | 2 refills | Status: DC

## 2019-07-20 NOTE — Telephone Encounter (Addendum)
Jenny Reichmann looking at the denial it looks like it is not really due to the imaging but more due to Orthovisc not being preferred.  Preferred agents are Durolane, Euflexxa, GELSYN-3, Hyalgan, Supartz.  Switching to Euflexxa and sending to specialty pharmacy.  (In addition technically I have imaged him with ultrasound during the prior injections)

## 2019-07-20 NOTE — Addendum Note (Signed)
Addended by: Silverio Decamp on: 07/20/2019 08:29 AM   Modules accepted: Orders

## 2019-07-21 ENCOUNTER — Encounter: Payer: Self-pay | Admitting: Sports Medicine

## 2019-07-21 ENCOUNTER — Telehealth: Payer: Self-pay | Admitting: *Deleted

## 2019-07-21 ENCOUNTER — Ambulatory Visit (INDEPENDENT_AMBULATORY_CARE_PROVIDER_SITE_OTHER): Payer: Managed Care, Other (non HMO) | Admitting: Sports Medicine

## 2019-07-21 DIAGNOSIS — M1712 Unilateral primary osteoarthritis, left knee: Secondary | ICD-10-CM

## 2019-07-21 NOTE — Assessment & Plan Note (Signed)
Justin Frank returns, he has left knee osteoarthritis, he is done analgesics, NSAIDs, steroid injections, activity modification, continues to have pain, we have been working on getting him approved for viscosupplementation, Orthovisc was not preferred, yesterday I sent in [place], still waiting on delivery of these. He came in but I did advise him that there was really nothing we could do, I am going to no charge this visit. He declines any pain medication to hold him over in the meantime.

## 2019-07-21 NOTE — Telephone Encounter (Signed)
  Follow up Call-  Call back number 07/19/2019  Post procedure Call Back phone  # [phone]  Permission to leave phone message Yes  Some recent data might be hidden     Patient questions:  Do you have a fever, pain , or abdominal swelling? No. Pain Score  0 *  Have you tolerated food without any problems? Yes.    Have you been able to return to your normal activities? Yes.    Do you have any questions about your discharge instructions: Diet   No. Medications  No. Follow up visit  No.  Do you have questions or concerns about your Care? No.  Actions: * If pain score is 4 or above: No action needed, pain <4.  Pt. Stated he was having small amount of bleeding ,denies large amount of bleeding and denies pain,instructed pt. Call call if he sees changes in amount of bleeding or any other s/s.verbalize undestanding.   1. Have you developed a fever since your procedure? no  2.   Have you had an respiratory symptoms (SOB or cough) since your procedure? no  3.   Have you tested positive for COVID 19 since your procedure no  4.   Have you had any family members/close contacts diagnosed with the COVID 19 since your procedure?  no   If yes to any of these questions please route to Joylene John, RN and Erenest Rasher, RN

## 2019-07-21 NOTE — Progress Notes (Signed)
    Procedures performed today:    None.  Independent interpretation of notes and tests performed by another provider:   None.  Brief History, Exam, Impression, and Recommendations:    Primary osteoarthritis of left knee Justin Frank returns, he has left knee osteoarthritis, he is done analgesics, NSAIDs, steroid injections, activity modification, continues to have pain, we have been working on getting him approved for viscosupplementation, Orthovisc was not preferred, yesterday I sent in [place], still waiting on delivery of these. He came in but I did advise him that there was really nothing we could do, I am going to no charge this visit. He declines any pain medication to hold him over in the meantime.    ___________________________________________ Gwen Her. Dianah Field, M.D., ABFM., CAQSM. Primary Care and [place] Instructor of [place] of [facility] of Medicine

## 2019-07-25 ENCOUNTER — Encounter: Payer: Self-pay | Admitting: Gastroenterology

## 2019-08-01 ENCOUNTER — Other Ambulatory Visit: Payer: Self-pay

## 2019-08-01 MED ORDER — OMEPRAZOLE 40 MG PO CPDR: 40.0000 mg | DELAYED_RELEASE_CAPSULE | Freq: Every day | ORAL | 0 refills | Status: DC

## 2019-09-25 ENCOUNTER — Other Ambulatory Visit: Payer: Self-pay

## 2019-09-25 ENCOUNTER — Encounter: Payer: Self-pay | Admitting: Osteopathic Medicine

## 2019-09-25 ENCOUNTER — Ambulatory Visit (INDEPENDENT_AMBULATORY_CARE_PROVIDER_SITE_OTHER): Payer: Managed Care, Other (non HMO) | Admitting: Osteopathic Medicine

## 2019-09-25 VITALS — BP 129/83 | HR 78 | Wt 187.0 lb

## 2019-09-25 DIAGNOSIS — E1169 Type 2 diabetes mellitus with other specified complication: Secondary | ICD-10-CM

## 2019-09-25 DIAGNOSIS — E119 Type 2 diabetes mellitus without complications: Secondary | ICD-10-CM

## 2019-09-25 DIAGNOSIS — I10 Essential (primary) hypertension: Secondary | ICD-10-CM

## 2019-09-25 DIAGNOSIS — E785 Hyperlipidemia, unspecified: Secondary | ICD-10-CM | POA: Diagnosis not present

## 2019-09-25 LAB — POCT GLYCOSYLATED HEMOGLOBIN (HGB A1C)
HbA1c POC (<> result, manual entry): 5.9 % (ref 4.0–5.6)
HbA1c, POC (controlled diabetic range): 5.9 % (ref 0.0–7.0)
HbA1c, POC (prediabetic range): 5.9 % (ref 5.7–6.4)
Hemoglobin A1C: 5.9 % — AB (ref 4.0–5.6)

## 2019-09-25 MED ORDER — AMLODIPINE BESYLATE 10 MG PO TABS: 10.0000 mg | ORAL_TABLET | Freq: Every day | ORAL | 0 refills | Status: DC

## 2019-09-25 MED ORDER — AMLODIPINE BESYLATE 10 MG PO TABS: 10.0000 mg | ORAL_TABLET | Freq: Every day | ORAL | 3 refills | Status: DC

## 2019-09-25 MED ORDER — METFORMIN HCL ER 500 MG PO TB24: ORAL_TABLET | ORAL | 3 refills | Status: DC

## 2019-09-25 MED ORDER — CETIRIZINE HCL 10 MG PO TABS: 10.0000 mg | ORAL_TABLET | Freq: Every day | ORAL | 3 refills | Status: DC

## 2019-09-25 MED ORDER — OMEPRAZOLE 40 MG PO CPDR: 40.0000 mg | DELAYED_RELEASE_CAPSULE | Freq: Every day | ORAL | 3 refills | Status: DC

## 2019-09-25 MED ORDER — ATORVASTATIN CALCIUM 20 MG PO TABS: 20.0000 mg | ORAL_TABLET | Freq: Every day | ORAL | 3 refills | Status: DC

## 2019-09-25 NOTE — Progress Notes (Signed)
HPI: Justin Frank is a 64 y.o. male who  has a past medical history of Allergy, Asthma, Controlled type 2 diabetes mellitus without complication, without long-term current use of insulin ([place]) (03/22/2017), ED (erectile dysfunction), GERD (gastroesophageal reflux disease), Hyperlipidemia, and Hypertension.  he presents to [facility] today, 09/25/19,  for chief complaint of:  DM2 HTN  Doing really well overall! No major problems lately. Has been successful w/ intentional weight loss. He's managing well w/ current medications. A1C 12/21/18 was 6.1, 03/22/19 was 5.9, last visit 06/26/19 was 6.0 --> increased Ozempic, today 09/25/19 is 5.9  BP Readings :  06/26/19  134/86  03/22/19 133/87  02/21/19 134/86  02/17/19 129/75  02/06/19 134/74   Wt Readings:  09/25/19 187 lb  06/26/19  194 lb  03/22/19  195 lb  02/17/19 196 lb (88.9 kg)  02/06/19 197 lb (89.4 kg)  12/21/18 195 lb (88.5 kg)      At today's visit 09/25/19 ... PMH, PSH, FH reviewed and updated as needed.  Current medication list and allergy/intolerance hx reviewed and updated as needed. (See remainder of HPI, ROS, Phys Exam below)   No results found.  No results found for this or any previous visit (from the past 72 hour(s)).        ASSESSMENT/PLAN: The primary encounter diagnosis was Controlled type 2 diabetes mellitus without complication, without long-term current use of insulin ([place]). Diagnoses of Hyperlipidemia associated with type 2 diabetes mellitus ([place]) and Essential hypertension were also pertinent to this visit.     Orders Placed This Encounter  Procedures  . POCT HgB A1C     Meds ordered this encounter  Medications  . cetirizine (ZYRTEC) 10 MG tablet    Sig: Take 1 tablet (10 mg total) by mouth daily.    Dispense:  90 tablet    Refill:  3  . atorvastatin (LIPITOR) 20 MG tablet    Sig: Take 1 tablet (20 mg total) by mouth daily.    Dispense:  90  tablet    Refill:  3  . amLODipine (NORVASC) 10 MG tablet    Sig: Take 1 tablet (10 mg total) by mouth daily.    Dispense:  90 tablet    Refill:  3  . metFORMIN (GLUCOPHAGE-XR) 500 MG 24 hr tablet    Sig: TAKE 1 TABLET(500 MG) BY MOUTH DAILY WITH BREAKFAST    Dispense:  90 tablet    Refill:  3  . omeprazole (PRILOSEC) 40 MG capsule    Sig: Take 1 capsule (40 mg total) by mouth daily.    Dispense:  90 capsule    Refill:  3        Follow-up plan: Return in about 4 months (around 01/26/2020) for ANNUAL (call week prior to visit for lab orders).                                                 ################################################# ################################################# ################################################# #################################################    Current Meds  Medication Sig  . amLODipine (NORVASC) 10 MG tablet Take 1 tablet (10 mg total) by mouth daily.  Marland Kitchen atorvastatin (LIPITOR) 20 MG tablet Take 1 tablet (20 mg total) by mouth daily.  . cetirizine (ZYRTEC) 10 MG tablet Take 1 tablet (10 mg total) by mouth daily.  . fluticasone (FLONASE) 50 MCG/ACT nasal spray  Place 1 spray in each nostril once daily.  . hydrOXYzine (ATARAX/VISTARIL) 25 MG tablet Take 1 tablet (25 mg total) by mouth every 6 (six) hours as needed for anxiety (sleep).  Marland Kitchen linaclotide (LINZESS) 145 MCG CAPS capsule Take 1 capsule (145 mcg total) by mouth daily before breakfast. (Patient taking differently: Take 145 mcg by mouth daily before breakfast. PRN)  . metFORMIN (GLUCOPHAGE-XR) 500 MG 24 hr tablet TAKE 1 TABLET(500 MG) BY MOUTH DAILY WITH BREAKFAST  . omeprazole (PRILOSEC) 40 MG capsule Take 1 capsule (40 mg total) by mouth daily.  . Semaglutide, 1 MG/DOSE, (OZEMPIC, 1 MG/DOSE,) 2 MG/1.5ML SOPN Inject 1 mg into the skin once a week.  . valsartan (DIOVAN) 160 MG tablet TAKE 1 TABLET(160 MG) BY MOUTH DAILY     Allergies  Allergen Reactions  . Atorvastatin Swelling    " lip swelling "    . Lisinopril Cough    Other reaction(s): Cough (ALLERGY/intolerance)     Exam:  BP (!) 129/83 (BP Location: Left Arm, Patient Position: Sitting)   Pulse 78   Wt 187 lb (84.8 kg)   SpO2 99%   BMI 32.10 kg/m   Constitutional: VS see above. General Appearance: alert, well-developed, well-nourished, NAD  Eyes: Normal lids and conjunctive, non-icteric sclera  Ears, Nose, Mouth, Throat: MMM, Normal external inspection ears/nares/mouth/lips/gums.  Neck: No masses, trachea midline.   Respiratory: Normal respiratory effort. no wheeze, no rhonchi, no rales  Cardiovascular: S1/S2 normal, no murmur, no rub/gallop auscultated. RRR.   Musculoskeletal: Gait normal. Symmetric and independent movement of all extremities. Normal strength to hip flex/ext, knee flex/ext, no midline spinal tenderness,, normal sensation L and R thigh symmetrically. Neg SLR bilaterally.   Neurological: Normal balance/coordination. No tremor.  Skin: warm, dry, intact.   Psychiatric: Normal judgment/insight. Normal mood and affect. Oriented x3.       Visit summary with medication list and pertinent instructions was printed for patient to review, patient was advised to alert [place] if any updates are needed. All questions at time of visit were answered - patient instructed to contact office with any additional concerns. ER/RTC precautions were reviewed with the patient and understanding verbalized.     Please note: voice recognition software was used to produce this document, and typos may escape review. Please contact Dr. Sheppard Coil for any needed clarifications.    Follow up plan: Return in about 4 months (around 01/26/2020) for [place] (call week prior to visit for lab orders).

## 2019-10-19 ENCOUNTER — Other Ambulatory Visit: Payer: Self-pay | Admitting: Osteopathic Medicine

## 2019-10-31 LAB — HM DIABETES EYE EXAM

## 2019-11-20 ENCOUNTER — Encounter: Payer: Self-pay | Admitting: Osteopathic Medicine

## 2019-11-20 ENCOUNTER — Telehealth (INDEPENDENT_AMBULATORY_CARE_PROVIDER_SITE_OTHER): Payer: Managed Care, Other (non HMO) | Admitting: Osteopathic Medicine

## 2019-11-20 DIAGNOSIS — J069 Acute upper respiratory infection, unspecified: Secondary | ICD-10-CM

## 2019-11-20 DIAGNOSIS — Z20822 Contact with and (suspected) exposure to covid-19: Secondary | ICD-10-CM

## 2019-11-20 MED ORDER — PREDNISONE 20 MG PO TABS: 20.0000 mg | ORAL_TABLET | Freq: Two times a day (BID) | ORAL | 0 refills | Status: DC

## 2019-11-20 MED ORDER — GUAIFENESIN-CODEINE 100-10 MG/5ML PO SYRP: 5.0000 mL | ORAL_SOLUTION | Freq: Four times a day (QID) | ORAL | 0 refills | Status: DC | PRN

## 2019-11-20 MED ORDER — IPRATROPIUM BROMIDE 0.06 % NA SOLN: 2.0000 | Freq: Four times a day (QID) | NASAL | 1 refills | Status: DC

## 2019-11-20 NOTE — Patient Instructions (Signed)
Upper respiratory symptoms: . We are seeing vaccinated people catching COVID. Thankfully, these infections tend to be mild, but your symptoms can possibly be due to COVID. Please get tested, please isolate until you have your results back.  . I suspect a viral illness of some kind. There is no way to fix this quickly, but there are medications we can use to treat symptom while your body fights off the infection. See below. OK TO COMBINE MEDS AS DIRECTED BELOW!      Medications & Home Remedies for Upper Respiratory Illness   Aches/Pains, Fever, Headache OTC Acetaminophen (Tylenol) 500 mg tablets - take max 2 tablets (1000 mg) every 6 hours (4 times per day)  OTC Ibuprofen (Motrin) 200 mg tablets - take max 4 tablets (800 mg) every 6 hours   Sinus Congestion OTC Nasal Saline if desired to rinse Prescription Atrovent nasal spray to help dry sinuses  OTC Oxymetolazone (Afrin, others) sparing use due to rebound congestion, NEVER use in kids OTC Phenylephrine (Sudafed) 10 mg tablets every 4 hours (or the 12-hour formulation)* OTC Diphenhydramine (Benadryl) 25 mg tablets - take max 2 tablets every 4 hours   Cough & Sore Throat OTC Dextromethorphan (Robitussin, others) - cough suppressant OTC Guaifenesin (Robitussin, Mucinex, others) - expectorant (helps cough up mucus) Prescription Guaifenesin-Codeine to help cough, do NOT combine with extra Guaifenesin as above, ok to take with Dextromethorphan   OTC Lozenges w/ Benzocaine + Menthol (Cepacol) Honey - as much as you want! Teas which "coat the throat" - look for ingredients Elm Bark, Licorice Root, Marshmallow Root   Other Prescription Oral Steroids to decrease inflammation and improve energy OTC Zinc Lozenges within 24 hours of symptoms onset - mixed evidence this shortens the duration of the common cold Don't waste your money on Vitamin C or Echinacea in acute illness - it's already too late!

## 2019-11-20 NOTE — Progress Notes (Signed)
Virtual Visit via Video (App used: MyChart video) Note  I connected with      Justin Frank on 11/20/19 at 4:14 PM  by a telemedicine application and verified that I am speaking with the correct person using two identifiers.  Patient is at home I am in office   I discussed the limitations of evaluation and management by telemedicine and the availability of in person appointments. The patient expressed understanding and agreed to proceed.  History of Present Illness: Justin Frank is a 64 y.o. male who would like to discuss Cough, URI symptoms  Patient reports 2-3 days of dry cough, sneezing, nasal congestion, sore throat, and sinus headache. He denies fever, chills, fatigue, malaise, chest pain, SOB, abdominal pain, nausea, vomiting, difficulty swallowing, appetite changes. His temp at home was 96.37F. He denies known sick contacts. He has not tried OTC medications as he says they typically do not work. He has used ginger tea and halls lozenges with mild improvement. He has been vaccinated for COVID and is not aware of any contacts that have [place].     Observations/Objective: There were no vitals taken for this visit. BP Readings from Last 3 Encounters:  09/25/19 (!) 129/83  07/19/19 119/73  06/26/19 134/86   Exam: Normal Speech.  NAD.  Lab and Radiology Results No results found for this or any previous visit (from the past 72 hour(s)). No results found.     Assessment and Plan: 64 y.o. male with The primary encounter diagnosis was Suspected COVID-19 virus infection. A diagnosis of Viral URI with cough was also pertinent to this visit.   Viral Upper Respiratory IIlness - Symptoms concerning for COVID 19. Requested patient come to office for drive through COVID testing. Quarantine at home until results come back. If positive, will need to quarantine for 10 days. If negative, can return to work when symptoms resolve. - Sent prescriptions for prednisone burst,  ipratropium nasal spray, and robitussin with codeine. Also gave a list of OTC medications to use. - Follow up if symptoms worsen or do not improve.   PDMP not reviewed this encounter. Orders Placed This Encounter  Procedures  . Novel Coronavirus, NAA ([facility])    Order Specific Question:   Is this test for diagnosis or screening    Answer:   Diagnosis of ill patient    Order Specific Question:   Symptomatic for COVID-19 as defined by CDC    Answer:   Yes    Order Specific Question:   Date of Symptom Onset    Answer:   11/17/2019    Order Specific Question:   Hospitalized for COVID-19    Answer:   No    Order Specific Question:   Admitted to ICU for COVID-19    Answer:   No    Order Specific Question:   Previously tested for COVID-19    Answer:   No    Order Specific Question:   Resident in a congregate (group) care setting    Answer:   No    Order Specific Question:   Is the patient student?    Answer:   No    Order Specific Question:   Employed in healthcare setting    Answer:   No    Order Specific Question:   Has patient completed COVID vaccination(s) (2 doses of Pfizer/Moderna 1 dose of [place])    Answer:   Yes   Meds ordered this encounter  Medications  . predniSONE (  DELTASONE) 20 MG tablet    Sig: Take 1 tablet (20 mg total) by mouth 2 (two) times daily with a meal.    Dispense:  10 tablet    Refill:  0  . ipratropium (ATROVENT) 0.06 % nasal spray    Sig: Place 2 sprays into both nostrils 4 (four) times daily. As needed for sinus congestion    Dispense:  15 mL    Refill:  1  . guaiFENesin-codeine (ROBITUSSIN AC) 100-10 MG/5ML syrup    Sig: Take 5-10 mLs by mouth 4 (four) times daily as needed for cough.    Dispense:  180 mL    Refill:  0   Patient Instructions  Upper respiratory symptoms: . We are seeing vaccinated people catching COVID. Thankfully, these infections tend to be mild, but your symptoms can possibly be due to COVID. Please get tested, please  isolate until you have your results back.  . I suspect a viral illness of some kind. There is no way to fix this quickly, but there are medications we can use to treat symptom while your body fights off the infection. See below. OK TO COMBINE MEDS AS DIRECTED BELOW!      Medications & Home Remedies for Upper Respiratory Illness   Aches/Pains, Fever, Headache OTC Acetaminophen (Tylenol) 500 mg tablets - take max 2 tablets (1000 mg) every 6 hours (4 times per day)  OTC Ibuprofen (Motrin) 200 mg tablets - take max 4 tablets (800 mg) every 6 hours   Sinus Congestion OTC Nasal Saline if desired to rinse Prescription Atrovent nasal spray to help dry sinuses  OTC Oxymetolazone (Afrin, others) sparing use due to rebound congestion, NEVER use in kids OTC Phenylephrine (Sudafed) 10 mg tablets every 4 hours (or the 12-hour formulation)* OTC Diphenhydramine (Benadryl) 25 mg tablets - take max 2 tablets every 4 hours   Cough & Sore Throat OTC Dextromethorphan (Robitussin, others) - cough suppressant OTC Guaifenesin (Robitussin, Mucinex, others) - expectorant (helps cough up mucus) Prescription Guaifenesin-Codeine to help cough, do NOT combine with extra Guaifenesin as above, ok to take with Dextromethorphan   OTC Lozenges w/ Benzocaine + Menthol (Cepacol) Honey - as much as you want! Teas which "coat the throat" - look for ingredients Elm Bark, Licorice Root, Marshmallow Root   Other Prescription Oral Steroids to decrease inflammation and improve energy OTC Zinc Lozenges within 24 hours of symptoms onset - mixed evidence this shortens the duration of the common cold Don't waste your money on Vitamin C or Echinacea in acute illness - it's already too late!          Instructions sent via MyChart. If MyChart not available, pt was given option for info via personal e-mail w/ no guarantee of protected health info over unsecured e-mail communication, and MyChart sign-up instructions were sent to  patient.   Follow Up Instructions: Return if symptoms worsen or fail to improve.    I discussed the assessment and treatment plan with the patient. The patient was provided an opportunity to ask questions and all were answered. The patient agreed with the plan and demonstrated an understanding of the instructions.   The patient was advised to call back or seek an in-person evaluation if any new concerns, if symptoms worsen or if the condition fails to improve as anticipated.  20 minutes of non-face-to-face time was provided during this encounter.      . . . . . . . . . . . . . Marland Kitchen  Historical information moved to improve visibility of documentation.  Past Medical History:  Diagnosis Date  . Allergy   . Asthma   . Controlled type 2 diabetes mellitus without complication, without long-term current use of insulin ([place]) 03/22/2017  . ED (erectile dysfunction)   . GERD (gastroesophageal reflux disease)   . Hyperlipidemia   . Hypertension    Past Surgical History:  Procedure Laterality Date  . COLONOSCOPY    . HERNIA REPAIR     As per patient - 2012, 2013, 2015   Social History   Tobacco Use  . Smoking status: Former Smoker    Quit date: 03/02/1977    Years since quitting: 42.7  . Smokeless tobacco: Never Used  Substance Use Topics  . Alcohol use: No   family history includes Hypertension in his father and mother.  Medications: Current Outpatient Medications  Medication Sig Dispense Refill  . amLODipine (NORVASC) 10 MG tablet Take 1 tablet (10 mg total) by mouth daily. 90 tablet 3  . atorvastatin (LIPITOR) 20 MG tablet Take 1 tablet (20 mg total) by mouth daily. 90 tablet 3  . cetirizine (ZYRTEC) 10 MG tablet Take 1 tablet (10 mg total) by mouth daily. 90 tablet 3  . fluticasone (FLONASE) 50 MCG/ACT nasal spray Place 1 spray in each nostril once daily. 16 g 11  . hydrOXYzine (ATARAX/VISTARIL) 25 MG tablet Take 1 tablet (25 mg  total) by mouth every 6 (six) hours as needed for anxiety (sleep). 12 tablet 0  . linaclotide (LINZESS) 145 MCG CAPS capsule Take 1 capsule (145 mcg total) by mouth daily before breakfast. (Patient taking differently: Take 145 mcg by mouth daily before breakfast. PRN) 90 capsule 1  . metFORMIN (GLUCOPHAGE-XR) 500 MG 24 hr tablet TAKE 1 TABLET(500 MG) BY MOUTH DAILY WITH BREAKFAST 90 tablet 3  . omeprazole (PRILOSEC) 40 MG capsule Take 1 capsule (40 mg total) by mouth daily. 90 capsule 3  . Semaglutide, 1 MG/DOSE, (OZEMPIC, 1 MG/DOSE,) 2 MG/1.5ML SOPN Inject 1 mg into the skin once a week. 6 pen 11  . Sodium Hyaluronate (EUFLEXXA) 20 MG/2ML SOSY Inject 1 each into the articular space once a week. x3 weeks.  Dx Knee osteoarthritis, patient will bring syringes to me and I will inject 6 mL 2  . valsartan (DIOVAN) 160 MG tablet TAKE 1 TABLET(160 MG) BY MOUTH DAILY 90 tablet 3  . guaiFENesin-codeine (ROBITUSSIN AC) 100-10 MG/5ML syrup Take 5-10 mLs by mouth 4 (four) times daily as needed for cough. 180 mL 0  . ipratropium (ATROVENT) 0.06 % nasal spray Place 2 sprays into both nostrils 4 (four) times daily. As needed for sinus congestion 15 mL 1  . predniSONE (DELTASONE) 20 MG tablet Take 1 tablet (20 mg total) by mouth 2 (two) times daily with a meal. 10 tablet 0   No current facility-administered medications for this visit.   Allergies  Allergen Reactions  . Atorvastatin Swelling    " lip swelling "    . Lisinopril Cough    Other reaction(s): Cough (ALLERGY/intolerance)                                                                     Gweneth Dimitri, [facility] MS3

## 2019-11-22 LAB — SPECIMEN STATUS REPORT

## 2019-11-22 LAB — NOVEL CORONAVIRUS, NAA: SARS-CoV-2, NAA: NOT DETECTED

## 2019-11-22 LAB — SARS-COV-2, NAA 2 DAY TAT

## 2019-11-24 ENCOUNTER — Telehealth: Payer: Self-pay

## 2019-11-24 ENCOUNTER — Other Ambulatory Visit: Payer: Self-pay | Admitting: Osteopathic Medicine

## 2019-11-24 ENCOUNTER — Other Ambulatory Visit: Payer: Self-pay | Admitting: Medical-Surgical

## 2019-11-24 DIAGNOSIS — I10 Essential (primary) hypertension: Secondary | ICD-10-CM

## 2019-11-24 MED ORDER — AZITHROMYCIN 250 MG PO TABS
ORAL_TABLET | ORAL | 0 refills | Status: DC
Start: 2019-11-24 — End: 2020-01-24

## 2019-11-24 NOTE — Telephone Encounter (Signed)
Patient was seen 11/20/2019 for cough, and congestion. His covid test was negative and was given guaiFENesin-codeine. He says that he still has a bad cough and the only thing that will work is the z pak regiment. He wants to know if this can be called in for him. Please advise

## 2019-11-24 NOTE — Telephone Encounter (Signed)
Justin Frank reports he still has a cough soreness in chest due to cough. He had a visit with Dr Sheppard Coil and had a negative Covid test. He states he has had this in the past and the only thing that made him better was a Zpack. He is requesting a Zpack. Denies fever, chills or sweats. Please advise.

## 2019-11-24 NOTE — Telephone Encounter (Signed)
PATIENT NOTIFIED OF rx SENT TO PHARMACY

## 2019-11-24 NOTE — Telephone Encounter (Signed)
It looks like it was sent earlier today.  I see that he does have a diagnosis of asthma as well.  So hopefully the prednisone is helping.

## 2019-11-27 ENCOUNTER — Other Ambulatory Visit: Payer: Self-pay | Admitting: Medical-Surgical

## 2019-11-27 MED ORDER — GUAIFENESIN-CODEINE 100-10 MG/5ML PO SYRP: 5.0000 mL | ORAL_SOLUTION | Freq: Four times a day (QID) | ORAL | 0 refills | Status: DC | PRN

## 2019-11-27 NOTE — Telephone Encounter (Signed)
Please advise, he says the z pack is not working.

## 2019-12-04 ENCOUNTER — Other Ambulatory Visit: Payer: Self-pay | Admitting: Osteopathic Medicine

## 2019-12-24 ENCOUNTER — Other Ambulatory Visit: Payer: Self-pay | Admitting: Osteopathic Medicine

## 2020-01-12 ENCOUNTER — Other Ambulatory Visit: Payer: Self-pay

## 2020-01-12 DIAGNOSIS — E119 Type 2 diabetes mellitus without complications: Secondary | ICD-10-CM

## 2020-01-12 DIAGNOSIS — I10 Essential (primary) hypertension: Secondary | ICD-10-CM

## 2020-01-12 MED ORDER — VALSARTAN 160 MG PO TABS: ORAL_TABLET | ORAL | 1 refills | Status: DC

## 2020-01-19 ENCOUNTER — Ambulatory Visit (INDEPENDENT_AMBULATORY_CARE_PROVIDER_SITE_OTHER): Payer: Managed Care, Other (non HMO) | Admitting: Sports Medicine

## 2020-01-19 ENCOUNTER — Other Ambulatory Visit: Payer: Self-pay

## 2020-01-19 DIAGNOSIS — M5416 Radiculopathy, lumbar region: Secondary | ICD-10-CM

## 2020-01-19 NOTE — Progress Notes (Signed)
    Procedures performed today:    None.  Independent interpretation of notes and tests performed by another provider:   None.  Brief History, Exam, Impression, and Recommendations:    Left lumbar radiculitis Justin Frank returns, he is a very pleasant 64 year old male, earlier in the year (March 2021) we treated him for a left lumbar radiculitis, ultimately he needed to left L4-L5 interlaminar epidurals and ultimately reported 80% pain relief and happy with how things were going. Now starting to have a recurrence of discomfort and desires repeat interventional treatment, I am going to order the first of potentially 3 left L4-L5 intralaminar epidurals with [facility], he can call me back a month later to let me know if he needs a second.    ___________________________________________ Gwen Her. Dianah Field, M.D., ABFM., CAQSM. Primary Care and [place] Instructor of [employer] [facility] [employer] [facility] [employer]

## 2020-01-19 NOTE — Progress Notes (Signed)
Left message on VM for Justin Frank at [place] with patient info to schedule epidural.

## 2020-01-19 NOTE — Assessment & Plan Note (Signed)
Justin Frank returns, he is a very pleasant 64 year old male, earlier in the year (March 2021) we treated him for a left lumbar radiculitis, ultimately he needed to left L4-L5 interlaminar epidurals and ultimately reported 80% pain relief and happy with how things were going. Now starting to have a recurrence of discomfort and desires repeat interventional treatment, I am going to order the first of potentially 3 left L4-L5 intralaminar epidurals with [facility] imaging, he can call me back a month later to let me know if he needs a second.

## 2020-01-22 ENCOUNTER — Other Ambulatory Visit: Payer: Self-pay | Admitting: Osteopathic Medicine

## 2020-01-22 DIAGNOSIS — I10 Essential (primary) hypertension: Secondary | ICD-10-CM

## 2020-01-24 ENCOUNTER — Other Ambulatory Visit: Payer: Self-pay

## 2020-01-24 ENCOUNTER — Ambulatory Visit (INDEPENDENT_AMBULATORY_CARE_PROVIDER_SITE_OTHER): Payer: Managed Care, Other (non HMO) | Admitting: Osteopathic Medicine

## 2020-01-24 VITALS — BP 122/79 | HR 80 | Ht 64.0 in | Wt 180.0 lb

## 2020-01-24 DIAGNOSIS — I10 Essential (primary) hypertension: Secondary | ICD-10-CM | POA: Diagnosis not present

## 2020-01-24 DIAGNOSIS — Z125 Encounter for screening for malignant neoplasm of prostate: Secondary | ICD-10-CM | POA: Diagnosis not present

## 2020-01-24 DIAGNOSIS — Z23 Encounter for immunization: Secondary | ICD-10-CM

## 2020-01-24 DIAGNOSIS — J4520 Mild intermittent asthma, uncomplicated: Secondary | ICD-10-CM

## 2020-01-24 DIAGNOSIS — E119 Type 2 diabetes mellitus without complications: Secondary | ICD-10-CM

## 2020-01-24 DIAGNOSIS — Z0000 Encounter for general adult medical examination without abnormal findings: Secondary | ICD-10-CM

## 2020-01-24 LAB — POCT GLYCOSYLATED HEMOGLOBIN (HGB A1C): Hemoglobin A1C: 5.6 % (ref 4.0–5.6)

## 2020-01-24 MED ORDER — HYDROXYZINE HCL 25 MG PO TABS
ORAL_TABLET | ORAL | 1 refills | Status: DC
Start: 2020-01-24 — End: 2022-08-24

## 2020-01-24 MED ORDER — CETIRIZINE HCL 10 MG PO TABS
10.0000 mg | ORAL_TABLET | Freq: Every day | ORAL | 3 refills | Status: DC
Start: 2020-01-24 — End: 2020-06-13

## 2020-01-24 NOTE — Patient Instructions (Addendum)
Orders are in for future lab work! Please get fasting blood drawn at least 2 days before your appointment.  (No food or anything to drink besides water or black coffee, 8 hours prior to blood draw. Take all medications as usual).   Lab is in room 135 in the [facility] building. Open 7-5 weekdays, no appointment needed.  -Dr Loni Muse

## 2020-01-24 NOTE — Progress Notes (Signed)
HPI: Justin Frank is a 64 y.o. male who  has a past medical history of Allergy, Asthma, Controlled type 2 diabetes mellitus without complication, without long-term current use of insulin ([place]) (03/22/2017), ED (erectile dysfunction), GERD (gastroesophageal reflux disease), Hyperlipidemia, and Hypertension.  he presents to [facility] today, 01/24/20,  for chief complaint of:  DM2 HTN  Doing really well overall! No major problems lately. Has been successful w/ intentional weight loss. He's managing well w/ current medications. A1C 12/21/18 was 6.1, 03/22/19 was 5.9, last visit 06/26/19 was 6.0 --> increased Ozempic, 09/25/19 was 5.9 and today 01/24/20 5.6  BP Readings :  01/24/20  122/79  06/26/19  134/86  03/22/19 133/87  02/21/19 134/86  02/17/19 129/75  02/06/19 134/74   Wt Readings:  01/24/20  180 lb  09/25/19 187 lb  06/26/19  194 lb  03/22/19  195 lb  02/17/19 196 lb (88.9 kg)  02/06/19 197 lb (89.4 kg)  12/21/18 195 lb (88.5 kg)      At today's visit 01/24/20 ... PMH, PSH, FH reviewed and updated as needed.  Current medication list and allergy/intolerance hx reviewed and updated as needed. (See remainder of HPI, ROS, Phys Exam below)   No results found.  Results for orders placed or performed in visit on 01/24/20 (from the past 72 hour(s))  POCT glycosylated hemoglobin (Hb A1C)     Status: Normal   Collection Time: 01/24/20  9:04 AM  Result Value Ref Range   Hemoglobin A1C 5.6 4.0 - 5.6 %   HbA1c POC (<> result, manual entry)     HbA1c, POC (prediabetic range)     HbA1c, POC (controlled diabetic range)            ASSESSMENT/PLAN: The primary encounter diagnosis was Controlled type 2 diabetes mellitus without complication, without long-term current use of insulin ([place]). A diagnosis of Flu vaccine need was also pertinent to this visit.   1. Controlled type 2 diabetes mellitus without complication, without long-term  current use of insulin ([place]) Doing well continue current Rx  2. Flu vaccine need done  3. Essential hypertension Controlled, stable  4. Prostate cancer screening Labs next visit   5. Mild intermittent asthma without complication No SOB/cough  6. GI upset - mild Notes more gassiness lately, concern for lactose intolernace Recommended look at low FODMAP diet and see if there are triggers on this list. Avoid lactose or supplement with lactase.   7. Annual physical exam Labs ordered for future visit. Annual physical / preventive care was NOT performed or billed today.     Orders Placed This Encounter  Procedures  . Flu Vaccine QUAD 36+ mos IM  . POCT glycosylated hemoglobin (Hb A1C)     Meds ordered this encounter  Medications  . cetirizine (ZYRTEC) 10 MG tablet    Sig: Take 1 tablet (10 mg total) by mouth daily.    Dispense:  90 tablet    Refill:  3  . hydrOXYzine (ATARAX/VISTARIL) 25 MG tablet    Sig: TAKE 1 TABLET(25 MG) BY MOUTH EVERY 6 HOURS AS NEEDED FOR ANXIETY OR SLEEP    Dispense:  90 tablet    Refill:  1        Follow-up plan: Return in about 4 months (around 05/23/2020) for ANNUAL (labs 2+ days prior to appt, orders are in).                                                 ################################################# ################################################# ################################################# #################################################  Current Meds  Medication Sig  . amLODipine (NORVASC) 10 MG tablet TAKE 1 TABLET(10 MG) BY MOUTH DAILY  . atorvastatin (LIPITOR) 20 MG tablet Take 1 tablet (20 mg total) by mouth daily.  . cetirizine (ZYRTEC) 10 MG tablet Take 1 tablet (10 mg total) by mouth daily.  . fluticasone (FLONASE) 50 MCG/ACT nasal spray Place 1 spray in each nostril once daily.  . hydrOXYzine (ATARAX/VISTARIL) 25 MG tablet TAKE 1 TABLET(25 MG) BY MOUTH EVERY 6  HOURS AS NEEDED FOR ANXIETY OR SLEEP  . linaclotide (LINZESS) 145 MCG CAPS capsule Take 1 capsule (145 mcg total) by mouth daily before breakfast. (Patient taking differently: Take 145 mcg by mouth daily before breakfast. PRN)  . metFORMIN (GLUCOPHAGE-XR) 500 MG 24 hr tablet TAKE 1 TABLET(500 MG) BY MOUTH DAILY WITH BREAKFAST  . omeprazole (PRILOSEC) 40 MG capsule Take 1 capsule (40 mg total) by mouth daily.  . Semaglutide, 1 MG/DOSE, (OZEMPIC, 1 MG/DOSE,) 2 MG/1.5ML SOPN Inject 1 mg into the skin once a week.  . valsartan (DIOVAN) 160 MG tablet TAKE 1 TABLET(160 MG) BY MOUTH DAILY  . [DISCONTINUED] cetirizine (ZYRTEC) 10 MG tablet Take 1 tablet (10 mg total) by mouth daily.  . [DISCONTINUED] hydrOXYzine (ATARAX/VISTARIL) 25 MG tablet TAKE 1 TABLET(25 MG) BY MOUTH EVERY 6 HOURS AS NEEDED FOR ANXIETY OR SLEEP    Allergies  Allergen Reactions  . Atorvastatin Swelling    " lip swelling "    . Lisinopril Cough    Other reaction(s): Cough (ALLERGY/intolerance)     Exam:  BP 122/79   Pulse 80   Ht 5\' 4"  (1.626 m)   Wt 180 lb (81.6 kg)   SpO2 100%   BMI 30.90 kg/m   Constitutional: VS see above. General Appearance: alert, well-developed, well-nourished, NAD  Neck: No masses, trachea midline.   Respiratory: Normal respiratory effort. no wheeze, no rhonchi, no rales  Cardiovascular: S1/S2 normal, no murmur, no rub/gallop auscultated. RRR.    Neurological: Normal balance/coordination. No tremor.  Psychiatric: Normal judgment/insight. Normal mood and affect. Oriented x3.       Visit summary with medication list and pertinent instructions was printed for patient to review, patient was advised to alert [place] if any updates are needed. All questions at time of visit were answered - patient instructed to contact office with any additional concerns. ER/RTC precautions were reviewed with the patient and understanding verbalized.     Please note: voice recognition software was used to  produce this document, and typos may escape review. Please contact Dr. Sheppard Coil for any needed clarifications.    Follow up plan: Return in about 4 months (around 05/23/2020) for ANNUAL (labs 2+ days prior to appt, orders are in).

## 2020-02-01 ENCOUNTER — Ambulatory Visit
Admission: RE | Admit: 2020-02-01 | Discharge: 2020-02-01 | Disposition: A | Discharge status: Home / Self Care | Payer: Managed Care, Other (non HMO) | Source: Ambulatory Visit | Attending: Sports Medicine | Admitting: Sports Medicine

## 2020-02-01 ENCOUNTER — Other Ambulatory Visit: Payer: Self-pay

## 2020-02-01 DIAGNOSIS — M5416 Radiculopathy, lumbar region: Secondary | ICD-10-CM

## 2020-02-01 MED ORDER — IOPAMIDOL (ISOVUE-M 200) INJECTION 41%
1.0000 mL | Freq: Once | INTRAMUSCULAR | Status: AC
  Administered 2020-02-01: 1 mL via EPIDURAL

## 2020-02-01 MED ORDER — METHYLPREDNISOLONE ACETATE 40 MG/ML INJ SUSP (RADIOLOG
120.0000 mg | Freq: Once | INTRAMUSCULAR | Status: AC
  Administered 2020-02-01: 120 mg via EPIDURAL

## 2020-02-01 NOTE — Discharge Instructions (Signed)

## 2020-02-20 ENCOUNTER — Encounter: Payer: Self-pay | Admitting: Osteopathic Medicine

## 2020-02-22 NOTE — Telephone Encounter (Signed)
Opened PA request for Ozempic on covermymeds. "This request has been approved using information available on the patient's profile. [id number]:[id number];Review Type:Prior Auth;Coverage Start Date:01/22/2020;Coverage End Date:02/20/2023"

## 2020-04-16 ENCOUNTER — Other Ambulatory Visit: Payer: Self-pay

## 2020-04-16 ENCOUNTER — Ambulatory Visit (INDEPENDENT_AMBULATORY_CARE_PROVIDER_SITE_OTHER): Payer: BC Managed Care – PPO

## 2020-04-16 ENCOUNTER — Ambulatory Visit (INDEPENDENT_AMBULATORY_CARE_PROVIDER_SITE_OTHER): Payer: BC Managed Care – PPO | Admitting: Sports Medicine

## 2020-04-16 DIAGNOSIS — M1611 Unilateral primary osteoarthritis, right hip: Secondary | ICD-10-CM | POA: Diagnosis not present

## 2020-04-16 NOTE — Assessment & Plan Note (Signed)
This is a very pleasant 65 year old male, exactly 1 year ago we did a right hip joint injection, he did really well for a year, now having recurrence of pain, repeat injection today, return as needed.

## 2020-04-16 NOTE — Progress Notes (Signed)
    Procedures performed today:    Procedure: Real-time Ultrasound Guided injection of the right hip joint Device: Samsung [id number]  Verbal informed consent obtained.  Time-out conducted.  Noted no overlying erythema, induration, or other signs of local infection.  Skin prepped in a sterile fashion.  Local anesthesia: Topical Ethyl chloride.  With sterile technique and under real time ultrasound guidance:  Noted arthritic hip, 1 cc Kenalog 40, 2 cc lidocaine, 2 cc bupivacaine injected easily Completed without difficulty  Advised to call if fevers/chills, erythema, induration, drainage, or persistent bleeding.  Images permanently stored and available for review in PACS.  Impression: Technically successful ultrasound guided injection.  Independent interpretation of notes and tests performed by another provider:   None.  Brief History, Exam, Impression, and Recommendations:    Primary osteoarthritis of right hip This is a very pleasant 65 year old male, exactly 1 year ago we did a right hip joint injection, he did really well for a year, now having recurrence of pain, repeat injection today, return as needed.    ___________________________________________ Gwen Her. Dianah Field, M.D., ABFM., CAQSM. Primary Care and [facility] Instructor of [place] [facility]

## 2020-05-02 IMAGING — XA Imaging study
2 series · 2 of 2 positions shown · non-contrast
Comparison: none

CLINICAL DATA: Lumbosacral spondylosis without myelopathy. Left
anterior thigh pain. 50% improvement following the first epidural
injection.

[Series 1: ortho standard · 1 of 1 slices shown (1 of 2)]
[im 1/1]
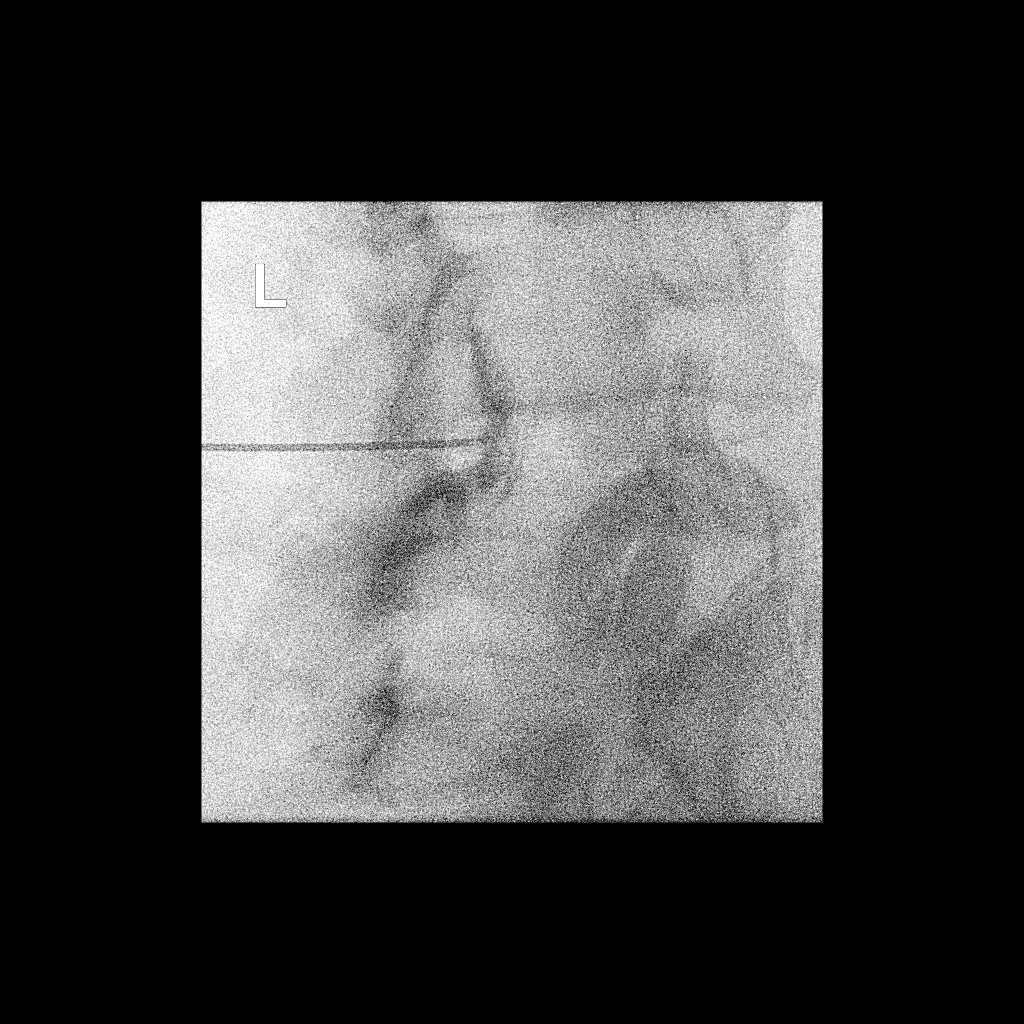

[Series 2: ortho standard · 1 of 1 slices shown (2 of 2)]
[im 1/1]
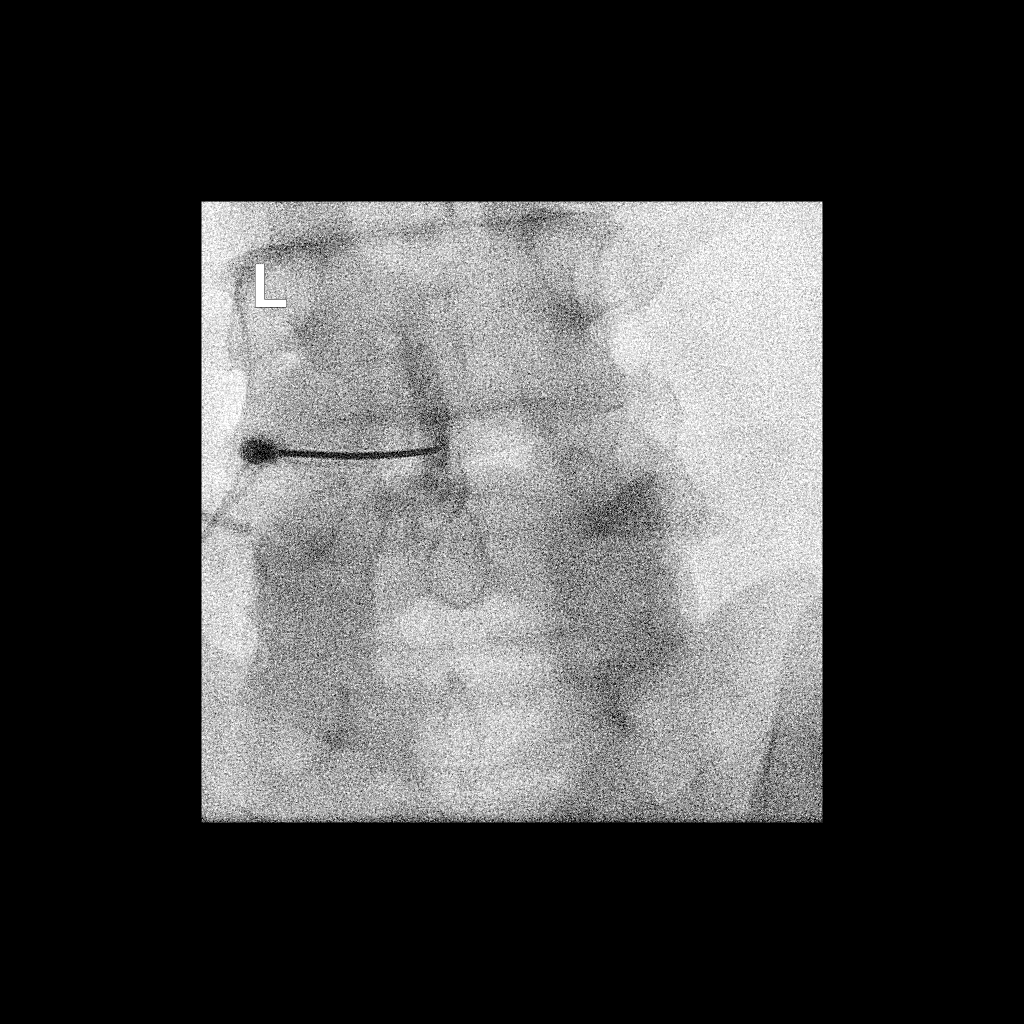

[2 of 2 positions shown; findings below may reference images not displayed]

FLUOROSCOPY TIME:  Fluoroscopy Time: 17 seconds

Radiation Exposure Index: 13.39 microGray*m^2

PROCEDURE:
The procedure, risks, benefits, and alternatives were explained to
the patient. Questions regarding the procedure were encouraged and
answered. The patient understands and consents to the procedure.

LUMBAR EPIDURAL INJECTION:

An interlaminar approach was performed on the left at L4-5. The
overlying skin was cleansed and anesthetized. A 3.5 inch 20 gauge
epidural needle was advanced using loss-of-resistance technique.

DIAGNOSTIC EPIDURAL INJECTION:

Injection of Isovue-M 200 shows a good epidural pattern with spread
above and below the level of needle placement, primarily on the
left. No vascular opacification is seen.

THERAPEUTIC EPIDURAL INJECTION:

120 mg of Depo-Medrol mixed with 3 mL of 1% lidocaine were
instilled. The procedure was well-tolerated, and the patient was
discharged thirty minutes following the injection in good condition.

COMPLICATIONS:
None
IMPRESSION: Technically successful interlaminar epidural injection on the left
at L4-5.

## 2020-05-23 ENCOUNTER — Ambulatory Visit: Payer: Managed Care, Other (non HMO) | Admitting: Osteopathic Medicine

## 2020-05-23 ENCOUNTER — Telehealth: Payer: Self-pay | Admitting: Osteopathic Medicine

## 2020-05-23 DIAGNOSIS — Z125 Encounter for screening for malignant neoplasm of prostate: Secondary | ICD-10-CM

## 2020-05-23 DIAGNOSIS — Z0000 Encounter for general adult medical examination without abnormal findings: Secondary | ICD-10-CM

## 2020-05-23 DIAGNOSIS — I10 Essential (primary) hypertension: Secondary | ICD-10-CM

## 2020-05-23 DIAGNOSIS — E119 Type 2 diabetes mellitus without complications: Secondary | ICD-10-CM

## 2020-05-23 NOTE — Telephone Encounter (Signed)
Issues w/ [facility], labs ordered for [facility]

## 2020-06-11 LAB — LIPID PANEL
Chol/HDL Ratio: 2.5 ratio (ref 0.0–5.0)
Cholesterol, Total: 154 mg/dL (ref 100–199)
HDL: 61 mg/dL (ref >39)
LDL Chol Calc (NIH): 81 mg/dL (ref 0–99)
Triglycerides: 56 mg/dL (ref 0–149)
VLDL Cholesterol Cal: 12 mg/dL (ref 5–40)

## 2020-06-11 LAB — CMP14+EGFR
ALT: 16 IU/L (ref 0–44)
AST: 18 IU/L (ref 0–40)
Albumin/Globulin Ratio: 2 (ref 1.2–2.2)
Albumin: 4.6 g/dL (ref 3.8–4.8)
Alkaline Phosphatase: 93 IU/L (ref 44–121)
BUN/Creatinine Ratio: 12 (ref 10–24)
BUN: 13 mg/dL (ref 8–27)
Bilirubin Total: 0.7 mg/dL (ref 0.0–1.2)
CO2: 23 mmol/L (ref 20–29)
Calcium: 9.1 mg/dL (ref 8.6–10.2)
Chloride: 99 mmol/L (ref 96–106)
Creatinine, Ser: 1.12 mg/dL (ref 0.76–1.27)
Globulin, Total: 2.3 g/dL (ref 1.5–4.5)
Glucose: 83 mg/dL (ref 65–99)
Potassium: 4.2 mmol/L (ref 3.5–5.2)
Sodium: 137 mmol/L (ref 134–144)
Total Protein: 6.9 g/dL (ref 6.0–8.5)
eGFR: 74 mL/min/{1.73_m2} (ref >59)

## 2020-06-11 LAB — CBC
Hematocrit: 40.9 % (ref 37.5–51.0)
Hemoglobin: 13.1 g/dL (ref 13.0–17.7)
MCH: 22.7 pg — ABNORMAL LOW (ref 26.6–33.0)
MCHC: 32 g/dL (ref 31.5–35.7)
MCV: 71 fL — ABNORMAL LOW (ref 79–97)
Platelets: 250 10*3/uL (ref 150–450)
RBC: 5.76 x10E6/uL (ref 4.14–5.80)
RDW: 15 % (ref 11.6–15.4)
WBC: 7.8 10*3/uL (ref 3.4–10.8)

## 2020-06-11 LAB — PSA TOTAL (REFLEX TO FREE): Prostate Specific Ag, Serum: 1 ng/mL (ref 0.0–4.0)

## 2020-06-11 LAB — HEMOGLOBIN A1C
Est. average glucose Bld gHb Est-mCnc: 123 mg/dL
Hgb A1c MFr Bld: 5.9 % — ABNORMAL HIGH (ref 4.8–5.6)

## 2020-06-13 ENCOUNTER — Other Ambulatory Visit: Payer: Self-pay

## 2020-06-13 ENCOUNTER — Encounter: Payer: Self-pay | Admitting: Osteopathic Medicine

## 2020-06-13 ENCOUNTER — Ambulatory Visit (INDEPENDENT_AMBULATORY_CARE_PROVIDER_SITE_OTHER): Payer: BC Managed Care – PPO | Admitting: Osteopathic Medicine

## 2020-06-13 DIAGNOSIS — I10 Essential (primary) hypertension: Secondary | ICD-10-CM

## 2020-06-13 DIAGNOSIS — E119 Type 2 diabetes mellitus without complications: Secondary | ICD-10-CM

## 2020-06-13 DIAGNOSIS — E785 Hyperlipidemia, unspecified: Secondary | ICD-10-CM

## 2020-06-13 DIAGNOSIS — E1169 Type 2 diabetes mellitus with other specified complication: Secondary | ICD-10-CM | POA: Diagnosis not present

## 2020-06-13 MED ORDER — ATORVASTATIN CALCIUM 20 MG PO TABS: 20.0000 mg | ORAL_TABLET | Freq: Every day | ORAL | 3 refills | Status: DC

## 2020-06-13 MED ORDER — VALSARTAN 160 MG PO TABS: ORAL_TABLET | ORAL | 1 refills | Status: DC

## 2020-06-13 MED ORDER — CETIRIZINE HCL 10 MG PO TABS: 10.0000 mg | ORAL_TABLET | Freq: Every day | ORAL | 3 refills | Status: DC

## 2020-06-13 MED ORDER — METFORMIN HCL ER 500 MG PO TB24: 1000.0000 mg | ORAL_TABLET | Freq: Every day | ORAL | 3 refills | Status: DC

## 2020-06-13 MED ORDER — FLUTICASONE PROPIONATE 50 MCG/ACT NA SUSP: NASAL | 11 refills | Status: DC

## 2020-06-13 NOTE — Progress Notes (Signed)
Justin Frank is a 65 y.o. male who presents to  [place] at [facility]  today, 06/13/20, seeking care for the following:  . Annual physical      ASSESSMENT & PLAN with other pertinent findings:  Diagnoses of Hyperlipidemia associated with type 2 diabetes mellitus ([place]), Controlled type 2 diabetes mellitus without complication, without long-term current use of insulin ([place]), and Essential hypertension were pertinent to this visit.    Patient Instructions  General Preventive Care  Most recent routine screening labs: see attached.   Blood pressure goal 130/80 or less.   Tobacco: don't!   Alcohol: responsible moderation is ok for most adults - if you have concerns about your alcohol intake, please talk to me!   Exercise: as tolerated to reduce risk of cardiovascular disease and diabetes. Strength training will also prevent osteoporosis.   Mental health: if need for mental health care (medicines, counseling, other), or concerns about moods, please let me know!   Sexual / Reproductive health: if need for STI testing, or if concerns with libido/pain problems, please let me know!   Advanced Directive: Living Will and/or Healthcare Power of Attorney recommended for all adults, regardless of age or health.  Vaccines  Flu vaccine: every fall/winter   Shingles vaccine: all done!   Pneumonia vaccines: done for now, plan for booster after age 40  Tetanus booster: every 10 years - DUE 2029  COVID vaccine: THANKS for getting your vaccine! :) Cancer screenings   Colon cancer screening: Colonoscopy 07/2022  Prostate cancer screening: PSA blood test age 36-71 annually - ok  Lung cancer screening: not needed since quit smoking >15 years ago Infection screenings  . HIV: recommended screening at least once age 44-65 . Gonorrhea/Chlamydia: screening as needed. . Hepatitis C: recommended once for everyone age 43-75 . TB: certain  at-risk populations, or depending on work requirements and/or travel history Other . Bone Density Test: recommended for men at age 63 . Abdominal Aortic Aneurysm: screening with ultrasound recommended once for men age 73-75 who have EVER smoked        Standard of care for diabetic patients includes the following: Medications:  Metformin to help control sugars and slow/prevent progression of diabetes from bad to worse, even if sugars are at goal. Kidney function needs to be monitored on this medicine.   Blood pressure medicine ACE/ARB (several possible prescriptions in this class of drug) to help protect kidneys long-term, even if blood pressure is at goal (<130/80). If not on this medicine, we need to test annually for protein in the urine.   Statin medications to prevent against heart attack/stroke, even if cholesterol numbers are at goal (LDL/bad cholesterol <70).   If you have questions about medications, please let me know!  Other:  A1c depends on age, your goal is <7.0  Blood pressure <130/80  LDL <70 - close at 80! pneumonia vaccine once before age 68 all done  obtaining annual eye exam - need records   frequently self-examining the feet and having a medical foot exam at least once per year    No orders of the defined types were placed in this encounter.   Meds ordered this encounter  Medications  . atorvastatin (LIPITOR) 20 MG tablet    Sig: Take 1 tablet (20 mg total) by mouth daily.    Dispense:  90 tablet    Refill:  3  . cetirizine (ZYRTEC) 10 MG tablet    Sig: Take 1  tablet (10 mg total) by mouth daily.    Dispense:  90 tablet    Refill:  3  . fluticasone (FLONASE) 50 MCG/ACT nasal spray    Sig: Place 1 spray in each nostril once daily.    Dispense:  16 g    Refill:  11  . metFORMIN (GLUCOPHAGE-XR) 500 MG 24 hr tablet    Sig: Take 2 tablets (1,000 mg total) by mouth daily with breakfast.    Dispense:  180 tablet    Refill:  3    Cancel 500 mg daily  dose thanks  . valsartan (DIOVAN) 160 MG tablet    Sig: TAKE 1 TABLET(160 MG) BY MOUTH DAILY    Dispense:  90 tablet    Refill:  1     See below for relevant physical exam findings  See below for recent lab and imaging results reviewed  Medications, allergies, PMH, PSH, SocH, FamH reviewed below    Follow-up instructions: Return in about 6 months (around 12/13/2020) for monitor A1C .                                        Exam:  BP (!) 147/83 (BP Location: Left Arm, Patient Position: Sitting, Cuff Size: Normal)   Pulse 82   Temp 98.5 F (36.9 C) (Oral)   Wt 177 lb 0.6 oz (80.3 kg)   BMI 30.39 kg/m   Constitutional: VS see above. General Appearance: alert, well-developed, well-nourished, NAD  Neck: No masses, trachea midline.   Respiratory: Normal respiratory effort. no wheeze, no rhonchi, no rales  Cardiovascular: S1/S2 normal, no murmur, no rub/gallop auscultated. RRR.   Musculoskeletal: Gait normal. Symmetric and independent movement of all extremities  Abdominal: non-tender, non-distended, no appreciable organomegaly, neg Murphy's, BS WNLx4  Neurological: Normal balance/coordination. No tremor.  Skin: warm, dry, intact.   Psychiatric: Normal judgment/insight. Normal mood and affect. Oriented x3.   Current Meds  Medication Sig  . amLODipine (NORVASC) 10 MG tablet TAKE 1 TABLET(10 MG) BY MOUTH DAILY  . hydrOXYzine (ATARAX/VISTARIL) 25 MG tablet TAKE 1 TABLET(25 MG) BY MOUTH EVERY 6 HOURS AS NEEDED FOR ANXIETY OR SLEEP  . linaclotide (LINZESS) 145 MCG CAPS capsule Take 1 capsule (145 mcg total) by mouth daily before breakfast. (Patient taking differently: Take 145 mcg by mouth daily before breakfast. PRN)  . omeprazole (PRILOSEC) 40 MG capsule Take 1 capsule (40 mg total) by mouth daily.  . Semaglutide, 1 MG/DOSE, (OZEMPIC, 1 MG/DOSE,) 2 MG/1.5ML SOPN Inject 1 mg into the skin once a week.  . [DISCONTINUED] atorvastatin (LIPITOR)  20 MG tablet Take 1 tablet (20 mg total) by mouth daily.  . [DISCONTINUED] cetirizine (ZYRTEC) 10 MG tablet Take 1 tablet (10 mg total) by mouth daily.  . [DISCONTINUED] fluticasone (FLONASE) 50 MCG/ACT nasal spray Place 1 spray in each nostril once daily.  . [DISCONTINUED] metFORMIN (GLUCOPHAGE-XR) 500 MG 24 hr tablet TAKE 1 TABLET(500 MG) BY MOUTH DAILY WITH BREAKFAST  . [DISCONTINUED] valsartan (DIOVAN) 160 MG tablet TAKE 1 TABLET(160 MG) BY MOUTH DAILY    Allergies  Allergen Reactions  . Atorvastatin Swelling    " lip swelling "    . Lisinopril Cough    Other reaction(s): Cough (ALLERGY/intolerance)    Patient Active Problem List   Diagnosis Date Noted  . Primary osteoarthritis of left knee 04/18/2019  . Primary osteoarthritis of right hip 04/18/2019  . Pes  planus 04/11/2019  . Left lumbar radiculitis 02/06/2019  . Onychomycosis of multiple toenails with type 2 diabetes mellitus ([place]) 09/14/2018  . BMI 35.0-35.9,adult 06/14/2017  . History of asbestos exposure 04/29/2017  . Pleural plaque 04/28/2017  . Mild intermittent asthma without complication 81/15/7262  . Bronchiectasis without complication ([place]) 03/55/9741  . Controlled type 2 diabetes mellitus without complication, without long-term current use of insulin ([place]) 03/22/2017  . Elevated fasting glucose 03/16/2017  . Weight gain 03/16/2017  . Elevated hemoglobin A1c 03/16/2017  . Vitamin D deficiency 03/16/2017  . Class 2 obesity in adult 02/15/2017  . Lymphadenopathy, mediastinal 02/15/2017  . RBC microcytosis 02/15/2017  . Alpha-thalassemia ([place]) 02/15/2017  . Essential hypertension 05/15/2014  . Hyperlipidemia 05/15/2014  . Recurrent umbilical hernia 63/84/5364    Family History  Problem Relation Age of Onset  . Hypertension Mother   . Hypertension Father   . Colon cancer Neg Hx   . Esophageal cancer Neg Hx   . Rectal cancer Neg Hx   . Stomach cancer Neg Hx     Social History   Tobacco Use  Smoking  Status Former Smoker  . Quit date: 03/02/1977  . Years since quitting: 43.3  Smokeless Tobacco Never Used    Past Surgical History:  Procedure Laterality Date  . COLONOSCOPY    . HERNIA REPAIR     As per patient - 2012, 2013, 2015    Immunization History  Administered Date(s) Administered  . Influenza, Seasonal, Injecte, Preservative Fre 03/15/2017  . Influenza,inj,Quad PF,6+ Mos 03/15/2017, 01/03/2018, 12/21/2018, 01/24/2020  . Moderna Sars-Covid-2 Vaccination 05/10/2019, 06/07/2019  . Pneumococcal Polysaccharide-23 06/14/2017  . Pneumococcal-Unspecified 06/14/2017  . Tdap 06/14/2017  . Zoster Recombinat (Shingrix) 03/22/2019, 06/26/2019    Recent Results (from the past 2160 hour(s))  CBC     Status: Abnormal   Collection Time: 06/10/20  3:19 PM  Result Value Ref Range   WBC 7.8 3.4 - 10.8 x10E3/uL   RBC 5.76 4.14 - 5.80 x10E6/uL   Hemoglobin 13.1 13.0 - 17.7 g/dL   Hematocrit 40.9 37.5 - 51.0 %   MCV 71 (L) 79 - 97 fL   MCH 22.7 (L) 26.6 - 33.0 pg   MCHC 32.0 31.5 - 35.7 g/dL   RDW 15.0 11.6 - 15.4 %   Platelets 250 150 - 450 x10E3/uL  Lipid panel     Status: None   Collection Time: 06/10/20  3:19 PM  Result Value Ref Range   Cholesterol, Total 154 100 - 199 mg/dL   Triglycerides 56 0 - 149 mg/dL   HDL 61 >39 mg/dL   VLDL Cholesterol Cal 12 5 - 40 mg/dL   LDL Chol Calc (NIH) 81 0 - 99 mg/dL   Chol/HDL Ratio 2.5 0.0 - 5.0 ratio    Comment:                                   T. Chol/HDL Ratio                                             Men  Women                               1/2 Avg.Risk  3.4    3.3  Avg.Risk  5.0    4.4                                2X Avg.Risk  9.6    7.1                                3X Avg.Risk 23.4   11.0   Hemoglobin A1c     Status: Abnormal   Collection Time: 06/10/20  3:19 PM  Result Value Ref Range   Hgb A1c MFr Bld 5.9 (H) 4.8 - 5.6 %    Comment:          Prediabetes: 5.7 - 6.4          Diabetes:  >6.4          Glycemic control for adults with diabetes: <7.0    Est. average glucose Bld gHb Est-mCnc 123 mg/dL  PSA Total (Reflex To Free)     Status: None   Collection Time: 06/10/20  3:19 PM  Result Value Ref Range   Prostate Specific Ag, Serum 1.0 0.0 - 4.0 ng/mL    Comment: Roche ECLIA methodology. According to the American Urological Association, Serum PSA should decrease and remain at undetectable levels after radical prostatectomy. The AUA defines biochemical recurrence as an initial PSA value 0.2 ng/mL or greater followed by a subsequent confirmatory PSA value 0.2 ng/mL or greater. Values obtained with different assay methods or kits cannot be used interchangeably. Results cannot be interpreted as absolute evidence of the presence or absence of malignant disease.    Reflex Criteria Comment     Comment: The percent free PSA is performed on a reflex basis only when the total PSA is between 4.0 and 10.0 ng/mL.   CMP14+EGFR     Status: None   Collection Time: 06/10/20  3:20 PM  Result Value Ref Range   Glucose 83 65 - 99 mg/dL   BUN 13 8 - 27 mg/dL   Creatinine, Ser 1.12 0.76 - 1.27 mg/dL   eGFR 74 >59 mL/min/1.73   BUN/Creatinine Ratio 12 10 - 24   Sodium 137 134 - 144 mmol/L   Potassium 4.2 3.5 - 5.2 mmol/L   Chloride 99 96 - 106 mmol/L   CO2 23 20 - 29 mmol/L   Calcium 9.1 8.6 - 10.2 mg/dL   Total Protein 6.9 6.0 - 8.5 g/dL   Albumin 4.6 3.8 - 4.8 g/dL   Globulin, Total 2.3 1.5 - 4.5 g/dL   Albumin/Globulin Ratio 2.0 1.2 - 2.2   Bilirubin Total 0.7 0.0 - 1.2 mg/dL   Alkaline Phosphatase 93 44 - 121 IU/L   AST 18 0 - 40 IU/L   ALT 16 0 - 44 IU/L    No results found.     All questions at time of visit were answered - patient instructed to contact office with any additional concerns or updates. ER/RTC precautions were reviewed with the patient as applicable.   Please note: manual typing as well as voice recognition software may have been used to produce this  document - typos may escape review. Please contact Dr. Sheppard Coil for any needed clarifications.

## 2020-06-13 NOTE — Patient Instructions (Addendum)
General Preventive Care  Most recent routine screening labs: see attached.   Blood pressure goal 130/80 or less.   Tobacco: don't!   Alcohol: responsible moderation is ok for most adults - if you have concerns about your alcohol intake, please talk to me!   Exercise: as tolerated to reduce risk of cardiovascular disease and diabetes. Strength training will also prevent osteoporosis.   Mental health: if need for mental health care (medicines, counseling, other), or concerns about moods, please let me know!   Sexual / Reproductive health: if need for STI testing, or if concerns with libido/pain problems, please let me know!   Advanced Directive: Living Will and/or Healthcare Power of Attorney recommended for all adults, regardless of age or health.  Vaccines  Flu vaccine: every fall/winter   Shingles vaccine: all done!   Pneumonia vaccines: done for now, plan for booster after age 40  Tetanus booster: every 10 years - DUE 2029  COVID vaccine: THANKS for getting your vaccine! :) Cancer screenings   Colon cancer screening: Colonoscopy 07/2022  Prostate cancer screening: PSA blood test age 8-71 annually - ok  Lung cancer screening: not needed since quit smoking >15 years ago Infection screenings  . HIV: recommended screening at least once age 42-65 . Gonorrhea/Chlamydia: screening as needed. . Hepatitis C: recommended once for everyone age 6-75 . TB: certain at-risk populations, or depending on work requirements and/or travel history Other . Bone Density Test: recommended for men at age 45 . Abdominal Aortic Aneurysm: screening with ultrasound recommended once for men age 33-75 who have EVER smoked        Standard of care for diabetic patients includes the following: Medications:  Metformin to help control sugars and slow/prevent progression of diabetes from bad to worse, even if sugars are at goal. Kidney function needs to be monitored on this medicine.   Blood  pressure medicine ACE/ARB (several possible prescriptions in this class of drug) to help protect kidneys long-term, even if blood pressure is at goal (<130/80). If not on this medicine, we need to test annually for protein in the urine.   Statin medications to prevent against heart attack/stroke, even if cholesterol numbers are at goal (LDL/bad cholesterol <70).   If you have questions about medications, please let me know!  Other:  A1c depends on age, your goal is <7.0  Blood pressure <130/80  LDL <70 - close at 80! pneumonia vaccine once before age 81 all done  obtaining annual eye exam - need records   frequently self-examining the feet and having a medical foot exam at least once per year

## 2020-07-04 ENCOUNTER — Other Ambulatory Visit: Payer: Self-pay | Admitting: Osteopathic Medicine

## 2020-07-09 ENCOUNTER — Encounter: Payer: Self-pay | Admitting: Osteopathic Medicine

## 2020-07-10 ENCOUNTER — Telehealth: Payer: Self-pay

## 2020-07-10 NOTE — Telephone Encounter (Signed)
Pt called stating he continues to have coughing, low chills and running nose with temperature. Pls contact the patient to schedule a virtual appointment with provider. Thanks in advance.

## 2020-07-10 NOTE — Telephone Encounter (Signed)
Virtual visit has been scheduled. AM

## 2020-07-11 ENCOUNTER — Encounter: Payer: Self-pay | Admitting: Osteopathic Medicine

## 2020-07-11 ENCOUNTER — Telehealth (INDEPENDENT_AMBULATORY_CARE_PROVIDER_SITE_OTHER): Payer: BC Managed Care – PPO | Admitting: Osteopathic Medicine

## 2020-07-11 VITALS — Wt 180.0 lb

## 2020-07-11 DIAGNOSIS — B9789 Other viral agents as the cause of diseases classified elsewhere: Secondary | ICD-10-CM | POA: Diagnosis not present

## 2020-07-11 DIAGNOSIS — J988 Other specified respiratory disorders: Secondary | ICD-10-CM

## 2020-07-11 MED ORDER — LIDOCAINE VISCOUS HCL 2 % MT SOLN: 10.0000 mL | OROMUCOSAL | 1 refills | Status: DC | PRN

## 2020-07-11 MED ORDER — IPRATROPIUM BROMIDE 0.06 % NA SOLN: 2.0000 | Freq: Four times a day (QID) | NASAL | 1 refills | Status: DC

## 2020-07-11 MED ORDER — GUAIFENESIN-CODEINE 100-10 MG/5ML PO SYRP: 5.0000 mL | ORAL_SOLUTION | Freq: Four times a day (QID) | ORAL | 0 refills | Status: DC | PRN

## 2020-07-11 NOTE — Progress Notes (Signed)
Telemedicine Visit via  Audio only - telephone (patient preference /  technical difficulty with MyChart video application)  I connected with Justin Frank on 07/11/20 at 10:09 AM  by phone or  telemedicine application as noted above  I verified that I am speaking with or regarding  the correct patient using two identifiers.  Participants: Myself, Dr Emeterio Reeve DO Patient: Justin Frank Patient proxy if applicable: none Other, if applicable: none  Patient is at home I am in office at [facility]    I discussed the limitations of evaluation and management  by telemedicine and the availability of in person appointments.  The participant(s) above expressed understanding and  agreed to proceed with this appointment via telemedicine.       History of Present Illness: Justin Frank is a 65 y.o. male who would like to discuss feeling sick    Onset Monday (3 days ago) COVID tested negative 3 days ago, thinks also tested for flu not not  Temp 100.4 on Monday, responded to anti-emetics   Aching in body Runny nose  Cough keeping him up through the night Sore throat worse from coughing  Dry coughing   Has tried lemon tea, Mucinex, steam vaporizer       Observations/Objective: Wt 180 lb (81.6 kg)   BMI 30.90 kg/m  BP Readings from Last 3 Encounters:  06/13/20 (!) 147/83  02/01/20 128/84  01/24/20 122/79   Exam: Normal Speech.  NAd  Lab and Radiology Results No results found for this or any previous visit (from the past 72 hour(s)). No results found.     Assessment and Plan: 65 y.o. male with The encounter diagnosis was Viral respiratory illness.   PDMP not reviewed this encounter. No orders of the defined types were placed in this encounter.  Meds ordered this encounter  Medications  . guaiFENesin-codeine (ROBITUSSIN AC) 100-10 MG/5ML syrup    Sig: Take 5-10 mLs by mouth 4 (four) times daily as needed for cough or  congestion.    Dispense:  180 mL    Refill:  0  . lidocaine (XYLOCAINE) 2 % solution    Sig: Use as directed 10 mLs in the mouth or throat every 3 (three) hours as needed (mouth/throat pain).    Dispense:  100 mL    Refill:  1  . ipratropium (ATROVENT) 0.06 % nasal spray    Sig: Place 2 sprays into both nostrils 4 (four) times daily. As needed for runny nose / postnasal drip    Dispense:  15 mL    Refill:  1   Patient Instructions  Medications & Home Remedies for Upper Respiratory Illness   Note: the following list assumes no pregnancy, normal liver & kidney function and no other drug interactions. Dr. Sheppard Coil has highlighted medications which are safe for you to use, but these may not be appropriate for everyone. Always ask a pharmacist or qualified medical provider if you have any questions!    Aches/Pains, Fever, Headache OTC Acetaminophen (Tylenol) 500 mg tablets - take max 2 tablets (1000 mg) every 6 hours (4 times per day)  OTC Ibuprofen (Motrin) 200 mg tablets - take max 4 tablets (800 mg) every 6 hours*   Sinus Congestion Prescription Atrovent as directed OTC Nasal Saline if desired to rinse OTC Oxymetolazone (Afrin, others) sparing use due to rebound congestion, NEVER use in kids OTC Phenylephrine (Sudafed) 10 mg tablets every 4 hours (or the 12-hour formulation)* OTC Diphenhydramine (Benadryl) 25 mg tablets -  take max 2 tablets every 4 hours   Cough & Sore Throat Prescription cough pills or syrups as directed Prescription Lidocaine syrup as directed to numb the throat  OTC Dextromethorphan (Robitussin, others) - cough suppressant OTC Guaifenesin (Robitussin, Mucinex, others) - expectorant (helps cough up mucus) (Dextromethorphan and Guaifenesin also come in a combination tablet/syrup) OTC Lozenges w/ Benzocaine + Menthol (Cepacol) Honey - as much as you want! Teas which "coat the throat" - look for ingredients Elm Bark, Licorice Root, Marshmallow Root   Other OTC  Zinc Lozenges within 24 hours of symptoms onset - mixed evidence this shortens the duration of the common cold Don't waste your money on Vitamin C or Echinacea in acute illness - it's already too late!    *Caution in patients with high blood pressure      Instructions sent via MyChart.   Follow Up Instructions: Return if symptoms worsen or fail to improve.    I discussed the assessment and treatment plan with the patient. The patient was provided an opportunity to ask questions and all were answered. The patient agreed with the plan and demonstrated an understanding of the instructions.   The patient was advised to call back or seek an in-person evaluation if any new concerns, if symptoms worsen or if the condition fails to improve as anticipated.  21 minutes of non-face-to-face time was provided during this encounter.      . . . . . . . . . . . . . Marland Kitchen                   Historical information moved to improve visibility of documentation.  Past Medical History:  Diagnosis Date  . Allergy   . Asthma   . Controlled type 2 diabetes mellitus without complication, without long-term current use of insulin ([place]) 03/22/2017  . ED (erectile dysfunction)   . GERD (gastroesophageal reflux disease)   . Hyperlipidemia   . Hypertension    Past Surgical History:  Procedure Laterality Date  . COLONOSCOPY    . HERNIA REPAIR     As per patient - 2012, 2013, 2015   Social History   Tobacco Use  . Smoking status: Former Smoker    Quit date: 03/02/1977    Years since quitting: 43.3  . Smokeless tobacco: Never Used  Substance Use Topics  . Alcohol use: No   family history includes Hypertension in his father and mother.  Medications: Current Outpatient Medications  Medication Sig Dispense Refill  . amLODipine (NORVASC) 10 MG tablet TAKE 1 TABLET(10 MG) BY MOUTH DAILY 60 tablet 3  . atorvastatin (LIPITOR) 20 MG tablet Take 1 tablet (20 mg total) by mouth  daily. 90 tablet 3  . cetirizine (ZYRTEC) 10 MG tablet Take 1 tablet (10 mg total) by mouth daily. 90 tablet 3  . fluticasone (FLONASE) 50 MCG/ACT nasal spray Place 1 spray in each nostril once daily. 16 g 11  . guaiFENesin-codeine (ROBITUSSIN AC) 100-10 MG/5ML syrup Take 5-10 mLs by mouth 4 (four) times daily as needed for cough or congestion. 180 mL 0  . hydrOXYzine (ATARAX/VISTARIL) 25 MG tablet TAKE 1 TABLET(25 MG) BY MOUTH EVERY 6 HOURS AS NEEDED FOR ANXIETY OR SLEEP 90 tablet 1  . ipratropium (ATROVENT) 0.06 % nasal spray Place 2 sprays into both nostrils 4 (four) times daily. As needed for runny nose / postnasal drip 15 mL 1  . lidocaine (XYLOCAINE) 2 % solution Use as directed 10 mLs in the mouth or  throat every 3 (three) hours as needed (mouth/throat pain). 100 mL 1  . linaclotide (LINZESS) 145 MCG CAPS capsule Take 1 capsule (145 mcg total) by mouth daily before breakfast. (Patient taking differently: Take 145 mcg by mouth daily before breakfast. PRN) 90 capsule 1  . metFORMIN (GLUCOPHAGE-XR) 500 MG 24 hr tablet Take 2 tablets (1,000 mg total) by mouth daily with breakfast. 180 tablet 3  . omeprazole (PRILOSEC) 40 MG capsule Take 1 capsule (40 mg total) by mouth daily. 90 capsule 3  . OZEMPIC, 1 MG/DOSE, 4 MG/3ML SOPN INJECT 1 MG UNDER THE SKIN ONCE A WEEK 9 mL 3  . valsartan (DIOVAN) 160 MG tablet TAKE 1 TABLET(160 MG) BY MOUTH DAILY 90 tablet 1   No current facility-administered medications for this visit.   Allergies  Allergen Reactions  . Atorvastatin Swelling    " lip swelling "    . Lisinopril Cough    Other reaction(s): Cough (ALLERGY/intolerance)     If phone visit, billing and coding can please add appropriate modifier if needed

## 2020-07-11 NOTE — Patient Instructions (Signed)
Medications & Home Remedies for Upper Respiratory Illness   Note: the following list assumes no pregnancy, normal liver & kidney function and no other drug interactions. Dr. Sheppard Coil has highlighted medications which are safe for you to use, but these may not be appropriate for everyone. Always ask a pharmacist or qualified medical provider if you have any questions!    Aches/Pains, Fever, Headache OTC Acetaminophen (Tylenol) 500 mg tablets - take max 2 tablets (1000 mg) every 6 hours (4 times per day)  OTC Ibuprofen (Motrin) 200 mg tablets - take max 4 tablets (800 mg) every 6 hours*   Sinus Congestion Prescription Atrovent as directed OTC Nasal Saline if desired to rinse OTC Oxymetolazone (Afrin, others) sparing use due to rebound congestion, NEVER use in kids OTC Phenylephrine (Sudafed) 10 mg tablets every 4 hours (or the 12-hour formulation)* OTC Diphenhydramine (Benadryl) 25 mg tablets - take max 2 tablets every 4 hours   Cough & Sore Throat Prescription cough pills or syrups as directed Prescription Lidocaine syrup as directed to numb the throat  OTC Dextromethorphan (Robitussin, others) - cough suppressant OTC Guaifenesin (Robitussin, Mucinex, others) - expectorant (helps cough up mucus) (Dextromethorphan and Guaifenesin also come in a combination tablet/syrup) OTC Lozenges w/ Benzocaine + Menthol (Cepacol) Honey - as much as you want! Teas which "coat the throat" - look for ingredients Elm Bark, Licorice Root, Marshmallow Root   Other OTC Zinc Lozenges within 24 hours of symptoms onset - mixed evidence this shortens the duration of the common cold Don't waste your money on Vitamin C or Echinacea in acute illness - it's already too late!    *Caution in patients with high blood pressure

## 2020-08-04 ENCOUNTER — Encounter: Payer: Self-pay | Admitting: Osteopathic Medicine

## 2020-08-06 ENCOUNTER — Ambulatory Visit: Payer: BC Managed Care – PPO | Admitting: Osteopathic Medicine

## 2020-08-20 ENCOUNTER — Encounter: Payer: Self-pay | Admitting: Osteopathic Medicine

## 2020-08-20 ENCOUNTER — Other Ambulatory Visit: Payer: Self-pay

## 2020-08-20 ENCOUNTER — Ambulatory Visit (INDEPENDENT_AMBULATORY_CARE_PROVIDER_SITE_OTHER): Payer: BC Managed Care – PPO | Admitting: Osteopathic Medicine

## 2020-08-20 VITALS — BP 126/73 | HR 83 | Temp 97.7°F | Wt 174.1 lb

## 2020-08-20 DIAGNOSIS — R140 Abdominal distension (gaseous): Secondary | ICD-10-CM

## 2020-08-20 DIAGNOSIS — K2950 Unspecified chronic gastritis without bleeding: Secondary | ICD-10-CM | POA: Diagnosis not present

## 2020-08-20 DIAGNOSIS — R143 Flatulence: Secondary | ICD-10-CM

## 2020-08-20 DIAGNOSIS — R142 Eructation: Secondary | ICD-10-CM

## 2020-08-20 DIAGNOSIS — R748 Abnormal levels of other serum enzymes: Secondary | ICD-10-CM

## 2020-08-20 DIAGNOSIS — K5909 Other constipation: Secondary | ICD-10-CM

## 2020-08-20 MED ORDER — LINACLOTIDE 145 MCG PO CAPS: 145.0000 ug | ORAL_CAPSULE | Freq: Every day | ORAL | 1 refills | Status: DC

## 2020-08-20 NOTE — Patient Instructions (Signed)
Since computer update, patient instructions are not coming through or printing properly on the After Visit Summary. Please see attached for patient instructions from today's visit!   

## 2020-08-20 NOTE — Progress Notes (Signed)
Justin Frank is a 65 y.o. male who presents to  [place] at [facility]  today, 08/20/20, seeking care for the following:  GI concerns:  Today reports constipation and straining w/ BM, he periodically takes Linzess and requests refill on this. Reports frequent bloating and flatulence. Notes a lot of belching and abdominal discomfort w/ meals.  Potentially exacerbating medications: Ozempic 1 mg weekly, Metformin XR 1000 mg daily Other GI meds: omeprazole 40 mg daily, Linzess 145 mcg daily prn  History of colon polyps, last f/u w/ GI 07/2019 for colonoscopy. Again (+)polyps, due to repeat in three years in 2024 Last CMP 06/10/20 was WNL  Wt Readings from Last 3 Encounters:  08/20/20 174 lb 1.3 oz (79 kg)  07/11/20 180 lb (81.6 kg)  06/13/20 177 lb 0.6 oz (80.3 kg)      ASSESSMENT & PLAN with other pertinent findings:  The primary encounter diagnosis was Chronic gastritis without bleeding, unspecified gastritis type. Diagnoses of Belching symptom, Bloating symptom, Flatulence symptom, and Chronic constipation were also pertinent to this visit.   PRINTED FOR PATIENT:   Plan:  Labs - blood work and stool specimen Medication - Linzess, take daily or every other day. If this causes diarrhea, we can reduce the dose. Metformin and/or Ozempic may be contributing to these symptoms, but I'm not convinced that's the problem since you've been on these for awhile.  Diet - see attached for Low FODMAP diet - pay attention to foods that seem to trigger symptoms (particularly bloating/flatulence). Or you can try cutting out one category at a time for a week and see if cutting out those foods improves your symptoms.  Referral - will get you in to GI if all testing normal and meds aren't helping     2017 UpToDate Characteristics and sources of common FODMAPs   Word that corresponds to letter in acronym Compounds in this category Foods that contain  these compounds  F Fermentable  O Oligosaccharides Fructans, galacto-oligosaccharides Wheat, barley, rye, onion, leek, white part of spring onion, garlic, shallots, artichokes, beetroot, fennel, peas, chicory, pistachio, cashews, legumes, lentils, and chickpeas   D Disaccharides Lactose Milk, custard, ice cream, and yogurt   M Monosaccharides "Free fructose" (fructose in excess of glucose) Apples, pears, mangoes, cherries, watermelon, asparagus, sugar snap peas, honey, high-fructose corn syrup   A And  P Polyols Sorbitol, mannitol, maltitol, and xylitol Apples, pears, apricots, cherries, nectarines, peaches, plums, watermelon, mushrooms, cauliflower, artificially sweetened chewing gum and confectionery   FODMAPs: fermentable oligosaccharides, disaccharides, monosaccharides, and polyols. Adapted by permission from [employer]: [employer]. Agustin Cree, [employer], [place]. Short-chain carbohydrates and functional gastrointestinal disorders. Am J Gastroenterol 2013; 108:707. Copyright  2013. www.nature.com/ajg. Graphic [phone] Version 2.0     Patient Instructions  Since computer update, patient instructions are not coming through or printing properly on the After Visit Summary. Please see attached for patient instructions from today's visit!   Orders Placed This Encounter  Procedures   Fecal leukocytes   Stool C-Diff Toxin Assay   CBC With Differential   Lipase   CMP14+EGFR   H. pylori antigen, stool   GI Profile, Stool, PCR    Meds ordered this encounter  Medications   linaclotide (LINZESS) 145 MCG CAPS capsule    Sig: Take 1 capsule (145 mcg total) by mouth daily before breakfast.    Dispense:  90 capsule    Refill:  1  See below for relevant physical exam findings  See below for recent lab and imaging results reviewed  Medications, allergies, PMH, PSH, SocH, FamH reviewed below    Follow-up instructions: Return for RECHECK  PENDING RESULTS / IF WORSE OR CHANGE.                                        Exam:  BP 126/73 (BP Location: Left Arm, Patient Position: Sitting, Cuff Size: Normal)   Pulse 83   Temp 97.7 F (36.5 C) (Oral)   Wt 174 lb 1.3 oz (79 kg)   BMI 29.88 kg/m  Constitutional: VS see above. General Appearance: alert, well-developed, well-nourished, NAD Neck: No masses, trachea midline.  Respiratory: Normal respiratory effort. no wheeze, no rhonchi, no rales Cardiovascular: S1/S2 normal, no murmur, no rub/gallop auscultated. RRR.  Musculoskeletal: Gait normal. Symmetric and independent movement of all extremities Abdominal: non-tender, non-distended, no appreciable organomegaly, neg Murphy's, BS WNLx4 Neurological: Normal balance/coordination. No tremor. Skin: warm, dry, intact.  Psychiatric: Normal judgment/insight. Normal mood and affect. Oriented x3.   No outpatient medications have been marked as taking for the 08/20/20 encounter (Office Visit) with Emeterio Reeve, DO.    Allergies  Allergen Reactions   Atorvastatin Swelling    " lip swelling "     Lisinopril Cough    Other reaction(s): Cough (ALLERGY/intolerance)    Patient Active Problem List   Diagnosis Date Noted   Primary osteoarthritis of left knee 04/18/2019   Primary osteoarthritis of right hip 04/18/2019   Pes planus 04/11/2019   Left lumbar radiculitis 02/06/2019   Onychomycosis of multiple toenails with type 2 diabetes mellitus ([place]) 09/14/2018   BMI 35.0-35.9,adult 06/14/2017   History of asbestos exposure 04/29/2017   Pleural plaque 04/28/2017   Mild intermittent asthma without complication 76/80/8811   Bronchiectasis without complication ([place]) 05/14/9456   Controlled type 2 diabetes mellitus without complication, without long-term current use of insulin ([place]) 03/22/2017   Elevated fasting glucose 03/16/2017   Weight gain 03/16/2017   Elevated hemoglobin A1c 03/16/2017    Vitamin D deficiency 03/16/2017   Class 2 obesity in adult 02/15/2017   Lymphadenopathy, mediastinal 02/15/2017   RBC microcytosis 02/15/2017   Alpha-thalassemia ([place]) 02/15/2017   Essential hypertension 05/15/2014   Hyperlipidemia 05/15/2014   Recurrent umbilical hernia 59/29/2446    Family History  Problem Relation Age of Onset   Hypertension Mother    Hypertension Father    Colon cancer Neg Hx    Esophageal cancer Neg Hx    Rectal cancer Neg Hx    Stomach cancer Neg Hx     Social History   Tobacco Use  Smoking Status Former   Pack years: 0.00   Types: Cigarettes   Quit date: 03/02/1977   Years since quitting: 43.4  Smokeless Tobacco Never    Past Surgical History:  Procedure Laterality Date   COLONOSCOPY     HERNIA REPAIR     As per patient - 2012, 2013, 2015    Immunization History  Administered Date(s) Administered   Influenza, Seasonal, Injecte, Preservative Fre 03/15/2017   Influenza,inj,Quad PF,6+ Mos 03/15/2017, 01/03/2018, 12/21/2018, 01/24/2020   Moderna Sars-Covid-2 Vaccination 05/10/2019, 06/07/2019   Pneumococcal Polysaccharide-23 06/14/2017   Pneumococcal-Unspecified 06/14/2017   Tdap 06/14/2017   Zoster Recombinat (Shingrix) 03/22/2019, 06/26/2019    Recent Results (from the past 2160 hour(s))  CBC     Status: Abnormal  Collection Time: 06/10/20  3:19 PM  Result Value Ref Range   WBC 7.8 3.4 - 10.8 x10E3/uL   RBC 5.76 4.14 - 5.80 x10E6/uL   Hemoglobin 13.1 13.0 - 17.7 g/dL   Hematocrit 40.9 37.5 - 51.0 %   MCV 71 (L) 79 - 97 fL   MCH 22.7 (L) 26.6 - 33.0 pg   MCHC 32.0 31.5 - 35.7 g/dL   RDW 15.0 11.6 - 15.4 %   Platelets 250 150 - 450 x10E3/uL  Lipid panel     Status: None   Collection Time: 06/10/20  3:19 PM  Result Value Ref Range   Cholesterol, Total 154 100 - 199 mg/dL   Triglycerides 56 0 - 149 mg/dL   HDL 61 >39 mg/dL   VLDL Cholesterol Cal 12 5 - 40 mg/dL   LDL Chol Calc (NIH) 81 0 - 99 mg/dL   Chol/HDL Ratio 2.5 0.0 - 5.0  ratio    Comment:                                   T. Chol/HDL Ratio                                             Men  Women                               1/2 Avg.Risk  3.4    3.3                                   Avg.Risk  5.0    4.4                                2X Avg.Risk  9.6    7.1                                3X Avg.Risk 23.4   11.0   Hemoglobin A1c     Status: Abnormal   Collection Time: 06/10/20  3:19 PM  Result Value Ref Range   Hgb A1c MFr Bld 5.9 (H) 4.8 - 5.6 %    Comment:          Prediabetes: 5.7 - 6.4          Diabetes: >6.4          Glycemic control for adults with diabetes: <7.0    Est. average glucose Bld gHb Est-mCnc 123 mg/dL  PSA Total (Reflex To Free)     Status: None   Collection Time: 06/10/20  3:19 PM  Result Value Ref Range   Prostate Specific Ag, Serum 1.0 0.0 - 4.0 ng/mL    Comment: Roche ECLIA methodology. According to the American Urological Association, Serum PSA should decrease and remain at undetectable levels after radical prostatectomy. The AUA defines biochemical recurrence as an initial PSA value 0.2 ng/mL or greater followed by a subsequent confirmatory PSA value 0.2 ng/mL or greater. Values obtained with different assay methods or kits cannot be used interchangeably. Results cannot be interpreted as absolute evidence of  the presence or absence of malignant disease.    Reflex Criteria Comment     Comment: The percent free PSA is performed on a reflex basis only when the total PSA is between 4.0 and 10.0 ng/mL.   CMP14+EGFR     Status: None   Collection Time: 06/10/20  3:20 PM  Result Value Ref Range   Glucose 83 65 - 99 mg/dL   BUN 13 8 - 27 mg/dL   Creatinine, Ser 1.12 0.76 - 1.27 mg/dL   eGFR 74 >59 mL/min/1.73   BUN/Creatinine Ratio 12 10 - 24   Sodium 137 134 - 144 mmol/L   Potassium 4.2 3.5 - 5.2 mmol/L   Chloride 99 96 - 106 mmol/L   CO2 23 20 - 29 mmol/L   Calcium 9.1 8.6 - 10.2 mg/dL   Total Protein 6.9 6.0 - 8.5 g/dL    Albumin 4.6 3.8 - 4.8 g/dL   Globulin, Total 2.3 1.5 - 4.5 g/dL   Albumin/Globulin Ratio 2.0 1.2 - 2.2   Bilirubin Total 0.7 0.0 - 1.2 mg/dL   Alkaline Phosphatase 93 44 - 121 IU/L   AST 18 0 - 40 IU/L   ALT 16 0 - 44 IU/L    No results found.     All questions at time of visit were answered - patient instructed to contact office with any additional concerns or updates. ER/RTC precautions were reviewed with the patient as applicable.   Please note: manual typing as well as voice recognition software may have been used to produce this document - typos may escape review. Please contact Dr. Sheppard Coil for any needed clarifications.

## 2020-08-21 LAB — CMP14+EGFR
ALT: 17 IU/L (ref 0–44)
AST: 21 IU/L (ref 0–40)
Albumin/Globulin Ratio: 2.2 (ref 1.2–2.2)
Albumin: 4.4 g/dL (ref 3.8–4.8)
Alkaline Phosphatase: 86 IU/L (ref 44–121)
BUN/Creatinine Ratio: 15 (ref 10–24)
BUN: 15 mg/dL (ref 8–27)
Bilirubin Total: 0.4 mg/dL (ref 0.0–1.2)
CO2: 20 mmol/L (ref 20–29)
Calcium: 9 mg/dL (ref 8.6–10.2)
Chloride: 104 mmol/L (ref 96–106)
Creatinine, Ser: 0.98 mg/dL (ref 0.76–1.27)
Globulin, Total: 2 g/dL (ref 1.5–4.5)
Glucose: 90 mg/dL (ref 65–99)
Potassium: 4 mmol/L (ref 3.5–5.2)
Sodium: 143 mmol/L (ref 134–144)
Total Protein: 6.4 g/dL (ref 6.0–8.5)
eGFR: 87 mL/min/{1.73_m2} (ref >59)

## 2020-08-21 LAB — CBC WITH DIFFERENTIAL
Basophils Absolute: 0.1 10*3/uL (ref 0.0–0.2)
Basos: 1 %
EOS (ABSOLUTE): 0.2 10*3/uL (ref 0.0–0.4)
Eos: 3 %
Hematocrit: 40.7 % (ref 37.5–51.0)
Hemoglobin: 12.7 g/dL — ABNORMAL LOW (ref 13.0–17.7)
Immature Grans (Abs): 0 10*3/uL (ref 0.0–0.1)
Immature Granulocytes: 0 %
Lymphocytes Absolute: 2.7 10*3/uL (ref 0.7–3.1)
Lymphs: 39 %
MCH: 22.4 pg — ABNORMAL LOW (ref 26.6–33.0)
MCHC: 31.2 g/dL — ABNORMAL LOW (ref 31.5–35.7)
MCV: 72 fL — ABNORMAL LOW (ref 79–97)
Monocytes Absolute: 0.5 10*3/uL (ref 0.1–0.9)
Monocytes: 7 %
Neutrophils Absolute: 3.5 10*3/uL (ref 1.4–7.0)
Neutrophils: 50 %
RBC: 5.67 x10E6/uL (ref 4.14–5.80)
RDW: 14.9 % (ref 11.6–15.4)
WBC: 6.8 10*3/uL (ref 3.4–10.8)

## 2020-08-21 LAB — LIPASE: Lipase: 107 U/L — ABNORMAL HIGH (ref 13–78)

## 2020-08-21 NOTE — Addendum Note (Signed)
Addended by: Maryla Morrow on: 08/21/2020 05:02 PM   Modules accepted: Orders

## 2020-08-23 ENCOUNTER — Encounter: Payer: Self-pay | Admitting: Osteopathic Medicine

## 2020-08-23 ENCOUNTER — Ambulatory Visit (INDEPENDENT_AMBULATORY_CARE_PROVIDER_SITE_OTHER): Payer: BC Managed Care – PPO

## 2020-08-23 ENCOUNTER — Other Ambulatory Visit: Payer: Self-pay

## 2020-08-23 ENCOUNTER — Telehealth: Payer: Self-pay | Admitting: Physician Assistant

## 2020-08-23 DIAGNOSIS — R1013 Epigastric pain: Secondary | ICD-10-CM | POA: Diagnosis not present

## 2020-08-23 DIAGNOSIS — R748 Abnormal levels of other serum enzymes: Secondary | ICD-10-CM | POA: Diagnosis not present

## 2020-08-23 DIAGNOSIS — I700 Atherosclerosis of aorta: Secondary | ICD-10-CM | POA: Diagnosis not present

## 2020-08-23 DIAGNOSIS — K5730 Diverticulosis of large intestine without perforation or abscess without bleeding: Secondary | ICD-10-CM | POA: Diagnosis not present

## 2020-08-23 DIAGNOSIS — K5731 Diverticulosis of large intestine without perforation or abscess with bleeding: Secondary | ICD-10-CM

## 2020-08-23 DIAGNOSIS — K7689 Other specified diseases of liver: Secondary | ICD-10-CM | POA: Diagnosis not present

## 2020-08-23 DIAGNOSIS — N281 Cyst of kidney, acquired: Secondary | ICD-10-CM | POA: Diagnosis not present

## 2020-08-23 LAB — GI PROFILE, STOOL, PCR

## 2020-08-23 LAB — CLOSTRIDIUM DIFFICILE EIA: C difficile Toxins A+B, EIA: NEGATIVE

## 2020-08-23 MED ORDER — IOHEXOL 300 MG/ML  SOLN
100.0000 mL | Freq: Once | INTRAMUSCULAR | Status: AC | PRN
  Administered 2020-08-23: 100 mL via INTRAVENOUS

## 2020-08-23 MED ORDER — METRONIDAZOLE 500 MG PO TABS: 500.0000 mg | ORAL_TABLET | Freq: Three times a day (TID) | ORAL | 0 refills | Status: DC

## 2020-08-23 MED ORDER — CIPROFLOXACIN HCL 500 MG PO TABS: 500.0000 mg | ORAL_TABLET | Freq: Two times a day (BID) | ORAL | 0 refills | Status: DC

## 2020-08-23 NOTE — Addendum Note (Signed)
Addended by: Maryla Morrow on: 08/23/2020 09:03 AM   Modules accepted: Orders

## 2020-08-23 NOTE — Telephone Encounter (Signed)
Called and spoke to patient CT scan negative Explained diverticulosis finding - encouraged to increase dietary fiber Counseled NOT to start antibiotics Counseled to follow dietary recommendations from Dr. Sheppard Coil and to continue Linzess 145 mcg every other day  All questions answered Keep follow-up appointment with PCP

## 2020-08-26 ENCOUNTER — Encounter: Payer: Self-pay | Admitting: Osteopathic Medicine

## 2020-08-27 LAB — H. PYLORI ANTIGEN, STOOL: H pylori Ag, Stl: NEGATIVE

## 2020-08-27 LAB — FECAL LEUKOCYTES

## 2020-08-28 NOTE — Addendum Note (Signed)
Addended by: Maryla Morrow on: 08/28/2020 10:46 AM   Modules accepted: Orders

## 2020-09-30 ENCOUNTER — Other Ambulatory Visit: Payer: Self-pay | Admitting: Osteopathic Medicine

## 2020-11-07 ENCOUNTER — Ambulatory Visit (INDEPENDENT_AMBULATORY_CARE_PROVIDER_SITE_OTHER): Payer: BC Managed Care – PPO | Admitting: Osteopathic Medicine

## 2020-11-07 ENCOUNTER — Encounter: Payer: Self-pay | Admitting: Osteopathic Medicine

## 2020-11-07 ENCOUNTER — Other Ambulatory Visit: Payer: Self-pay

## 2020-11-07 VITALS — BP 144/86 | HR 67 | Temp 98.2°F | Wt 172.1 lb

## 2020-11-07 DIAGNOSIS — I10 Essential (primary) hypertension: Secondary | ICD-10-CM

## 2020-11-07 DIAGNOSIS — Z23 Encounter for immunization: Secondary | ICD-10-CM | POA: Diagnosis not present

## 2020-11-07 DIAGNOSIS — J4520 Mild intermittent asthma, uncomplicated: Secondary | ICD-10-CM

## 2020-11-07 DIAGNOSIS — E559 Vitamin D deficiency, unspecified: Secondary | ICD-10-CM

## 2020-11-07 DIAGNOSIS — I152 Hypertension secondary to endocrine disorders: Secondary | ICD-10-CM

## 2020-11-07 DIAGNOSIS — E1169 Type 2 diabetes mellitus with other specified complication: Secondary | ICD-10-CM | POA: Diagnosis not present

## 2020-11-07 DIAGNOSIS — E1159 Type 2 diabetes mellitus with other circulatory complications: Secondary | ICD-10-CM

## 2020-11-07 DIAGNOSIS — E119 Type 2 diabetes mellitus without complications: Secondary | ICD-10-CM | POA: Diagnosis not present

## 2020-11-07 DIAGNOSIS — E785 Hyperlipidemia, unspecified: Secondary | ICD-10-CM

## 2020-11-07 DIAGNOSIS — Z8619 Personal history of other infectious and parasitic diseases: Secondary | ICD-10-CM | POA: Insufficient documentation

## 2020-11-07 DIAGNOSIS — D560 Alpha thalassemia: Secondary | ICD-10-CM

## 2020-11-07 DIAGNOSIS — K219 Gastro-esophageal reflux disease without esophagitis: Secondary | ICD-10-CM

## 2020-11-07 LAB — POCT GLYCOSYLATED HEMOGLOBIN (HGB A1C): Hemoglobin A1C: 5.9 % — AB (ref 4.0–5.6)

## 2020-11-07 MED ORDER — OMEPRAZOLE 40 MG PO CPDR: 40.0000 mg | DELAYED_RELEASE_CAPSULE | Freq: Every day | ORAL | 3 refills | Status: DC

## 2020-11-07 MED ORDER — METFORMIN HCL ER 500 MG PO TB24: 1000.0000 mg | ORAL_TABLET | Freq: Every day | ORAL | 3 refills | Status: DC

## 2020-11-07 MED ORDER — FLUTICASONE PROPIONATE 50 MCG/ACT NA SUSP: NASAL | 11 refills | Status: DC

## 2020-11-07 MED ORDER — ATORVASTATIN CALCIUM 20 MG PO TABS: 20.0000 mg | ORAL_TABLET | Freq: Every day | ORAL | 3 refills | Status: DC

## 2020-11-07 MED ORDER — AMLODIPINE BESYLATE 10 MG PO TABS: 10.0000 mg | ORAL_TABLET | Freq: Every day | ORAL | 3 refills | Status: DC

## 2020-11-07 MED ORDER — OZEMPIC (1 MG/DOSE) 4 MG/3ML ~~LOC~~ SOPN: 1.0000 mg | PEN_INJECTOR | SUBCUTANEOUS | 3 refills | Status: DC

## 2020-11-07 MED ORDER — OZEMPIC (0.25 OR 0.5 MG/DOSE) 2 MG/1.5ML ~~LOC~~ SOPN: 0.5000 mg | PEN_INJECTOR | SUBCUTANEOUS | 0 refills | Status: DC

## 2020-11-07 MED ORDER — CETIRIZINE HCL 10 MG PO TABS: 10.0000 mg | ORAL_TABLET | Freq: Every day | ORAL | 3 refills | Status: DC

## 2020-11-07 MED ORDER — VALSARTAN 160 MG PO TABS: ORAL_TABLET | ORAL | 3 refills | Status: DC

## 2020-11-07 NOTE — Progress Notes (Signed)
Justin Frank is a 65 y.o. male who presents to  [place] at [facility]  today, 11/07/20, seeking care for the following:  Monitor chronic conditions, wrap-up prior to me leaving practice here - see below       [place] with other pertinent findings:  The primary encounter diagnosis was Controlled type 2 diabetes mellitus without complication, without long-term current use of insulin ([place]). Diagnoses of Hyperlipidemia associated with type 2 diabetes mellitus ([place]), Hypertension associated with diabetes ([place]), Mild intermittent asthma without complication, Vitamin D deficiency, Alpha-thalassemia ([place]), Gastroesophageal reflux disease, unspecified whether esophagitis present, History of Helicobacter pylori infection, Need for influenza vaccination, and Essential hypertension were also pertinent to this visit.   1. Controlled type 2 diabetes mellitus without complication, without long-term current use of insulin (HCC) A1C under great control Ozempic has been on back order, he's been out at any rate, will send Bydureon today  2. Hyperlipidemia associated with type 2 diabetes mellitus ([place]) Lipids pending   3. Hypertension associated with diabetes ([place]) BP Readings from Last 3 Encounters:  11/07/20 (!) 144/86  08/20/20 126/73  06/13/20 (!) 147/83  BP a bit above goal today, monitor at home   4. Mild intermittent asthma without complication No concerns   5. Vitamin D deficiency Labs pending  6. Alpha-thalassemia ([place]) CBC pending, stable  7. Gastroesophageal reflux disease, unspecified whether esophagitis present 8. History of Helicobacter pylori infection Reports doing well, no significant acid reflux today, may consider stopping PPI next visit   9. Need for influenza vaccination Done today      Patient Instructions  Meds refilled for a year! Ozempic refilled but has been a shortage of this medication. If there's  none at the pharmacy they should be able to suggest a covered alternative  A1C looks great at 5.9!   Orders Placed This Encounter  Procedures   Flu Vaccine QUAD 6+ mos PF IM (Fluarix Quad PF)   CBC   COMPLETE METABOLIC PANEL WITH GFR   Lipid panel   VITAMIN D 25 Hydroxy (Vit-D Deficiency, Fractures)   POCT HgB A1C   Results for orders placed or performed in visit on 11/07/20 (from the past 24 hour(s))  CBC     Status: Abnormal   Collection Time: 11/07/20 12:00 AM  Result Value Ref Range   WBC 5.9 3.8 - 10.8 Thousand/uL   RBC 6.02 (H) 4.20 - 5.80 Million/uL   Hemoglobin 13.6 13.2 - 17.1 g/dL   HCT 43.9 38.5 - 50.0 %   MCV 72.9 (L) 80.0 - 100.0 fL   MCH 22.6 (L) 27.0 - 33.0 pg   MCHC 31.0 (L) 32.0 - 36.0 g/dL   RDW 15.0 11.0 - 15.0 %   Platelets 251 140 - 400 Thousand/uL   MPV 10.0 7.5 - 12.5 fL  POCT HgB A1C     Status: Abnormal   Collection Time: 11/07/20  8:04 AM  Result Value Ref Range   Hemoglobin A1C 5.9 (A) 4.0 - 5.6 %   HbA1c POC (<> result, manual entry)     HbA1c, POC (prediabetic range)     HbA1c, POC (controlled diabetic range)       Meds ordered this encounter  Medications   metFORMIN (GLUCOPHAGE-XR) 500 MG 24 hr tablet    Sig: Take 2 tablets (1,000 mg total) by mouth daily with breakfast.    Dispense:  180 tablet    Refill:  3    Cancel 500  mg daily dose thanks   omeprazole (PRILOSEC) 40 MG capsule    Sig: Take 1 capsule (40 mg total) by mouth daily.    Dispense:  90 capsule    Refill:  3   cetirizine (ZYRTEC) 10 MG tablet    Sig: Take 1 tablet (10 mg total) by mouth daily.    Dispense:  90 tablet    Refill:  3   valsartan (DIOVAN) 160 MG tablet    Sig: TAKE 1 TABLET(160 MG) BY MOUTH DAILY    Dispense:  90 tablet    Refill:  3   amLODipine (NORVASC) 10 MG tablet    Sig: Take 1 tablet (10 mg total) by mouth daily.    Dispense:  90 tablet    Refill:  3   atorvastatin (LIPITOR) 20 MG tablet    Sig: Take 1 tablet (20 mg total) by mouth daily.     Dispense:  90 tablet    Refill:  3   Semaglutide, 1 MG/DOSE, (OZEMPIC, 1 MG/DOSE,) 4 MG/3ML SOPN    Sig: Inject 1 mg into the skin once a week.    Dispense:  9 mL    Refill:  3   fluticasone (FLONASE) 50 MCG/ACT nasal spray    Sig: Place 1 spray in each nostril once daily.    Dispense:  48 g    Refill:  11   Semaglutide,0.25 or 0.5MG /DOS, (OZEMPIC, 0.25 OR 0.5 MG/DOSE,) 2 MG/1.5ML SOPN    Sig: Inject 0.5 mg into the skin once a week. For 2-4 weeks then increase to 1 mg weekly    Dispense:  1.5 mL    Refill:  0      See below for relevant physical exam findings  See below for recent lab and imaging results reviewed  Medications, allergies, PMH, PSH, SocH, FamH reviewed below    Follow-up instructions: Return in about 6 months (around 05/07/2021) for establish with new provider, see [place] sooner as needed! .                                        Exam:  BP (!) 144/86 (BP Location: Left Arm, Patient Position: Sitting, Cuff Size: Normal)   Pulse 67   Temp 98.2 F (36.8 C) (Oral)   Wt 172 lb 1.9 oz (78.1 kg)   BMI 29.54 kg/m  Constitutional: VS see above. General Appearance: alert, well-developed, well-nourished, NAD Neck: No masses, trachea midline.  Respiratory: Normal respiratory effort. no wheeze, no rhonchi, no rales Cardiovascular: S1/S2 normal, no murmur, no rub/gallop auscultated. RRR.  Musculoskeletal: Gait normal. Symmetric and independent movement of all extremities Abdominal: non-tender, non-distended, no appreciable organomegaly, neg Murphy's, BS WNLx4 Neurological: Normal balance/coordination. No tremor. Skin: warm, dry, intact.  Psychiatric: Normal judgment/insight. Normal mood and affect. Oriented x3.   Current Meds  Medication Sig   hydrOXYzine (ATARAX/VISTARIL) 25 MG tablet TAKE 1 TABLET(25 MG) BY MOUTH EVERY 6 HOURS AS NEEDED FOR ANXIETY OR SLEEP   linaclotide (LINZESS) 145 MCG CAPS capsule Take 1 capsule (145 mcg total) by  mouth daily before breakfast.   Semaglutide,0.25 or 0.5MG /DOS, (OZEMPIC, 0.25 OR 0.5 MG/DOSE,) 2 MG/1.5ML SOPN Inject 0.5 mg into the skin once a week. For 2-4 weeks then increase to 1 mg weekly   [DISCONTINUED] amLODipine (NORVASC) 10 MG tablet TAKE 1 TABLET(10 MG) BY MOUTH DAILY   [DISCONTINUED] atorvastatin (LIPITOR) 20 MG tablet Take  1 tablet (20 mg total) by mouth daily.   [DISCONTINUED] cetirizine (ZYRTEC) 10 MG tablet Take 1 tablet (10 mg total) by mouth daily.   [DISCONTINUED] metFORMIN (GLUCOPHAGE-XR) 500 MG 24 hr tablet Take 2 tablets (1,000 mg total) by mouth daily with breakfast.   [DISCONTINUED] omeprazole (PRILOSEC) 40 MG capsule TAKE 1 CAPSULE(40 MG) BY MOUTH DAILY   [DISCONTINUED] OZEMPIC, 1 MG/DOSE, 4 MG/3ML SOPN INJECT 1 MG UNDER THE SKIN ONCE A WEEK   [DISCONTINUED] valsartan (DIOVAN) 160 MG tablet TAKE 1 TABLET(160 MG) BY MOUTH DAILY    Allergies  Allergen Reactions   Atorvastatin Swelling    " lip swelling "     Lisinopril Cough    Other reaction(s): Cough (ALLERGY/intolerance)    Patient Active Problem List   Diagnosis Date Noted   History of Helicobacter pylori infection 11/07/2020   Gastroesophageal reflux disease 11/07/2020   Diverticulosis large intestine w/o perforation or abscess w/bleeding 08/23/2020   Primary osteoarthritis of left knee 04/18/2019   Primary osteoarthritis of right hip 04/18/2019   Pes planus 04/11/2019   Left lumbar radiculitis 02/06/2019   Onychomycosis of multiple toenails with type 2 diabetes mellitus ([place]) 09/14/2018   BMI 35.0-35.9,adult 06/14/2017   History of asbestos exposure 04/29/2017   Pleural plaque 04/28/2017   Mild intermittent asthma without complication 37/85/8850   Bronchiectasis without complication ([place]) 27/74/1287   Controlled type 2 diabetes mellitus without complication, without long-term current use of insulin ([place]) 03/22/2017   Vitamin D deficiency 03/16/2017   Class 2 obesity in adult 02/15/2017    Lymphadenopathy, mediastinal 02/15/2017   RBC microcytosis 02/15/2017   Alpha-thalassemia ([place]) 02/15/2017   Essential hypertension 05/15/2014   Hyperlipidemia 05/15/2014   Recurrent umbilical hernia 86/76/7209    Family History  Problem Relation Age of Onset   Hypertension Mother    Hypertension Father    Colon cancer Neg Hx    Esophageal cancer Neg Hx    Rectal cancer Neg Hx    Stomach cancer Neg Hx     Social History   Tobacco Use  Smoking Status Former   Types: Cigarettes   Quit date: 03/02/1977   Years since quitting: 43.7  Smokeless Tobacco Never    Past Surgical History:  Procedure Laterality Date   COLONOSCOPY     HERNIA REPAIR     As per patient - 2012, 2013, 2015    Immunization History  Administered Date(s) Administered   Influenza, Seasonal, Injecte, Preservative Fre 03/15/2017   Influenza,inj,Quad PF,6+ Mos 03/15/2017, 01/03/2018, 12/21/2018, 01/24/2020, 11/07/2020   Moderna Sars-Covid-2 Vaccination 05/10/2019, 06/07/2019   Pneumococcal Polysaccharide-23 06/14/2017   Pneumococcal-Unspecified 06/14/2017   Tdap 06/14/2017   Zoster Recombinat (Shingrix) 03/22/2019, 06/26/2019    Recent Results (from the past 2160 hour(s))  CBC With Differential     Status: Abnormal   Collection Time: 08/20/20  4:18 PM  Result Value Ref Range   WBC 6.8 3.4 - 10.8 x10E3/uL   RBC 5.67 4.14 - 5.80 x10E6/uL   Hemoglobin 12.7 (L) 13.0 - 17.7 g/dL   Hematocrit 40.7 37.5 - 51.0 %   MCV 72 (L) 79 - 97 fL   MCH 22.4 (L) 26.6 - 33.0 pg   MCHC 31.2 (L) 31.5 - 35.7 g/dL   RDW 14.9 11.6 - 15.4 %   Neutrophils 50 Not Estab. %   Lymphs 39 Not Estab. %   Monocytes 7 Not Estab. %   Eos 3 Not Estab. %   Basos 1 Not Estab. %   Neutrophils  Absolute 3.5 1.4 - 7.0 x10E3/uL   Lymphocytes Absolute 2.7 0.7 - 3.1 x10E3/uL   Monocytes Absolute 0.5 0.1 - 0.9 x10E3/uL   EOS (ABSOLUTE) 0.2 0.0 - 0.4 x10E3/uL   Basophils Absolute 0.1 0.0 - 0.2 x10E3/uL   Immature Granulocytes 0 Not Estab.  %   Immature Grans (Abs) 0.0 0.0 - 0.1 x10E3/uL  Lipase     Status: Abnormal   Collection Time: 08/20/20  4:18 PM  Result Value Ref Range   Lipase 107 (H) 13 - 78 U/L  CMP14+EGFR     Status: None   Collection Time: 08/20/20  4:18 PM  Result Value Ref Range   Glucose 90 65 - 99 mg/dL   BUN 15 8 - 27 mg/dL   Creatinine, Ser 0.98 0.76 - 1.27 mg/dL   eGFR 87 >59 mL/min/1.73   BUN/Creatinine Ratio 15 10 - 24   Sodium 143 134 - 144 mmol/L   Potassium 4.0 3.5 - 5.2 mmol/L   Chloride 104 96 - 106 mmol/L   CO2 20 20 - 29 mmol/L   Calcium 9.0 8.6 - 10.2 mg/dL   Total Protein 6.4 6.0 - 8.5 g/dL   Albumin 4.4 3.8 - 4.8 g/dL   Globulin, Total 2.0 1.5 - 4.5 g/dL   Albumin/Globulin Ratio 2.2 1.2 - 2.2   Bilirubin Total 0.4 0.0 - 1.2 mg/dL   Alkaline Phosphatase 86 44 - 121 IU/L   AST 21 0 - 40 IU/L   ALT 17 0 - 44 IU/L  Fecal leukocytes     Status: None   Collection Time: 08/21/20  2:30 PM   Specimen: Stool   ST     CD- [id number]  Result Value Ref Range   White Blood Cells (WBC), Stool Final report None Seen   Result 1 Comment     Comment: No white blood cells seen.  H. pylori antigen, stool     Status: None   Collection Time: 08/21/20  2:30 PM  Result Value Ref Range   H pylori Ag, Stl Negative Negative  Stool C-Diff Toxin Assay     Status: None   Collection Time: 08/21/20  2:30 PM   Specimen: Stool   ST  Result Value Ref Range   C difficile Toxins A+B, EIA Negative Negative  GI Profile, Stool, PCR     Status: None   Collection Time: 08/21/20  2:30 PM  Result Value Ref Range   Campylobacter Not Detected Not Detected   C difficile toxin A/B Not Detected Not Detected   Plesiomonas shigelloides Not Detected Not Detected   Salmonella Not Detected Not Detected   Vibrio Not Detected Not Detected   Vibrio cholerae Not Detected Not Detected   Yersinia enterocolitica Not Detected Not Detected   Enteroaggregative E coli Not Detected Not Detected   Enteropathogenic E coli Not Detected  Not Detected   Enterotoxigenic E coli Not Detected Not Detected   Shiga-toxin-producing E coli Not Detected Not Detected   E coli B510 Not applicable Not Detected   Shigella/Enteroinvasive E coli Not Detected Not Detected   Cryptosporidium Not Detected Not Detected   Cyclospora cayetanensis Not Detected Not Detected   Entamoeba histolytica Not Detected Not Detected   Giardia lamblia Not Detected Not Detected   Adenovirus F 40/41 Not Detected Not Detected   Astrovirus Not Detected Not Detected   Norovirus GI/GII Not Detected Not Detected   Rotavirus A Not Detected Not Detected   Sapovirus Not Detected Not Detected  CBC  Status: Abnormal   Collection Time: 11/07/20 12:00 AM  Result Value Ref Range   WBC 5.9 3.8 - 10.8 Thousand/uL   RBC 6.02 (H) 4.20 - 5.80 Million/uL   Hemoglobin 13.6 13.2 - 17.1 g/dL   HCT 43.9 38.5 - 50.0 %   MCV 72.9 (L) 80.0 - 100.0 fL   MCH 22.6 (L) 27.0 - 33.0 pg   MCHC 31.0 (L) 32.0 - 36.0 g/dL   RDW 15.0 11.0 - 15.0 %   Platelets 251 140 - 400 Thousand/uL   MPV 10.0 7.5 - 12.5 fL  POCT HgB A1C     Status: Abnormal   Collection Time: 11/07/20  8:04 AM  Result Value Ref Range   Hemoglobin A1C 5.9 (A) 4.0 - 5.6 %   HbA1c POC (<> result, manual entry)     HbA1c, POC (prediabetic range)     HbA1c, POC (controlled diabetic range)      No results found.     All questions at time of visit were answered - patient instructed to contact office with any additional concerns or updates. ER/RTC precautions were reviewed with the patient as applicable.   Please note: manual typing as well as voice recognition software may have been used to produce this document - typos may escape review. Please contact Dr. Sheppard Coil for any needed clarifications.

## 2020-11-07 NOTE — Patient Instructions (Addendum)
Meds refilled for a year! Ozempic refilled but has been a shortage of this medication. If there's none at the pharmacy they should be able to suggest a covered alternative  A1C looks great at 5.9!

## 2020-11-08 ENCOUNTER — Other Ambulatory Visit: Payer: Self-pay | Admitting: Osteopathic Medicine

## 2020-11-08 LAB — COMPLETE METABOLIC PANEL WITH GFR
AG Ratio: 1.7 (calc) (ref 1.0–2.5)
ALT: 27 U/L (ref 9–46)
AST: 23 U/L (ref 10–35)
Albumin: 4.5 g/dL (ref 3.6–5.1)
Alkaline phosphatase (APISO): 80 U/L (ref 35–144)
BUN: 19 mg/dL (ref 7–25)
CO2: 24 mmol/L (ref 20–32)
Calcium: 9.4 mg/dL (ref 8.6–10.3)
Chloride: 104 mmol/L (ref 98–110)
Creat: 1.01 mg/dL (ref 0.70–1.35)
Globulin: 2.6 g/dL (calc) (ref 1.9–3.7)
Glucose, Bld: 104 mg/dL — ABNORMAL HIGH (ref 65–99)
Potassium: 4.4 mmol/L (ref 3.5–5.3)
Sodium: 141 mmol/L (ref 135–146)
Total Bilirubin: 0.7 mg/dL (ref 0.2–1.2)
Total Protein: 7.1 g/dL (ref 6.1–8.1)
eGFR: 84 mL/min/{1.73_m2} (ref >=60)

## 2020-11-08 LAB — LIPID PANEL
Cholesterol: 159 mg/dL (ref <200)
HDL: 69 mg/dL (ref >=40)
LDL Cholesterol (Calc): 78 mg/dL (calc)
Non-HDL Cholesterol (Calc): 90 mg/dL (calc) (ref <130)
Total CHOL/HDL Ratio: 2.3 (calc) (ref <5.0)
Triglycerides: 43 mg/dL (ref <150)

## 2020-11-08 LAB — CBC
HCT: 43.9 % (ref 38.5–50.0)
Hemoglobin: 13.6 g/dL (ref 13.2–17.1)
MCH: 22.6 pg — ABNORMAL LOW (ref 27.0–33.0)
MCHC: 31 g/dL — ABNORMAL LOW (ref 32.0–36.0)
MCV: 72.9 fL — ABNORMAL LOW (ref 80.0–100.0)
MPV: 10 fL (ref 7.5–12.5)
Platelets: 251 10*3/uL (ref 140–400)
RBC: 6.02 10*6/uL — ABNORMAL HIGH (ref 4.20–5.80)
RDW: 15 % (ref 11.0–15.0)
WBC: 5.9 10*3/uL (ref 3.8–10.8)

## 2020-11-08 LAB — VITAMIN D 25 HYDROXY (VIT D DEFICIENCY, FRACTURES): Vit D, 25-Hydroxy: 12 ng/mL — ABNORMAL LOW (ref 30–100)

## 2020-11-19 ENCOUNTER — Ambulatory Visit: Payer: BC Managed Care – PPO | Admitting: Osteopathic Medicine

## 2020-11-22 ENCOUNTER — Ambulatory Visit (INDEPENDENT_AMBULATORY_CARE_PROVIDER_SITE_OTHER): Payer: BC Managed Care – PPO

## 2020-11-22 ENCOUNTER — Ambulatory Visit (INDEPENDENT_AMBULATORY_CARE_PROVIDER_SITE_OTHER): Payer: BC Managed Care – PPO | Admitting: Sports Medicine

## 2020-11-22 ENCOUNTER — Other Ambulatory Visit: Payer: Self-pay

## 2020-11-22 DIAGNOSIS — M5416 Radiculopathy, lumbar region: Secondary | ICD-10-CM | POA: Diagnosis not present

## 2020-11-22 DIAGNOSIS — M1611 Unilateral primary osteoarthritis, right hip: Secondary | ICD-10-CM | POA: Diagnosis not present

## 2020-11-22 NOTE — Assessment & Plan Note (Signed)
Alta returns, he is starting to have increasing paresthesias into the left anterolateral thigh, historically he has done really well with left L4-L5 interlaminar epidurals, we are going to do another epidural, left L4-L5 transforaminal. Return as needed for this.

## 2020-11-22 NOTE — Progress Notes (Signed)
    Procedures performed today:    Procedure: Real-time Ultrasound Guided injection of the right hip joint Device: Samsung HS60  Verbal informed consent obtained.  Time-out conducted.  Noted no overlying erythema, induration, or other signs of local infection.  Skin prepped in a sterile fashion.  Local anesthesia: Topical Ethyl chloride.  With sterile technique and under real time ultrasound guidance:  Noted arthritic hip, 1 cc Kenalog 40, 2 cc lidocaine, 2 cc bupivacaine injected easily Completed without difficulty  Advised to call if fevers/chills, erythema, induration, drainage, or persistent bleeding.  Images permanently stored and available for review in PACS.  Impression: Technically successful ultrasound guided injection.  Independent interpretation of notes and tests performed by another provider:   None.  Brief History, Exam, Impression, and Recommendations:    Primary osteoarthritis of right hip Pleasant 65 year old male, last injection for right hip osteoarthritis was in February, recurrence of pain, repeated today. Return as needed.  Left lumbar radiculitis [place] returns, he is starting to have increasing paresthesias into the left anterolateral thigh, historically he has done really well with left L4-L5 interlaminar epidurals, we are going to do another epidural, left L4-L5 transforaminal. Return as needed for this.  Chronic process with exacerbation and pharmacologic intervention.  ___________________________________________ Gwen Her. Dianah Field, M.D., ABFM., CAQSM. Primary Care and [place] Instructor of [employer] [facility] [employer]

## 2020-11-22 NOTE — Assessment & Plan Note (Signed)
Pleasant 66 year old male, last injection for right hip osteoarthritis was in February, recurrence of pain, repeated today. Return as needed.

## 2020-12-12 ENCOUNTER — Other Ambulatory Visit: Payer: Self-pay | Admitting: Sports Medicine

## 2020-12-12 ENCOUNTER — Other Ambulatory Visit: Payer: Self-pay

## 2020-12-12 ENCOUNTER — Ambulatory Visit
Admission: RE | Admit: 2020-12-12 | Discharge: 2020-12-12 | Disposition: A | Discharge status: Home / Self Care | Payer: BC Managed Care – PPO | Source: Ambulatory Visit | Attending: Sports Medicine | Admitting: Sports Medicine

## 2020-12-12 DIAGNOSIS — M5416 Radiculopathy, lumbar region: Secondary | ICD-10-CM

## 2020-12-12 DIAGNOSIS — M47817 Spondylosis without myelopathy or radiculopathy, lumbosacral region: Secondary | ICD-10-CM | POA: Diagnosis not present

## 2020-12-12 MED ORDER — IOPAMIDOL (ISOVUE-M 200) INJECTION 41%
1.0000 mL | Freq: Once | INTRAMUSCULAR | Status: AC
  Administered 2020-12-12: 1 mL via EPIDURAL

## 2020-12-12 MED ORDER — METHYLPREDNISOLONE ACETATE 40 MG/ML INJ SUSP (RADIOLOG
80.0000 mg | Freq: Once | INTRAMUSCULAR | Status: AC
  Administered 2020-12-12: 80 mg via EPIDURAL

## 2020-12-12 NOTE — Discharge Instructions (Signed)

## 2021-01-31 ENCOUNTER — Encounter: Payer: Self-pay | Admitting: Family Medicine

## 2021-01-31 ENCOUNTER — Ambulatory Visit (INDEPENDENT_AMBULATORY_CARE_PROVIDER_SITE_OTHER): Payer: BC Managed Care – PPO | Admitting: Family Medicine

## 2021-01-31 ENCOUNTER — Other Ambulatory Visit: Payer: Self-pay

## 2021-01-31 VITALS — BP 136/80 | HR 83 | Temp 98.2°F | Ht 64.0 in | Wt 167.0 lb

## 2021-01-31 DIAGNOSIS — R143 Flatulence: Secondary | ICD-10-CM

## 2021-01-31 NOTE — Progress Notes (Signed)
Acute Office Visit  Subjective:    Patient ID: Justin Frank, male    DOB: 06/22/1955, 65 y.o.   MRN: [id number]  Chief Complaint  Patient presents with   Gas    HPI Patient is in today for flatulence.  Patient reports he has had some GI concerns for the past 7 to 8 months.  Back in June he came in to see Dr. Sheppard Coil with excessive belching, indigestion.  She did thorough work-up and stool cultures were negative, CT scan was normal.  He was started on omeprazole and instructed to start with a clear liquid diet and gradually progress as tolerated.  Patient reports that helped significantly and the belching/indigestion has not returned.  However, he is now complaining of excessive flatulence.  Reports that he will be extremely gassy for at least 10 to 15 minutes after meals and then continues to have at least 4-5 episodes of passing gas every hour for the majority of the day.  Reports it smells bad.  States he is having at least 1 bowel movement every day.  Bowel movement is soft, brown, no mucus, no blood.  States he does think the diameter of his stool is smaller than it used to be.  He denies any abdominal pain, fevers, nausea, vomiting, diarrhea, constipation, belching.  States he is lactose intolerant but he is very careful about avoiding this.  He tries to eat a very balanced diet.  States he typically has black coffee, fruit, protein, occasional vegetables.  States he does not eat a lot of gas causing foods.  States he tends to eat relatively small meals throughout the day.  Flatulence does not change based on meals.  He did have a colonoscopy in 2021 with some precancerous polyps removed.  Recommended 3-year follow-up colonoscopy.  Past Medical History:  Diagnosis Date   Allergy    Asthma    Controlled type 2 diabetes mellitus without complication, without long-term current use of insulin ([place]) 03/22/2017   ED (erectile dysfunction)    GERD (gastroesophageal reflux disease)     Hyperlipidemia    Hypertension     Past Surgical History:  Procedure Laterality Date   COLONOSCOPY     HERNIA REPAIR     As per patient - 2012, 2013, 2015    Family History  Problem Relation Age of Onset   Hypertension Mother    Hypertension Father    Colon cancer Neg Hx    Esophageal cancer Neg Hx    Rectal cancer Neg Hx    Stomach cancer Neg Hx     Social History   Socioeconomic History   Marital status: Married    Spouse name: Willa   Number of children: 8   Years of education: 12   Highest education level: High school graduate  Occupational History   Occupation: Chef  Tobacco Use   Smoking status: Former    Types: Cigarettes    Quit date: 03/02/1977    Years since quitting: 43.9   Smokeless tobacco: Never  Vaping Use   Vaping Use: Never used  Substance and Sexual Activity   Alcohol use: No   Drug use: No   Sexual activity: Yes    Partners: Female    Birth control/protection: None  Other Topics Concern   Not on file  Social History Narrative   Not on file   Social Determinants of Health   Financial Resource Strain: Not on file  Food Insecurity: Not on file  Transportation  Needs: Not on file  Physical Activity: Not on file  Stress: Not on file  Social Connections: Not on file  Intimate Partner Violence: Not on file    Outpatient Medications Prior to Visit  Medication Sig Dispense Refill   amLODipine (NORVASC) 10 MG tablet Take 1 tablet (10 mg total) by mouth daily. 90 tablet 3   atorvastatin (LIPITOR) 20 MG tablet Take 1 tablet (20 mg total) by mouth daily. 90 tablet 3   cetirizine (ZYRTEC) 10 MG tablet Take 1 tablet (10 mg total) by mouth daily. 90 tablet 3   fluticasone (FLONASE) 50 MCG/ACT nasal spray Place 1 spray in each nostril once daily. 48 g 11   hydrOXYzine (ATARAX/VISTARIL) 25 MG tablet TAKE 1 TABLET(25 MG) BY MOUTH EVERY 6 HOURS AS NEEDED FOR ANXIETY OR SLEEP 90 tablet 1   linaclotide (LINZESS) 145 MCG CAPS capsule Take 1 capsule (145  mcg total) by mouth daily before breakfast. 90 capsule 1   metFORMIN (GLUCOPHAGE-XR) 500 MG 24 hr tablet Take 2 tablets (1,000 mg total) by mouth daily with breakfast. 180 tablet 3   omeprazole (PRILOSEC) 40 MG capsule Take 1 capsule (40 mg total) by mouth daily. 90 capsule 3   Semaglutide, 1 MG/DOSE, (OZEMPIC, 1 MG/DOSE,) 4 MG/3ML SOPN Inject 1 mg into the skin once a week. 9 mL 3   Semaglutide,0.25 or 0.5MG/DOS, (OZEMPIC, 0.25 OR 0.5 MG/DOSE,) 2 MG/1.5ML SOPN Inject 0.5 mg into the skin once a week. For 2-4 weeks then increase to 1 mg weekly 1.5 mL 0   valsartan (DIOVAN) 160 MG tablet TAKE 1 TABLET(160 MG) BY MOUTH DAILY 90 tablet 3   No facility-administered medications prior to visit.    Allergies  Allergen Reactions   Atorvastatin Swelling    " lip swelling "     Lisinopril Cough    Other reaction(s): Cough (ALLERGY/intolerance)    Review of Systems All review of systems negative except what is listed in the HPI     Objective:    Physical Exam Vitals reviewed.  Constitutional:      Appearance: Normal appearance. He is normal weight.  HENT:     Head: Normocephalic and atraumatic.  Cardiovascular:     Rate and Rhythm: Normal rate and regular rhythm.     Heart sounds: Normal heart sounds.  Pulmonary:     Effort: Pulmonary effort is normal.     Breath sounds: Normal breath sounds.  Abdominal:     General: Abdomen is flat. Bowel sounds are normal. There is no distension.     Palpations: Abdomen is soft. There is no mass.     Tenderness: There is no abdominal tenderness. There is no right CVA tenderness, left CVA tenderness, guarding or rebound.     Hernia: No hernia is present.  Neurological:     Mental Status: He is alert and oriented to person, place, and time.  Psychiatric:        Mood and Affect: Mood normal.        Behavior: Behavior normal.        Thought Content: Thought content normal.        Judgment: Judgment normal.    BP 136/80 (BP Location: Left Arm,  Patient Position: Sitting, Cuff Size: Normal)   Pulse 83   Temp 98.2 F (36.8 C) (Oral)   Ht _0  (1.626 m)   Wt 167 lb (75.8 kg)   SpO2 99%   BMI 28.67 kg/m  Wt Readings from Last 3 Encounters:  01/31/21 167 lb (75.8 kg)  11/07/20 172 lb 1.9 oz (78.1 kg)  08/20/20 174 lb 1.3 oz (79 kg)    Health Maintenance Due  Topic Date Due   HIV Screening  Never done   Hepatitis C Screening  Never done   FOOT EXAM  06/15/2018   OPHTHALMOLOGY EXAM  10/30/2020    There are no preventive care reminders to display for this patient.   Lab Results  Component Value Date   TSH 1.79 01/03/2018   Lab Results  Component Value Date   WBC 5.9 11/07/2020   HGB 13.6 11/07/2020   HCT 43.9 11/07/2020   MCV 72.9 (L) 11/07/2020   PLT 251 11/07/2020   Lab Results  Component Value Date   NA 141 11/07/2020   K 4.4 11/07/2020   CO2 24 11/07/2020   GLUCOSE 104 (H) 11/07/2020   BUN 19 11/07/2020   CREATININE 1.01 11/07/2020   BILITOT 0.7 11/07/2020   ALKPHOS 86 08/20/2020   AST 23 11/07/2020   ALT 27 11/07/2020   PROT 7.1 11/07/2020   ALBUMIN 4.4 08/20/2020   CALCIUM 9.4 11/07/2020   ANIONGAP 5 01/16/2017   EGFR 84 11/07/2020   Lab Results  Component Value Date   CHOL 159 11/07/2020   Lab Results  Component Value Date   HDL 69 11/07/2020   Lab Results  Component Value Date   LDLCALC 78 11/07/2020   Lab Results  Component Value Date   TRIG 43 11/07/2020   Lab Results  Component Value Date   CHOLHDL 2.3 11/07/2020   Lab Results  Component Value Date   HGBA1C 5.9 (A) 11/07/2020       Assessment & Plan:   1. Flatulence symptom Patient would like a GI referral.  Patient given handouts on foods to avoid including gas producing foods and encouraged to follow a low FODMAP diet (handout provided).  Discussed possibly trying some simethicone over-the-counter however informed patient that there is mixed data on efficacy of this.  Educated patient on signs symptoms requiring  further/urgent evaluation.  Continue supportive measures listed above while waiting for GI consult.   - Ambulatory referral to Gastroenterology   Follow-up if symptoms worsen or fail to improve.   Purcell Nails Olevia Bowens, DNP, FNP-C

## 2021-01-31 NOTE — Patient Instructions (Addendum)
Can try occasional over-the-counter simethicone to see if this makes a difference - mixed data.  Please review the handouts and avoid trigger foods and eat a low FODMAP diet GI referral placed

## 2021-03-12 ENCOUNTER — Ambulatory Visit (INDEPENDENT_AMBULATORY_CARE_PROVIDER_SITE_OTHER): Payer: BC Managed Care – PPO | Admitting: Medical-Surgical

## 2021-03-12 ENCOUNTER — Encounter: Payer: Self-pay | Admitting: Medical-Surgical

## 2021-03-12 ENCOUNTER — Other Ambulatory Visit: Payer: Self-pay

## 2021-03-12 VITALS — BP 106/72 | HR 87 | Resp 20 | Ht 64.0 in | Wt 162.0 lb

## 2021-03-12 DIAGNOSIS — Z7689 Persons encountering health services in other specified circumstances: Secondary | ICD-10-CM

## 2021-03-12 DIAGNOSIS — I10 Essential (primary) hypertension: Secondary | ICD-10-CM

## 2021-03-12 DIAGNOSIS — K219 Gastro-esophageal reflux disease without esophagitis: Secondary | ICD-10-CM

## 2021-03-12 DIAGNOSIS — E785 Hyperlipidemia, unspecified: Secondary | ICD-10-CM

## 2021-03-12 DIAGNOSIS — E119 Type 2 diabetes mellitus without complications: Secondary | ICD-10-CM

## 2021-03-12 DIAGNOSIS — E559 Vitamin D deficiency, unspecified: Secondary | ICD-10-CM

## 2021-03-12 MED ORDER — LINACLOTIDE 145 MCG PO CAPS: 145.0000 ug | ORAL_CAPSULE | Freq: Every day | ORAL | 1 refills | Status: DC

## 2021-03-12 NOTE — Patient Instructions (Signed)
[facility]  [address], [place], [place] [id number] [phone]

## 2021-03-12 NOTE — Progress Notes (Signed)
° °  Established patient office visit  HPI with pertinent ROS:   CC: transfer of care  HPI: Very pleasant 66 year old male presenting today to transfer care to a new PCP.  Hypertension-taking amlodipine 10 mg and valsartan 160 mg daily as prescribed, tolerating well without side effects.  Does check his blood pressure intermittently with home readings at goal. Denies CP, SOB, palpitations, lower extremity edema, dizziness, headaches, or vision changes.  Diabetes-taking metformin 1000 mg daily with breakfast as well as semaglutide 1 mg weekly.  Tolerating his medications well without side effects.  Does not check his glucose at home.  His wife assists him with eating healthy and they do try to stay active in general.  Vitamin D deficiency-does have a history of vitamin D deficiency and was previously treated with high-dose vitamin D replacement.  He has now transition to vitamin D 2000 units/day.  Is due for his next lab draw.  Hyperlipidemia-taking Lipitor 20 mg daily, tolerating well without side effects.  Following a low-fat diet.  Constipation-taking Linzess 145 mcg daily as needed.  Feels this medication works well for him but he does not need to take it every single day.  GERD-taking omeprazole 40 mg daily, tolerating well without side effects.  Feels his symptoms are well controlled.  I reviewed the past medical history, family history, social history, surgical history, and allergies today and no changes were needed.  Please see the problem list section below in epic for further details.   Brief exam, Assessment, and Plan:   Today's Vitals: BP 106/72 (BP Location: Left Arm, Patient Position: Sitting, Cuff Size: Normal)    Pulse 87    Resp 20    Ht 5\' 4"  (1.626 m)    Wt 162 lb (73.5 kg)    SpO2 98%    BMI 27.81 kg/m   1. Encounter to establish care Reviewed available information and discussed care concerns with patient.   2. Essential hypertension Blood pressure looks great at  106/72.  On exam, heart rate regular, normal rhythm.  No peripheral edema or circulatory concerns.  Continue valsartan 160 mg daily and amlodipine 10 mg daily as prescribed.  3. Controlled type 2 diabetes mellitus without complication, without long-term current use of insulin ([place]) Not due for hemoglobin A1c yet.  Continue metformin 1000 mg daily and semaglutide 1 mg weekly as prescribed.  Continue following a diabetic diet and limit concentrated sweets and simple carbohydrates.  4. Hyperlipidemia, unspecified hyperlipidemia type Continue atorvastatin 20 mg daily.  Plan to check lipid panel in 2 months at his next chronic disease follow-up.  5. Vitamin D deficiency Rechecking vitamin D today.  Continue daily supplementation with 2000 units/day.  6. Gastroesophageal reflux disease, unspecified whether esophagitis present Continue omeprazole 40 mg daily.  Can use this on an as-needed basis and we are likely able to try tapering off of the medication at his next follow-up.  Return in about 2 months (around 05/10/2021) for DM/HTN/HLD follow up. ___________________________________________ Clearnce Sorrel, DNP, APRN, FNP-BC Primary Care and [place]

## 2021-04-28 ENCOUNTER — Ambulatory Visit: Payer: BC Managed Care – PPO | Admitting: Sports Medicine

## 2021-04-28 ENCOUNTER — Other Ambulatory Visit: Payer: Self-pay

## 2021-04-28 ENCOUNTER — Ambulatory Visit (INDEPENDENT_AMBULATORY_CARE_PROVIDER_SITE_OTHER): Payer: BC Managed Care – PPO

## 2021-04-28 DIAGNOSIS — M7541 Impingement syndrome of right shoulder: Secondary | ICD-10-CM

## 2021-04-28 DIAGNOSIS — M25511 Pain in right shoulder: Secondary | ICD-10-CM | POA: Diagnosis not present

## 2021-04-28 DIAGNOSIS — M5416 Radiculopathy, lumbar region: Secondary | ICD-10-CM | POA: Diagnosis not present

## 2021-04-28 MED ORDER — GABAPENTIN 300 MG PO CAPS: ORAL_CAPSULE | ORAL | 3 refills | Status: DC

## 2021-04-28 MED ORDER — MELOXICAM 15 MG PO TABS: ORAL_TABLET | ORAL | 3 refills | Status: DC

## 2021-04-28 NOTE — Assessment & Plan Note (Signed)
Justin Frank returns, he is a pleasant 66 year old male, known lumbar DDD, historically has done well with left L4-L5 interlaminar epidurals, last epidural was in October of last year. Now having recurrence of pain, left anterior thigh with numbness, tingling. We will proceed this time with a transforaminal left L4-L5 epidural. Adding gabapentin for use in the interim.

## 2021-04-28 NOTE — Assessment & Plan Note (Signed)
Justin Frank has a history of the left shoulder rotator cuff surgery, he is now having increasing pain right shoulder, anterior lateral aspect worse with abduction. He has a positive Neer's, Hawkins, empty can signs. We discussed the anatomy and pathophysiology for Adding meloxicam, x-rays, home physical therapy, return to see me in 6 weeks, subacromial injection if not better.

## 2021-04-28 NOTE — Progress Notes (Signed)
° ° °  Procedures performed today:    None.  Independent interpretation of notes and tests performed by another provider:   None.  Brief History, Exam, Impression, and Recommendations:    Left lumbar radiculitis Justin Frank returns, he is a pleasant 66 year old male, known lumbar DDD, historically has done well with left L4-L5 interlaminar epidurals, last epidural was in October of last year. Now having recurrence of pain, left anterior thigh with numbness, tingling. We will proceed this time with a transforaminal left L4-L5 epidural. Adding gabapentin for use in the interim.  Impingement syndrome, shoulder, right Justin Frank has a history of the left shoulder rotator cuff surgery, he is now having increasing pain right shoulder, anterior lateral aspect worse with abduction. He has a positive Neer's, Hawkins, empty can signs. We discussed the anatomy and pathophysiology for Adding meloxicam, x-rays, home physical therapy, return to see me in 6 weeks, subacromial injection if not better.    ___________________________________________ Gwen Her. Dianah Field, M.D., ABFM., CAQSM. Primary Care and [place] Instructor of [employer] of [facility] of Medicine

## 2021-05-09 ENCOUNTER — Other Ambulatory Visit: Payer: Self-pay

## 2021-05-09 ENCOUNTER — Ambulatory Visit
Admission: RE | Admit: 2021-05-09 | Discharge: 2021-05-09 | Disposition: A | Discharge status: Home / Self Care | Payer: BC Managed Care – PPO | Source: Ambulatory Visit | Attending: Sports Medicine | Admitting: Sports Medicine

## 2021-05-09 DIAGNOSIS — M5416 Radiculopathy, lumbar region: Secondary | ICD-10-CM

## 2021-05-09 DIAGNOSIS — M47817 Spondylosis without myelopathy or radiculopathy, lumbosacral region: Secondary | ICD-10-CM | POA: Diagnosis not present

## 2021-05-09 MED ORDER — METHYLPREDNISOLONE ACETATE 40 MG/ML INJ SUSP (RADIOLOG
80.0000 mg | Freq: Once | INTRAMUSCULAR | Status: AC
  Administered 2021-05-09: 80 mg via EPIDURAL

## 2021-05-09 MED ORDER — IOPAMIDOL (ISOVUE-M 200) INJECTION 41%
1.0000 mL | Freq: Once | INTRAMUSCULAR | Status: AC
  Administered 2021-05-09: 1 mL via EPIDURAL

## 2021-05-09 NOTE — Discharge Instructions (Signed)

## 2021-05-15 ENCOUNTER — Ambulatory Visit: Payer: BC Managed Care – PPO | Admitting: Medical-Surgical

## 2021-05-15 ENCOUNTER — Encounter: Payer: Self-pay | Admitting: Medical-Surgical

## 2021-05-15 ENCOUNTER — Other Ambulatory Visit: Payer: Self-pay

## 2021-05-15 VITALS — BP 130/82 | HR 74 | Resp 16 | Ht 64.0 in | Wt 162.0 lb

## 2021-05-15 DIAGNOSIS — I10 Essential (primary) hypertension: Secondary | ICD-10-CM

## 2021-05-15 DIAGNOSIS — E785 Hyperlipidemia, unspecified: Secondary | ICD-10-CM

## 2021-05-15 DIAGNOSIS — R718 Other abnormality of red blood cells: Secondary | ICD-10-CM | POA: Diagnosis not present

## 2021-05-15 DIAGNOSIS — K2950 Unspecified chronic gastritis without bleeding: Secondary | ICD-10-CM

## 2021-05-15 DIAGNOSIS — E119 Type 2 diabetes mellitus without complications: Secondary | ICD-10-CM | POA: Diagnosis not present

## 2021-05-15 LAB — POCT GLYCOSYLATED HEMOGLOBIN (HGB A1C): Hemoglobin A1C: 5.3 % (ref 4.0–5.6)

## 2021-05-15 NOTE — Progress Notes (Signed)
?  HPI with pertinent ROS:  ? ?CC: DM/HTN/HLD follow-up ? ?HPI: ?Very pleasant 66 year old male presenting today for the following: ? ?Diabetes-he has not been checking his sugars at home but he has been working to make healthy choices.  He is eating more organic foods and limiting fried foods.  He does like his occasional sweets but has tried to limit this a bit.  He does walk with his wife and stays active at work all day.  Currently taking semaglutide 1 mg weekly And metformin 1000 mg every morning.  Tolerating both medications well without side effects. ? ?Hypertension-blood pressure 130/82 today.  Taking amlodipine 10 mg and 160 mg daily, tolerating well without side effects. Denies CP, SOB, palpitations, lower extremity edema, dizziness, headaches, or vision changes. ? ?HLD-taking atorvastatin 20 mg daily as prescribed, tolerating well with no side effects. ? ?GERD-taking Prilosec 40 mg daily tolerating well without side effects. ? ?I reviewed the past medical history, family history, social history, surgical history, and allergies today and no changes were needed.  Please see the problem list section below in epic for further details. ? ? ?Physical exam:  ? ?General: Well Developed, well nourished, and in no acute distress.  ?Neuro: Alert and oriented x3.  ?HEENT: Normocephalic, atraumatic. ?Skin: Warm and dry. ?Cardiac: Regular rate and rhythm, no murmurs rubs or gallops, no lower extremity edema.  ?Respiratory: Clear to auscultation bilaterally. Not using accessory muscles, speaking in full sentences. ? ?Impression and Recommendations:   ? ?1. Controlled type 2 diabetes mellitus without complication, without long-term current use of insulin ([place]) ?POCT hemoglobin A1c 5.3% today.  New semaglutide 1 mg weekly and metformin 1000 mg with breakfast daily.  Continue diabetic diet and regular intentional exercise. ?- POCT HgB A1C ? ?2. Essential hypertension ?Checking CMP.  Blood pressure at goal.  Continue  amlodipine and valsartan as prescribed. ?- COMPLETE METABOLIC PANEL WITH GFR ? ?3. Hyperlipidemia, unspecified hyperlipidemia type ?Checking lipid panel today.  Continue atorvastatin 20 mg daily. ?- Lipid panel ? ?4. RBC microcytosis ?Checking CBC. ?- CBC ? ?5. Chronic gastritis without bleeding, unspecified gastritis type ?Continue omeprazole 40 mg daily. ? ?Return in about 6 months (around 11/15/2021) for DM/HTN/HLD follow up. ?___________________________________________ ?Clearnce Sorrel, DNP, APRN, FNP-BC ?Primary Care and Sports Medicine ?[place] ?

## 2021-05-16 LAB — LIPID PANEL
Cholesterol: 158 mg/dL (ref <200)
HDL: 75 mg/dL (ref >=40)
LDL Cholesterol (Calc): 71 mg/dL (calc)
Non-HDL Cholesterol (Calc): 83 mg/dL (calc) (ref <130)
Total CHOL/HDL Ratio: 2.1 (calc) (ref <5.0)
Triglycerides: 50 mg/dL (ref <150)

## 2021-05-16 LAB — COMPLETE METABOLIC PANEL WITH GFR
AG Ratio: 1.8 (calc) (ref 1.0–2.5)
ALT: 26 U/L (ref 9–46)
AST: 20 U/L (ref 10–35)
Albumin: 4.5 g/dL (ref 3.6–5.1)
Alkaline phosphatase (APISO): 69 U/L (ref 35–144)
BUN: 16 mg/dL (ref 7–25)
CO2: 27 mmol/L (ref 20–32)
Calcium: 9.5 mg/dL (ref 8.6–10.3)
Chloride: 103 mmol/L (ref 98–110)
Creat: 0.95 mg/dL (ref 0.70–1.35)
Globulin: 2.5 g/dL (calc) (ref 1.9–3.7)
Glucose, Bld: 79 mg/dL (ref 65–99)
Potassium: 4.4 mmol/L (ref 3.5–5.3)
Sodium: 140 mmol/L (ref 135–146)
Total Bilirubin: 0.6 mg/dL (ref 0.2–1.2)
Total Protein: 7 g/dL (ref 6.1–8.1)
eGFR: 90 mL/min/{1.73_m2} (ref >=60)

## 2021-05-16 LAB — CBC
HCT: 41.2 % (ref 38.5–50.0)
Hemoglobin: 13.7 g/dL (ref 13.2–17.1)
MCH: 25 pg — ABNORMAL LOW (ref 27.0–33.0)
MCHC: 33.3 g/dL (ref 32.0–36.0)
MCV: 75.2 fL — ABNORMAL LOW (ref 80.0–100.0)
MPV: 10.8 fL (ref 7.5–12.5)
Platelets: 260 10*3/uL (ref 140–400)
RBC: 5.48 10*6/uL (ref 4.20–5.80)
RDW: 15.7 % — ABNORMAL HIGH (ref 11.0–15.0)
WBC: 7.6 10*3/uL (ref 3.8–10.8)

## 2021-06-13 ENCOUNTER — Other Ambulatory Visit: Payer: Self-pay | Admitting: Osteopathic Medicine

## 2021-06-17 NOTE — Progress Notes (Signed)
? ?Acute Office Visit ? ?Subjective:  ? ?  ?Patient ID: Justin Frank, male    DOB: 04/17/1955, 66 y.o.   MRN: [id number] ? ?Chief Complaint  ?Patient presents with  ? Ear Pain  ? ? ?Otalgia  ?There is pain in both (mostly the right) ears. This is a new problem. The current episode started 1 to 4 weeks ago. The problem occurs constantly. The problem has been gradually worsening. There has been no fever. The pain is at a severity of 5/10. The pain is moderate. Associated symptoms include hearing loss and neck pain (Right submandibular mild lymphadenopathy). Pertinent negatives include no abdominal pain, coughing, diarrhea, ear discharge, headaches, rhinorrhea, sore throat or vomiting. He has tried ear drops and acetaminophen for the symptoms. The treatment provided no relief. There is no history of a chronic ear infection, hearing loss or a tympanostomy tube.  ? ? ?Review of Systems  ?Constitutional:  Negative for chills, fever and malaise/fatigue.  ?HENT:  Positive for ear pain and hearing loss. Negative for congestion, ear discharge, rhinorrhea, sinus pain and sore throat.   ?Respiratory:  Negative for cough.   ?Gastrointestinal:  Negative for abdominal pain, diarrhea and vomiting.  ?Musculoskeletal:  Positive for neck pain (Right submandibular mild lymphadenopathy).  ?Neurological:  Negative for headaches.  ?   ?Objective:  ?  ?BP 127/83 (BP Location: Right Arm, Cuff Size: Large)   Pulse 67   Resp 20   Ht '5\' 4"'$  (1.626 m)   Wt 158 lb 3.2 oz (71.8 kg)   SpO2 100%   BMI 27.15 kg/m?  ? ? ?Physical Exam ?Vitals and nursing note reviewed.  ?Constitutional:   ?   General: He is not in acute distress. ?   Appearance: Normal appearance.  ?HENT:  ?   Head: Normocephalic and atraumatic.  ?   Right Ear: Ear canal and external ear normal. There is no impacted cerumen.  ?   Left Ear: Ear canal and external ear normal. There is no impacted cerumen.  ?   Ears:  ?   Comments: Right TM dull, mildly bulging. ?Left TM  pearly gray, slightly bulging with serous effusion. ?   Nose: Nose normal.  ?Eyes:  ?   General:     ?   Right eye: No discharge.     ?   Left eye: No discharge.  ?   Extraocular Movements: Extraocular movements intact.  ?   Pupils: Pupils are equal, round, and reactive to light.  ?Cardiovascular:  ?   Rate and Rhythm: Normal rate and regular rhythm.  ?   Pulses: Normal pulses.  ?   Heart sounds: Normal heart sounds. No murmur heard. ?  No friction rub. No gallop.  ?Pulmonary:  ?   Effort: Pulmonary effort is normal. No respiratory distress.  ?   Breath sounds: Normal breath sounds.  ?Lymphadenopathy:  ?   Cervical: Cervical adenopathy (Right submandibular) present.  ?Skin: ?   General: Skin is warm and dry.  ?Neurological:  ?   Mental Status: He is alert and oriented to person, place, and time.  ?Psychiatric:     ?   Mood and Affect: Mood normal.     ?   Behavior: Behavior normal.     ?   Thought Content: Thought content normal.     ?   Judgment: Judgment normal.  ? ? ?No results found for any visits on 06/18/21. ? ? ?   ?Assessment & Plan:  ? ?Problem  List Items Addressed This Visit   ? ?  ? Nervous and Auditory  ? Non-recurrent acute serous otitis media of right ear - Primary  ?  Exam consistent with otitis media of the right ear but suspect this is complicated by eustachian tube dysfunction.  Refilling Flonase with instructions to use twice daily for the next 7 days then resume once daily dosing.  Sending in a azithromycin with instructions to complete this as prescribed.  Okay to use Tylenol/ibuprofen as needed for discomfort.  Reviewed management of earwax and avoidance of placing things in the ear. ? ?  ?  ? Relevant Medications  ? azithromycin (ZITHROMAX) 250 MG tablet  ? ? ?Meds ordered this encounter  ?Medications  ? fluticasone (FLONASE) 50 MCG/ACT nasal spray  ?  Sig: SHAKE LIQUID AND USE 1 SPRAY IN EACH NOSTRIL EVERY DAY  ?  Dispense:  16 g  ?  Refill:  1  ? azithromycin (ZITHROMAX) 250 MG tablet  ?   Sig: Take 2 tablets on day 1, then 1 tablet daily on days 2 through 5  ?  Dispense:  6 tablet  ?  Refill:  0  ?  Order Specific Question:   Supervising Provider  ?  Answer:   MATTHEWS, CODY [[id number]]  ? ? ?Return if symptoms worsen or fail to improve. ? ?___________________________________________ ?Clearnce Sorrel, DNP, APRN, FNP-BC ?Primary Care and Sports Medicine ?[place] ? ? ? ?

## 2021-06-18 ENCOUNTER — Encounter: Payer: Self-pay | Admitting: Medical-Surgical

## 2021-06-18 ENCOUNTER — Ambulatory Visit: Payer: BC Managed Care – PPO | Admitting: Medical-Surgical

## 2021-06-18 VITALS — BP 127/83 | HR 67 | Resp 20 | Ht 64.0 in | Wt 158.2 lb

## 2021-06-18 DIAGNOSIS — H6501 Acute serous otitis media, right ear: Secondary | ICD-10-CM | POA: Diagnosis not present

## 2021-06-18 MED ORDER — FLUTICASONE PROPIONATE 50 MCG/ACT NA SUSP: NASAL | 1 refills | Status: DC

## 2021-06-18 MED ORDER — AZITHROMYCIN 250 MG PO TABS: ORAL_TABLET | ORAL | 0 refills | Status: AC

## 2021-06-18 NOTE — Assessment & Plan Note (Signed)
Exam consistent with otitis media of the right ear but suspect this is complicated by eustachian tube dysfunction.  Refilling Flonase with instructions to use twice daily for the next 7 days then resume once daily dosing.  Sending in a azithromycin with instructions to complete this as prescribed.  Okay to use Tylenol/ibuprofen as needed for discomfort.  Reviewed management of earwax and avoidance of placing things in the ear. ?

## 2021-06-25 ENCOUNTER — Ambulatory Visit (INDEPENDENT_AMBULATORY_CARE_PROVIDER_SITE_OTHER): Payer: BC Managed Care – PPO

## 2021-06-25 ENCOUNTER — Ambulatory Visit (INDEPENDENT_AMBULATORY_CARE_PROVIDER_SITE_OTHER): Payer: BC Managed Care – PPO | Admitting: Sports Medicine

## 2021-06-25 DIAGNOSIS — M5416 Radiculopathy, lumbar region: Secondary | ICD-10-CM

## 2021-06-25 DIAGNOSIS — M7541 Impingement syndrome of right shoulder: Secondary | ICD-10-CM | POA: Diagnosis not present

## 2021-06-25 NOTE — Assessment & Plan Note (Signed)
Justin Frank has known lumbar DDD, historically has done well with multiple left L4-L5 interlaminar epidurals, last epidural was recently done, transforaminal this time, I think for the next epidurals we should go back to interlaminar. ?I will order his next interlaminar epidural but in the meantime he will double his gabapentin to 900 mg at night, 600 during the day. ?If this fails we will refer him to neurosurgery. ?

## 2021-06-25 NOTE — Progress Notes (Signed)
? ? ?  Procedures performed today:   ? ?Procedure: Real-time Ultrasound Guided injection of the right subacromial bursa ?Device: Samsung HS60  ?Verbal informed consent obtained.  ?Time-out conducted.  ?Noted no overlying erythema, induration, or other signs of local infection.  ?Skin prepped in a sterile fashion.  ?Local anesthesia: Topical Ethyl chloride.  ?With sterile technique and under real time ultrasound guidance: Noted intact cuff, 1 cc Kenalog 40, 1 cc lidocaine, 1 cc bupivacaine injected easily ?Completed without difficulty  ?Advised to call if fevers/chills, erythema, induration, drainage, or persistent bleeding.  ?Images permanently stored and available for review in PACS.  ?Impression: Technically successful ultrasound guided injection. ? ?Independent interpretation of notes and tests performed by another provider:  ? ?None. ? ?Brief History, Exam, Impression, and Recommendations:   ? ?Impingement syndrome, shoulder, right ?Pleasant 66 year old male, history of left sided rotator cuff surgery, increasing pain right shoulder, impingement signs on exam, persistent now after physical therapy, adding subacromial injection today, return to see me in 6 weeks. ? ?Left lumbar radiculitis ?Justin Frank has known lumbar DDD, historically has done well with multiple left L4-L5 interlaminar epidurals, last epidural was recently done, transforaminal this time, I think for the next epidurals we should go back to interlaminar. ?I will order his next interlaminar epidural but in the meantime he will double his gabapentin to 900 mg at night, 600 during the day. ?If this fails we will refer him to neurosurgery. ? ? ? ?___________________________________________ ?Gwen Her. Dianah Field, M.D., ABFM., CAQSM. ?Primary Care and Sports Medicine ?[place] ? ?Adjunct Instructor of Family Medicine  ?[employer] ?

## 2021-06-25 NOTE — Assessment & Plan Note (Signed)
Pleasant 66 year old male, history of left sided rotator cuff surgery, increasing pain right shoulder, impingement signs on exam, persistent now after physical therapy, adding subacromial injection today, return to see me in 6 weeks. ?

## 2021-07-04 ENCOUNTER — Other Ambulatory Visit: Payer: Self-pay

## 2021-07-04 MED ORDER — OZEMPIC (1 MG/DOSE) 4 MG/3ML ~~LOC~~ SOPN: 1.0000 mg | PEN_INJECTOR | SUBCUTANEOUS | 3 refills | Status: DC

## 2021-07-11 ENCOUNTER — Other Ambulatory Visit: Payer: BC Managed Care – PPO

## 2021-07-15 ENCOUNTER — Ambulatory Visit
Admission: RE | Admit: 2021-07-15 | Discharge: 2021-07-15 | Disposition: A | Discharge status: Home / Self Care | Payer: BC Managed Care – PPO | Source: Ambulatory Visit | Attending: Sports Medicine | Admitting: Sports Medicine

## 2021-07-15 DIAGNOSIS — M5416 Radiculopathy, lumbar region: Secondary | ICD-10-CM

## 2021-07-15 DIAGNOSIS — M47817 Spondylosis without myelopathy or radiculopathy, lumbosacral region: Secondary | ICD-10-CM | POA: Diagnosis not present

## 2021-07-15 MED ORDER — METHYLPREDNISOLONE ACETATE 40 MG/ML INJ SUSP (RADIOLOG
80.0000 mg | Freq: Once | INTRAMUSCULAR | Status: AC
  Administered 2021-07-15: 80 mg via EPIDURAL

## 2021-07-15 MED ORDER — IOPAMIDOL (ISOVUE-M 200) INJECTION 41%
1.0000 mL | Freq: Once | INTRAMUSCULAR | Status: AC
  Administered 2021-07-15: 1 mL via EPIDURAL

## 2021-07-15 NOTE — Discharge Instructions (Signed)

## 2021-07-18 ENCOUNTER — Encounter: Payer: Self-pay | Admitting: Sports Medicine

## 2021-07-18 ENCOUNTER — Telehealth: Payer: Self-pay

## 2021-07-18 DIAGNOSIS — M5416 Radiculopathy, lumbar region: Secondary | ICD-10-CM

## 2021-07-18 NOTE — Telephone Encounter (Signed)
Initiated Prior authorization [id number] (1 MG/DOSE) '4MG'$ /3ML pen-injectors  Via: Covermymeds Case/Key:[id number]  Status: approved  as of 07/17/21 Reason:awaiting decision letter for dates  Notified Pt via: Mychart

## 2021-07-22 MED ORDER — GABAPENTIN 600 MG PO TABS: 1200.0000 mg | ORAL_TABLET | Freq: Three times a day (TID) | ORAL | 3 refills | Status: DC

## 2021-07-30 ENCOUNTER — Ambulatory Visit: Payer: BC Managed Care – PPO | Admitting: Sports Medicine

## 2021-07-30 ENCOUNTER — Ambulatory Visit (INDEPENDENT_AMBULATORY_CARE_PROVIDER_SITE_OTHER): Payer: BC Managed Care – PPO

## 2021-07-30 DIAGNOSIS — G5712 Meralgia paresthetica, left lower limb: Secondary | ICD-10-CM

## 2021-07-30 DIAGNOSIS — M21611 Bunion of right foot: Secondary | ICD-10-CM | POA: Diagnosis not present

## 2021-07-30 DIAGNOSIS — M5416 Radiculopathy, lumbar region: Secondary | ICD-10-CM

## 2021-07-30 DIAGNOSIS — M19071 Primary osteoarthritis, right ankle and foot: Secondary | ICD-10-CM | POA: Diagnosis not present

## 2021-07-30 MED ORDER — TRAMADOL HCL 50 MG PO TABS: 50.0000 mg | ORAL_TABLET | Freq: Three times a day (TID) | ORAL | 0 refills | Status: DC | PRN

## 2021-07-30 NOTE — Assessment & Plan Note (Signed)
Increasing episodic pain right first MTP, adding x-rays, first metatarsal ray posting in his orthotics, tramadol. Return to see me in 4 to 6 weeks, injection if not better.

## 2021-07-30 NOTE — Assessment & Plan Note (Signed)
Burning sensation anterolateral thigh, negative Tinel's sign at the ASIS. Numbness and burning does not extend past the knee, back is not too bad after his epidural. He does wear jeans and a belt typically, we discussed the importance of loosefitting clothing, avoiding belt pressure over the ASIS. We will print him out some information, tramadol as above, return to see me in 6 weeks, we will consider a lateral femoral cutaneous nerve injection if not better.

## 2021-07-30 NOTE — Progress Notes (Signed)
    Procedures performed today:    None.  Independent interpretation of notes and tests performed by another provider:   None.  Brief History, Exam, Impression, and Recommendations:    Arthritis of first metatarsophalangeal (MTP) joint of right foot Increasing episodic pain right first MTP, adding x-rays, first metatarsal ray posting in his orthotics, tramadol. Return to see me in 4 to 6 weeks, injection if not better.  Left lumbar radiculitis Known lumbar DDD, historically has done well with left L4-L5 interlaminar epidurals, the most recent epidural was recently done, back is not hurting much but he does have a burning sensation left anterior lateral thigh from the groin crease to the knee but not past. He will continue his gabapentin at the current dose, see below for discussion of meralgia paresthetica  Meralgia paresthetica, left Burning sensation anterolateral thigh, negative Tinel's sign at the ASIS. Numbness and burning does not extend past the knee, back is not too bad after his epidural. He does wear jeans and a belt typically, we discussed the importance of loosefitting clothing, avoiding belt pressure over the ASIS. We will print him out some information, tramadol as above, return to see me in 6 weeks, we will consider a lateral femoral cutaneous nerve injection if not better.    ___________________________________________ Gwen Her. Dianah Field, M.D., ABFM., CAQSM. Primary Care and [place] Instructor of [place] of [facility] of Medicine

## 2021-07-30 NOTE — Assessment & Plan Note (Signed)
Known lumbar DDD, historically has done well with left L4-L5 interlaminar epidurals, the most recent epidural was recently done, back is not hurting much but he does have a burning sensation left anterior lateral thigh from the groin crease to the knee but not past. He will continue his gabapentin at the current dose, see below for discussion of meralgia paresthetica

## 2021-08-06 ENCOUNTER — Ambulatory Visit: Payer: BC Managed Care – PPO | Admitting: Sports Medicine

## 2021-08-27 ENCOUNTER — Other Ambulatory Visit: Payer: Self-pay

## 2021-08-27 DIAGNOSIS — M7541 Impingement syndrome of right shoulder: Secondary | ICD-10-CM

## 2021-08-27 MED ORDER — MELOXICAM 15 MG PO TABS: ORAL_TABLET | ORAL | 3 refills | Status: DC

## 2021-09-10 ENCOUNTER — Other Ambulatory Visit: Payer: Self-pay

## 2021-09-10 DIAGNOSIS — M5416 Radiculopathy, lumbar region: Secondary | ICD-10-CM

## 2021-09-10 MED ORDER — GABAPENTIN 600 MG PO TABS: 1200.0000 mg | ORAL_TABLET | Freq: Three times a day (TID) | ORAL | 3 refills | Status: DC

## 2021-09-17 ENCOUNTER — Ambulatory Visit: Payer: BC Managed Care – PPO | Admitting: Sports Medicine

## 2021-09-17 DIAGNOSIS — G5712 Meralgia paresthetica, left lower limb: Secondary | ICD-10-CM

## 2021-09-17 MED ORDER — HYDROCODONE-ACETAMINOPHEN 5-325 MG PO TABS: 1.0000 | ORAL_TABLET | Freq: Three times a day (TID) | ORAL | 0 refills | Status: DC | PRN

## 2021-09-17 NOTE — Assessment & Plan Note (Signed)
Pleasant 66 year old male, he has multifactorial low back pain with radiation down the legs, at the last visit he was having numbness and tingling left anterior/lateral thigh, but not past the knee. Typically his epidurals have been at the L4-L5 level. Unfortunately they no longer helped his anterior lateral thigh pain. He had a negative Tinel's sign at the anterior superior iliac spine. We discussed wearing loosefitting clothing, he is also doing gabapentin at the max dose. Unfortunately continues to have achiness and numbness and tingling anterior/lateral thigh. At this point I think we need nerve conduction/EMG to clarify whether this is of meralgia paresthetica or left L4 radiculopathy. As he does have bilateral symptoms we are proceeding with bilateral lower extremity nerve conduction and EMG. He tells me gabapentin just makes him sleepy so we will do hydrocodone for short course, though I am happy to refill this until he gets his nerve conduction/EMG.  Discontinue gabapentin for now.

## 2021-09-17 NOTE — Progress Notes (Signed)
    Procedures performed today:    None.  Independent interpretation of notes and tests performed by another provider:   None.  Brief History, Exam, Impression, and Recommendations:    Meralgia paresthetica, left Pleasant 66 year old male, he has multifactorial low back pain with radiation down the legs, at the last visit he was having numbness and tingling left anterior/lateral thigh, but not past the knee. Typically his epidurals have been at the L4-L5 level. Unfortunately they no longer helped his anterior lateral thigh pain. He had a negative Tinel's sign at the anterior superior iliac spine. We discussed wearing loosefitting clothing, he is also doing gabapentin at the max dose. Unfortunately continues to have achiness and numbness and tingling anterior/lateral thigh. At this point I think we need nerve conduction/EMG to clarify whether this is of meralgia paresthetica or left L4 radiculopathy. As he does have bilateral symptoms we are proceeding with bilateral lower extremity nerve conduction and EMG. He tells me gabapentin just makes him sleepy so we will do hydrocodone for short course, though I am happy to refill this until he gets his nerve conduction/EMG.  Discontinue gabapentin for now.    ____________________________________________ Gwen Her. Dianah Field, M.D., ABFM., CAQSM., AME. Primary Care and Sports Medicine [facility]  Adjunct Professor of [employer] of [employer]  Risk manager

## 2021-09-28 ENCOUNTER — Encounter: Payer: Self-pay | Admitting: Sports Medicine

## 2021-10-09 DIAGNOSIS — G603 Idiopathic progressive neuropathy: Secondary | ICD-10-CM | POA: Diagnosis not present

## 2021-10-09 DIAGNOSIS — E538 Deficiency of other specified B group vitamins: Secondary | ICD-10-CM | POA: Diagnosis not present

## 2021-10-09 DIAGNOSIS — G5622 Lesion of ulnar nerve, left upper limb: Secondary | ICD-10-CM | POA: Diagnosis not present

## 2021-10-09 DIAGNOSIS — R634 Abnormal weight loss: Secondary | ICD-10-CM | POA: Diagnosis not present

## 2021-10-09 DIAGNOSIS — G609 Hereditary and idiopathic neuropathy, unspecified: Secondary | ICD-10-CM | POA: Diagnosis not present

## 2021-10-09 DIAGNOSIS — R202 Paresthesia of skin: Secondary | ICD-10-CM | POA: Diagnosis not present

## 2021-11-06 DIAGNOSIS — R201 Hypoesthesia of skin: Secondary | ICD-10-CM | POA: Diagnosis not present

## 2021-11-06 DIAGNOSIS — M79652 Pain in left thigh: Secondary | ICD-10-CM | POA: Diagnosis not present

## 2021-11-06 DIAGNOSIS — G603 Idiopathic progressive neuropathy: Secondary | ICD-10-CM | POA: Diagnosis not present

## 2021-11-06 DIAGNOSIS — G5712 Meralgia paresthetica, left lower limb: Secondary | ICD-10-CM | POA: Diagnosis not present

## 2021-11-11 ENCOUNTER — Other Ambulatory Visit: Payer: Self-pay | Admitting: Osteopathic Medicine

## 2021-11-11 DIAGNOSIS — E119 Type 2 diabetes mellitus without complications: Secondary | ICD-10-CM

## 2021-11-12 ENCOUNTER — Other Ambulatory Visit: Payer: Self-pay | Admitting: Osteopathic Medicine

## 2021-11-12 DIAGNOSIS — I10 Essential (primary) hypertension: Secondary | ICD-10-CM

## 2021-11-12 MED ORDER — AMLODIPINE BESYLATE 10 MG PO TABS: 10.0000 mg | ORAL_TABLET | Freq: Every day | ORAL | 0 refills | Status: DC

## 2021-11-17 ENCOUNTER — Ambulatory Visit: Payer: BC Managed Care – PPO | Admitting: Medical-Surgical

## 2021-11-17 ENCOUNTER — Other Ambulatory Visit: Payer: Self-pay | Admitting: Osteopathic Medicine

## 2021-12-04 DIAGNOSIS — M79652 Pain in left thigh: Secondary | ICD-10-CM | POA: Diagnosis not present

## 2021-12-04 DIAGNOSIS — G603 Idiopathic progressive neuropathy: Secondary | ICD-10-CM | POA: Diagnosis not present

## 2021-12-04 DIAGNOSIS — G5712 Meralgia paresthetica, left lower limb: Secondary | ICD-10-CM | POA: Diagnosis not present

## 2021-12-04 DIAGNOSIS — G5603 Carpal tunnel syndrome, bilateral upper limbs: Secondary | ICD-10-CM | POA: Diagnosis not present

## 2021-12-09 ENCOUNTER — Encounter: Payer: Self-pay | Admitting: Medical-Surgical

## 2021-12-09 ENCOUNTER — Ambulatory Visit: Payer: BC Managed Care – PPO | Admitting: Medical-Surgical

## 2021-12-09 VITALS — BP 113/67 | HR 84 | Resp 20 | Ht 64.0 in | Wt 146.9 lb

## 2021-12-09 DIAGNOSIS — Z23 Encounter for immunization: Secondary | ICD-10-CM | POA: Diagnosis not present

## 2021-12-09 DIAGNOSIS — E785 Hyperlipidemia, unspecified: Secondary | ICD-10-CM

## 2021-12-09 DIAGNOSIS — J4520 Mild intermittent asthma, uncomplicated: Secondary | ICD-10-CM | POA: Diagnosis not present

## 2021-12-09 DIAGNOSIS — E119 Type 2 diabetes mellitus without complications: Secondary | ICD-10-CM | POA: Diagnosis not present

## 2021-12-09 DIAGNOSIS — I10 Essential (primary) hypertension: Secondary | ICD-10-CM | POA: Diagnosis not present

## 2021-12-09 DIAGNOSIS — K219 Gastro-esophageal reflux disease without esophagitis: Secondary | ICD-10-CM

## 2021-12-09 DIAGNOSIS — K5909 Other constipation: Secondary | ICD-10-CM

## 2021-12-09 MED ORDER — FLUTICASONE PROPIONATE 50 MCG/ACT NA SUSP: NASAL | 1 refills | Status: DC

## 2021-12-09 MED ORDER — OZEMPIC (1 MG/DOSE) 4 MG/3ML ~~LOC~~ SOPN: 1.0000 mg | PEN_INJECTOR | SUBCUTANEOUS | 3 refills | Status: DC

## 2021-12-09 MED ORDER — IPRATROPIUM BROMIDE 0.03 % NA SOLN: 2.0000 | Freq: Two times a day (BID) | NASAL | 0 refills | Status: DC

## 2021-12-09 NOTE — Progress Notes (Signed)
Established Patient Office Visit  Subjective   Patient ID: Justin Frank, male   DOB: 07-Mar-1955 Age: 66 y.o. MRN: [id number]   Chief Complaint  Patient presents with   Medication Refill    HPI Pleasant 66 year old male presenting today for the following:  Hypertension: Not regularly checking blood pressure at home.  Currently taking amlodipine 10 mg and valsartan 160 mg daily, tolerating well without side effects.  Occasionally add salt to food.  Exercise as tolerated.Denies CP, SOB, palpitations, lower extremity edema, dizziness, headaches, or vision changes.  Hyperlipidemia: Taking atorvastatin 20 mg daily, tolerating well without side effects.  Diabetes: Taking semaglutide 1 mg weekly along with metformin 1000 mg daily.  Tolerating both medications well without side effects.  Following a diabetic diet and gets regular activity as tolerated.  Asthma: Taking Zyrtec 10 mg daily, Flonase daily, and Atrovent nasal spray every 12 hours as needed.  Symptoms well controlled and only seasonally exacerbated.  Constipation: Taking Linzess 145 mcg daily, tolerating well without side effects.  Feels that the medication is working well to control symptoms.  GERD: Taking omeprazole 40 mg daily, tolerating well without side effects.  The medication is working well.   Objective:    Vitals:   12/09/21 1533  BP: 113/67  Pulse: 84  Resp: 20  Height: '5\' 4"'$  (1.626 m)  Weight: 146 lb 14.4 oz (66.6 kg)  SpO2: 98%  BMI (Calculated): 25.2    Physical Exam Vitals reviewed.  Constitutional:      General: He is not in acute distress.    Appearance: Normal appearance. He is obese. He is not ill-appearing.  HENT:     Head: Normocephalic.  Cardiovascular:     Rate and Rhythm: Normal rate.     Pulses: Normal pulses.     Heart sounds: Normal heart sounds. No murmur heard.    No friction rub. No gallop.  Pulmonary:     Effort: Pulmonary effort is normal. No respiratory distress.      Breath sounds: Normal breath sounds.  Skin:    General: Skin is warm and dry.  Neurological:     Mental Status: He is alert and oriented to person, place, and time.  Psychiatric:        Mood and Affect: Mood normal.        Behavior: Behavior normal.        Thought Content: Thought content normal.        Judgment: Judgment normal.   No results found for this or any previous visit (from the past 24 hour(s)).     The 10-year ASCVD risk score (Arnett DK, et al., 2019) is: 18.2%   Values used to calculate the score:     Age: 27 years     Sex: Male     Is Non-Hispanic African American: Yes     Diabetic: Yes     Tobacco smoker: No     Systolic Blood Pressure: 262 mmHg     Is BP treated: Yes     HDL Cholesterol: 75 mg/dL     Total Cholesterol: 158 mg/dL   Assessment & Plan:   1. Essential hypertension Checking labs as below.  Blood pressure at goal today.  Continue amlodipine and valsartan as prescribed.  Monitor blood pressure at home with a goal of 130/80 or less.  Low-sodium diet and regular intentional exercise recommended. - CBC with Differential/Platelet - COMPLETE METABOLIC PANEL WITH GFR - Lipid panel  2. Hyperlipidemia, unspecified hyperlipidemia type  Checking labs.  Continue atorvastatin 20 mg daily.  3. Controlled type 2 diabetes mellitus without complication, without long-term current use of insulin (HCC) Checking hemoglobin A1c.  Continue semaglutide and metformin as prescribed. - Hemoglobin A1c  4. Mild intermittent asthma without complication Well-controlled.  Continue Zyrtec, Flonase, and Atrovent as prescribed.  5. Chronic constipation Stable.  Continue Linzess 145 mcg daily.  6. Gastroesophageal reflux disease, unspecified whether esophagitis present Continue omeprazole 40 mg daily.  Symptoms well controlled.  7. Need for influenza vaccination Flu vaccine given in office today. - Flu Vaccine QUAD 6+ mos PF IM (Fluarix Quad PF)   Return in about 6  months (around 06/10/2022) for chronic disease follow up.  ___________________________________________ Clearnce Sorrel, DNP, APRN, FNP-BC Primary Care and [place]

## 2021-12-10 LAB — CBC WITH DIFFERENTIAL/PLATELET
Absolute Monocytes: 783 cells/uL (ref 200–950)
Basophils Absolute: 18 cells/uL (ref 0–200)
Basophils Relative: 0.2 %
Eosinophils Absolute: 27 cells/uL (ref 15–500)
Eosinophils Relative: 0.3 %
HCT: 38.6 % (ref 38.5–50.0)
Hemoglobin: 12.6 g/dL — ABNORMAL LOW (ref 13.2–17.1)
Lymphs Abs: 1566 cells/uL (ref 850–3900)
MCH: 24.3 pg — ABNORMAL LOW (ref 27.0–33.0)
MCHC: 32.6 g/dL (ref 32.0–36.0)
MCV: 74.4 fL — ABNORMAL LOW (ref 80.0–100.0)
MPV: 9.6 fL (ref 7.5–12.5)
Monocytes Relative: 8.7 %
Neutro Abs: 6606 cells/uL (ref 1500–7800)
Neutrophils Relative %: 73.4 %
Platelets: 215 10*3/uL (ref 140–400)
RBC: 5.19 10*6/uL (ref 4.20–5.80)
RDW: 14.9 % (ref 11.0–15.0)
Total Lymphocyte: 17.4 %
WBC: 9 10*3/uL (ref 3.8–10.8)

## 2021-12-10 LAB — COMPLETE METABOLIC PANEL WITH GFR
AG Ratio: 1.9 (calc) (ref 1.0–2.5)
ALT: 34 U/L (ref 9–46)
AST: 23 U/L (ref 10–35)
Albumin: 4.2 g/dL (ref 3.6–5.1)
Alkaline phosphatase (APISO): 50 U/L (ref 35–144)
BUN: 20 mg/dL (ref 7–25)
CO2: 26 mmol/L (ref 20–32)
Calcium: 9.4 mg/dL (ref 8.6–10.3)
Chloride: 104 mmol/L (ref 98–110)
Creat: 0.97 mg/dL (ref 0.70–1.35)
Globulin: 2.2 g/dL (calc) (ref 1.9–3.7)
Glucose, Bld: 79 mg/dL (ref 65–99)
Potassium: 4.3 mmol/L (ref 3.5–5.3)
Sodium: 139 mmol/L (ref 135–146)
Total Bilirubin: 0.6 mg/dL (ref 0.2–1.2)
Total Protein: 6.4 g/dL (ref 6.1–8.1)
eGFR: 88 mL/min/{1.73_m2} (ref >=60)

## 2021-12-10 LAB — HEMOGLOBIN A1C
Hgb A1c MFr Bld: 6 % of total Hgb — ABNORMAL HIGH (ref <5.7)
Mean Plasma Glucose: 126 mg/dL
eAG (mmol/L): 7 mmol/L

## 2021-12-10 LAB — LIPID PANEL
Cholesterol: 163 mg/dL (ref <200)
HDL: 81 mg/dL (ref >=40)
LDL Cholesterol (Calc): 67 mg/dL (calc)
Non-HDL Cholesterol (Calc): 82 mg/dL (calc) (ref <130)
Total CHOL/HDL Ratio: 2 (calc) (ref <5.0)
Triglycerides: 69 mg/dL (ref <150)

## 2021-12-17 ENCOUNTER — Other Ambulatory Visit: Payer: Self-pay | Admitting: Medical-Surgical

## 2021-12-17 DIAGNOSIS — I10 Essential (primary) hypertension: Secondary | ICD-10-CM

## 2021-12-25 ENCOUNTER — Encounter: Payer: Self-pay | Admitting: Medical-Surgical

## 2022-01-14 ENCOUNTER — Ambulatory Visit: Payer: BC Managed Care – PPO | Admitting: Sports Medicine

## 2022-01-24 ENCOUNTER — Other Ambulatory Visit: Payer: Self-pay | Admitting: Medical-Surgical

## 2022-01-29 DIAGNOSIS — M79652 Pain in left thigh: Secondary | ICD-10-CM | POA: Diagnosis not present

## 2022-01-29 DIAGNOSIS — M5417 Radiculopathy, lumbosacral region: Secondary | ICD-10-CM | POA: Diagnosis not present

## 2022-01-29 DIAGNOSIS — M5459 Other low back pain: Secondary | ICD-10-CM | POA: Diagnosis not present

## 2022-01-29 DIAGNOSIS — G603 Idiopathic progressive neuropathy: Secondary | ICD-10-CM | POA: Diagnosis not present

## 2022-02-05 ENCOUNTER — Ambulatory Visit: Payer: BC Managed Care – PPO | Admitting: Sports Medicine

## 2022-02-10 DIAGNOSIS — M48061 Spinal stenosis, lumbar region without neurogenic claudication: Secondary | ICD-10-CM | POA: Diagnosis not present

## 2022-02-10 DIAGNOSIS — M5116 Intervertebral disc disorders with radiculopathy, lumbar region: Secondary | ICD-10-CM | POA: Diagnosis not present

## 2022-02-10 DIAGNOSIS — M5417 Radiculopathy, lumbosacral region: Secondary | ICD-10-CM | POA: Diagnosis not present

## 2022-02-10 DIAGNOSIS — M4726 Other spondylosis with radiculopathy, lumbar region: Secondary | ICD-10-CM | POA: Diagnosis not present

## 2022-02-10 DIAGNOSIS — M4727 Other spondylosis with radiculopathy, lumbosacral region: Secondary | ICD-10-CM | POA: Diagnosis not present

## 2022-02-15 ENCOUNTER — Other Ambulatory Visit: Payer: Self-pay | Admitting: Medical-Surgical

## 2022-02-15 DIAGNOSIS — E119 Type 2 diabetes mellitus without complications: Secondary | ICD-10-CM

## 2022-02-17 ENCOUNTER — Ambulatory Visit: Payer: BC Managed Care – PPO | Admitting: Sports Medicine

## 2022-02-17 ENCOUNTER — Ambulatory Visit (INDEPENDENT_AMBULATORY_CARE_PROVIDER_SITE_OTHER): Payer: BC Managed Care – PPO

## 2022-02-17 DIAGNOSIS — M7541 Impingement syndrome of right shoulder: Secondary | ICD-10-CM

## 2022-02-17 DIAGNOSIS — G5712 Meralgia paresthetica, left lower limb: Secondary | ICD-10-CM | POA: Diagnosis not present

## 2022-02-17 DIAGNOSIS — M1611 Unilateral primary osteoarthritis, right hip: Secondary | ICD-10-CM

## 2022-02-17 DIAGNOSIS — B351 Tinea unguium: Secondary | ICD-10-CM | POA: Insufficient documentation

## 2022-02-17 MED ORDER — HYDROCODONE-ACETAMINOPHEN 5-325 MG PO TABS: 1.0000 | ORAL_TABLET | Freq: Three times a day (TID) | ORAL | 0 refills | Status: DC | PRN

## 2022-02-17 MED ORDER — TERBINAFINE HCL 250 MG PO TABS: 250.0000 mg | ORAL_TABLET | Freq: Every day | ORAL | 1 refills | Status: AC

## 2022-02-17 MED ORDER — TRIAMCINOLONE ACETONIDE 40 MG/ML IJ SUSP
40.0000 mg | Freq: Once | INTRAMUSCULAR | Status: AC
  Administered 2022-02-17: 40 mg via INTRAMUSCULAR

## 2022-02-17 NOTE — Assessment & Plan Note (Signed)
Did well with Lamisil treatment in the past, restarting.

## 2022-02-17 NOTE — Progress Notes (Signed)
    Procedures performed today:    Procedure: Real-time Ultrasound Guided injection of the right subacromial bursa Device: Samsung HS60  Verbal informed consent obtained.  Time-out conducted.  Noted no overlying erythema, induration, or other signs of local infection.  Skin prepped in a sterile fashion.  Local anesthesia: Topical Ethyl chloride.  With sterile technique and under real time ultrasound guidance: Noted intact rotator cuff, 1 cc Kenalog 40, 1 cc lidocaine, 1 cc bupivacaine injected easily Completed without difficulty  Advised to call if fevers/chills, erythema, induration, drainage, or persistent bleeding.  Images permanently stored and available for review in PACS.  Impression: Technically successful ultrasound guided injection.  Independent interpretation of notes and tests performed by another provider:   None.  Brief History, Exam, Impression, and Recommendations:    Impingement syndrome, shoulder, right Very pleasant 66 year old male, history of left-sided rotator cuff surgery, impingement signs on the right with intermittent pain, last injected in April of this year, did really well for 5 months, with recurrence of pain and impingement signs we did a repeat subacromial injection and he will get back diligent with his home conditioning. If this recurs we will consider an MRI.  Primary osteoarthritis of right hip Sulaiman does have hip arthritis, last injection was in February 2022, now having a bit of a recurrence of pain, refilling hydrocodone, he will get back on his conditioning and we can reinject his right hip in a month if needed.  Onychomycosis Did well with Lamisil treatment in the past, restarting.    ____________________________________________ Gwen Her. Dianah Field, M.D., ABFM., CAQSM., AME. Primary Care and Sports Medicine [facility]  Adjunct Professor of [employer] of  Medicine  Risk manager

## 2022-02-17 NOTE — Addendum Note (Signed)
Addended by: Tarri Glenn A on: 02/17/2022 10:02 AM   Modules accepted: Orders

## 2022-02-17 NOTE — Assessment & Plan Note (Signed)
Very pleasant 66 year old male, history of left-sided rotator cuff surgery, impingement signs on the right with intermittent pain, last injected in April of this year, did really well for 5 months, with recurrence of pain and impingement signs we did a repeat subacromial injection and he will get back diligent with his home conditioning. If this recurs we will consider an MRI.

## 2022-02-17 NOTE — Assessment & Plan Note (Signed)
Justin Frank does have hip arthritis, last injection was in February 2022, now having a bit of a recurrence of pain, refilling hydrocodone, he will get back on his conditioning and we can reinject his right hip in a month if needed.

## 2022-04-04 ENCOUNTER — Encounter: Payer: Self-pay | Admitting: Medical-Surgical

## 2022-04-06 MED ORDER — OMEPRAZOLE 40 MG PO CPDR: 40.0000 mg | DELAYED_RELEASE_CAPSULE | Freq: Every day | ORAL | 1 refills | Status: DC

## 2022-05-11 ENCOUNTER — Other Ambulatory Visit: Payer: Self-pay | Admitting: Medical-Surgical

## 2022-05-11 DIAGNOSIS — E119 Type 2 diabetes mellitus without complications: Secondary | ICD-10-CM

## 2022-05-13 ENCOUNTER — Encounter: Payer: Self-pay | Admitting: Gastroenterology

## 2022-05-21 ENCOUNTER — Encounter: Payer: Self-pay | Admitting: Gastroenterology

## 2022-06-02 ENCOUNTER — Ambulatory Visit (INDEPENDENT_AMBULATORY_CARE_PROVIDER_SITE_OTHER): Payer: No Typology Code available for payment source | Admitting: Sports Medicine

## 2022-06-02 ENCOUNTER — Other Ambulatory Visit (INDEPENDENT_AMBULATORY_CARE_PROVIDER_SITE_OTHER): Payer: No Typology Code available for payment source

## 2022-06-02 ENCOUNTER — Other Ambulatory Visit: Payer: Self-pay

## 2022-06-02 DIAGNOSIS — M7541 Impingement syndrome of right shoulder: Secondary | ICD-10-CM

## 2022-06-02 DIAGNOSIS — M1611 Unilateral primary osteoarthritis, right hip: Secondary | ICD-10-CM | POA: Diagnosis not present

## 2022-06-02 NOTE — Assessment & Plan Note (Signed)
Recurrence of impingement syndrome after good relief from the injection back in December of last year. Repeat injection today.

## 2022-06-02 NOTE — Assessment & Plan Note (Signed)
Right hip has done well since injection in February 2022, having pain at left groin radiating into the left upper thigh anteriorly worse at night, he has known bilateral hip osteoarthritis, we injected his left hip joint today, return to see me in 6 weeks as needed.

## 2022-06-02 NOTE — Progress Notes (Signed)
    Procedures performed today:    Procedure: Real-time Ultrasound Guided injection of the right subacromial bursa Device: Samsung HS60  Verbal informed consent obtained.  Time-out conducted.  Noted no overlying erythema, induration, or other signs of local infection.  Skin prepped in a sterile fashion.  Local anesthesia: Topical Ethyl chloride.  With sterile technique and under real time ultrasound guidance: Intact rotator cuff noted, 1 cc Kenalog 40, 1 cc lidocaine, 1 cc bupivacaine injected easily Completed without difficulty  Advised to call if fevers/chills, erythema, induration, drainage, or persistent bleeding.  Images permanently stored and available for review in PACS.  Impression: Technically successful ultrasound guided injection.  Procedure: Real-time Ultrasound Guided injection of the left hip joint Device: Samsung HS60  Verbal informed consent obtained.  Time-out conducted.  Noted no overlying erythema, induration, or other signs of local infection.  Skin prepped in a sterile fashion.  Local anesthesia: Topical Ethyl chloride.  With sterile technique and under real time ultrasound guidance: Noted mild synovitis, 1 cc Kenalog 40, 2 cc lidocaine, 2 cc bupivacaine injected easily Completed without difficulty  Advised to call if fevers/chills, erythema, induration, drainage, or persistent bleeding.  Images permanently stored and available for review in PACS.  Impression: Technically successful ultrasound guided injection.  Independent interpretation of notes and tests performed by another provider:   None.  Brief History, Exam, Impression, and Recommendations:    Impingement syndrome, shoulder, right Recurrence of impingement syndrome after good relief from the injection back in December of last year. Repeat injection today.  Primary osteoarthritis of both hips Right hip has done well since injection in February 2022, having pain at left groin radiating into the  left upper thigh anteriorly worse at night, he has known bilateral hip osteoarthritis, we injected his left hip joint today, return to see me in 6 weeks as needed.    ____________________________________________ Gwen Her. Dianah Field, M.D., ABFM., CAQSM., AME. Primary Care and Sports Medicine [place] [facility]  Adjunct Professor of [employer] of Medicine  Risk manager

## 2022-06-11 ENCOUNTER — Ambulatory Visit (INDEPENDENT_AMBULATORY_CARE_PROVIDER_SITE_OTHER): Payer: No Typology Code available for payment source | Admitting: Medical-Surgical

## 2022-06-11 ENCOUNTER — Encounter: Payer: Self-pay | Admitting: Medical-Surgical

## 2022-06-11 VITALS — BP 112/68 | HR 79 | Resp 20 | Ht 64.0 in | Wt 143.1 lb

## 2022-06-11 DIAGNOSIS — I10 Essential (primary) hypertension: Secondary | ICD-10-CM | POA: Diagnosis not present

## 2022-06-11 DIAGNOSIS — E119 Type 2 diabetes mellitus without complications: Secondary | ICD-10-CM

## 2022-06-11 DIAGNOSIS — E785 Hyperlipidemia, unspecified: Secondary | ICD-10-CM | POA: Diagnosis not present

## 2022-06-11 DIAGNOSIS — E1169 Type 2 diabetes mellitus with other specified complication: Secondary | ICD-10-CM | POA: Diagnosis not present

## 2022-06-11 LAB — POCT UA - MICROALBUMIN
Creatinine, POC: 200 mg/dL
Microalbumin Ur, POC: 80 mg/L

## 2022-06-11 LAB — POCT GLYCOSYLATED HEMOGLOBIN (HGB A1C)
HbA1c, POC (controlled diabetic range): 5.9 % (ref 0.0–7.0)
Hemoglobin A1C: 5.9 % — AB (ref 4.0–5.6)

## 2022-06-11 MED ORDER — VALSARTAN 160 MG PO TABS: ORAL_TABLET | ORAL | 3 refills | Status: DC

## 2022-06-11 MED ORDER — FLUTICASONE PROPIONATE 50 MCG/ACT NA SUSP: NASAL | 1 refills | Status: DC

## 2022-06-11 MED ORDER — ATORVASTATIN CALCIUM 20 MG PO TABS: 20.0000 mg | ORAL_TABLET | Freq: Every day | ORAL | 3 refills | Status: DC

## 2022-06-11 MED ORDER — FAMOTIDINE 20 MG PO TABS: 20.0000 mg | ORAL_TABLET | Freq: Two times a day (BID) | ORAL | 1 refills | Status: DC | PRN

## 2022-06-11 MED ORDER — CETIRIZINE HCL 10 MG PO TABS: ORAL_TABLET | ORAL | 3 refills | Status: DC

## 2022-06-11 MED ORDER — OZEMPIC (1 MG/DOSE) 4 MG/3ML ~~LOC~~ SOPN: 1.0000 mg | PEN_INJECTOR | SUBCUTANEOUS | 3 refills | Status: DC

## 2022-06-11 MED ORDER — AMLODIPINE BESYLATE 10 MG PO TABS: ORAL_TABLET | ORAL | 3 refills | Status: DC

## 2022-06-11 NOTE — Progress Notes (Signed)
        Established patient visit  History, exam, impression, and plan:  Essential hypertension Taking amlodipine 10 mg and valsartan 160 mg daily, tolerating well without side effects.  Has a cuff at home but does not regularly monitor his blood pressure.  Occasionally his wife will check it and his he reports that the readings are at goal.  Following a low-sodium diet and staying physically active.  No concerning symptoms.  Exam benign with no concerning findings.  Updating labs today.  Continue valsartan and amlodipine as prescribed.  Blood pressure at goal.  Controlled type 2 diabetes mellitus without complication, without long-term current use of insulin (HCC) Taking metformin 1000 mg daily and Ozempic 1 mg weekly, tolerating both medications well without side effects.  Following a diabetic diet.  Previously, hemoglobin A1c was at 6.0% indicating excellent control.  Today's POCT hemoglobin A1c at 5.9% indicating acceptable control on current medications.  Updating microalbumin which is abnormal however it has improved from the check last year.  Continue statin and ARB therapy.  Continue metformin and Ozempic as prescribed.  Hyperlipidemia associated with type 2 diabetes mellitus Taking atorvastatin 20 mg daily, tolerating well without side effects.  Following a low-fat heart healthy diet.  Checking lipids.  Continue atorvastatin as prescribed.   Procedures performed this visit: None.  Return in about 6 months (around 12/11/2022) for chronic disease follow up.  __________________________________ Thayer Ohm, DNP, APRN, FNP-BC Primary Care and Sports Medicine [facility]

## 2022-06-11 NOTE — Assessment & Plan Note (Signed)
Taking amlodipine 10 mg and valsartan 160 mg daily, tolerating well without side effects.  Has a cuff at home but does not regularly monitor his blood pressure.  Occasionally his wife will check it and his he reports that the readings are at goal.  Following a low-sodium diet and staying physically active.  No concerning symptoms.  Exam benign with no concerning findings.  Updating labs today.  Continue valsartan and amlodipine as prescribed.  Blood pressure at goal.

## 2022-06-11 NOTE — Assessment & Plan Note (Signed)
Taking metformin 1000 mg daily and Ozempic 1 mg weekly, tolerating both medications well without side effects.  Following a diabetic diet.  Previously, hemoglobin A1c was at 6.0% indicating excellent control.  Today's POCT hemoglobin A1c at 5.9% indicating acceptable control on current medications.  Updating microalbumin which is abnormal however it has improved from the check last year.  Continue statin and ARB therapy.  Continue metformin and Ozempic as prescribed.

## 2022-06-11 NOTE — Assessment & Plan Note (Signed)
Taking atorvastatin 20 mg daily, tolerating well without side effects.  Following a low-fat heart healthy diet.  Checking lipids.  Continue atorvastatin as prescribed.

## 2022-06-12 LAB — LIPID PANEL
Cholesterol: 194 mg/dL (ref <200)
HDL: 93 mg/dL (ref >=40)
LDL Cholesterol (Calc): 92 mg/dL (calc)
Non-HDL Cholesterol (Calc): 101 mg/dL (calc) (ref <130)
Total CHOL/HDL Ratio: 2.1 (calc) (ref <5.0)
Triglycerides: 31 mg/dL (ref <150)

## 2022-06-12 LAB — COMPLETE METABOLIC PANEL WITH GFR
AG Ratio: 2 (calc) (ref 1.0–2.5)
ALT: 23 U/L (ref 9–46)
AST: 21 U/L (ref 10–35)
Albumin: 4.6 g/dL (ref 3.6–5.1)
Alkaline phosphatase (APISO): 59 U/L (ref 35–144)
BUN: 20 mg/dL (ref 7–25)
CO2: 29 mmol/L (ref 20–32)
Calcium: 9.7 mg/dL (ref 8.6–10.3)
Chloride: 102 mmol/L (ref 98–110)
Creat: 0.92 mg/dL (ref 0.70–1.35)
Globulin: 2.3 g/dL (calc) (ref 1.9–3.7)
Glucose, Bld: 41 mg/dL — ABNORMAL LOW (ref 65–99)
Potassium: 3.9 mmol/L (ref 3.5–5.3)
Sodium: 138 mmol/L (ref 135–146)
Total Bilirubin: 0.7 mg/dL (ref 0.2–1.2)
Total Protein: 6.9 g/dL (ref 6.1–8.1)
eGFR: 93 mL/min/{1.73_m2} (ref >=60)

## 2022-06-12 LAB — CBC WITH DIFFERENTIAL/PLATELET
Absolute Monocytes: 520 cells/uL (ref 200–950)
Basophils Absolute: 33 cells/uL (ref 0–200)
Basophils Relative: 0.5 %
Eosinophils Absolute: 52 cells/uL (ref 15–500)
Eosinophils Relative: 0.8 %
HCT: 38.4 % — ABNORMAL LOW (ref 38.5–50.0)
Hemoglobin: 12.2 g/dL — ABNORMAL LOW (ref 13.2–17.1)
Lymphs Abs: 2197 cells/uL (ref 850–3900)
MCH: 23.3 pg — ABNORMAL LOW (ref 27.0–33.0)
MCHC: 31.8 g/dL — ABNORMAL LOW (ref 32.0–36.0)
MCV: 73.3 fL — ABNORMAL LOW (ref 80.0–100.0)
MPV: 9.8 fL (ref 7.5–12.5)
Monocytes Relative: 8 %
Neutro Abs: 3699 cells/uL (ref 1500–7800)
Neutrophils Relative %: 56.9 %
Platelets: 277 10*3/uL (ref 140–400)
RBC: 5.24 10*6/uL (ref 4.20–5.80)
RDW: 14.9 % (ref 11.0–15.0)
Total Lymphocyte: 33.8 %
WBC: 6.5 10*3/uL (ref 3.8–10.8)

## 2022-06-12 MED ORDER — ATORVASTATIN CALCIUM 40 MG PO TABS: 40.0000 mg | ORAL_TABLET | Freq: Every day | ORAL | 3 refills | Status: DC

## 2022-06-12 NOTE — Addendum Note (Signed)
Addended byChristen Butter on: 06/12/2022 01:19 PM   Modules accepted: Orders

## 2022-06-15 ENCOUNTER — Ambulatory Visit: Payer: BC Managed Care – PPO | Admitting: Sports Medicine

## 2022-06-15 ENCOUNTER — Encounter: Payer: Self-pay | Admitting: *Deleted

## 2022-06-22 ENCOUNTER — Ambulatory Visit (AMBULATORY_SURGERY_CENTER): Payer: No Typology Code available for payment source | Admitting: *Deleted

## 2022-06-22 VITALS — Ht 64.0 in | Wt 145.0 lb

## 2022-06-22 DIAGNOSIS — Z85038 Personal history of other malignant neoplasm of large intestine: Secondary | ICD-10-CM

## 2022-06-22 MED ORDER — NA SULFATE-K SULFATE-MG SULF 17.5-3.13-1.6 GM/177ML PO SOLN
1.0000 | ORAL | 0 refills | Status: DC
Start: 2022-06-22 — End: 2022-07-21

## 2022-06-22 NOTE — Progress Notes (Signed)
Pt's name and DOB verified at the beginning of the pre-visit.  Pt denies any difficulty with ambulating.  No egg or soy allergy known to patient  No issues known to pt with past sedation with any surgeries or procedures Pt denies having issues being intubated Pt has no issues moving head neck or swallowing No FH of Malignant Hyperthermia Pt is not on diet pills Pt is not on home 02  Pt is not on blood thinners  Pt denies issues with constipation  Pt is not on dialysis Pt denies any upcoming cardiac testing Pt encouraged to use to use Singlecare or Goodrx to reduce cost  Patient's chart reviewed by Cathlyn Parsons CNRA prior to pre-visit and patient appropriate for the [facility].  Pre-visit completed and red dot placed by patient's name on their procedure day (on provider's schedule).  . Visit by phone Pt states weight is  Instructed pt why it is important to and  to call if they have any changes in health or new medications. Directed them to the # given and on instructions.   Pt states they will.  Instructions reviewed with pt and pt states understanding. Instructed to review again prior to procedure. Pt states they will.  Instructions sent by mail with coupon and by my chart

## 2022-07-09 ENCOUNTER — Encounter: Payer: Self-pay | Admitting: Gastroenterology

## 2022-07-21 ENCOUNTER — Ambulatory Visit (AMBULATORY_SURGERY_CENTER): Payer: No Typology Code available for payment source | Admitting: Gastroenterology

## 2022-07-21 ENCOUNTER — Encounter: Payer: Self-pay | Admitting: Gastroenterology

## 2022-07-21 VITALS — BP 108/67 | HR 78 | Temp 97.7°F | Resp 19 | Ht 64.0 in | Wt 145.0 lb

## 2022-07-21 DIAGNOSIS — Z86010 Personal history of colonic polyps: Secondary | ICD-10-CM

## 2022-07-21 DIAGNOSIS — D123 Benign neoplasm of transverse colon: Secondary | ICD-10-CM | POA: Diagnosis not present

## 2022-07-21 DIAGNOSIS — Z09 Encounter for follow-up examination after completed treatment for conditions other than malignant neoplasm: Secondary | ICD-10-CM | POA: Diagnosis present

## 2022-07-21 HISTORY — PX: COLONOSCOPY: SHX174

## 2022-07-21 MED ORDER — SODIUM CHLORIDE 0.9 % IV SOLN
500.0000 mL | Freq: Once | INTRAVENOUS | Status: DC
Start: 2022-07-21 — End: 2022-07-21

## 2022-07-21 NOTE — Patient Instructions (Addendum)
Resume previous diet Continue present medications Await pathology results Repeat colonoscopy in 3 years with 2 day extended prep  Handouts/information given for polyps, diverticulosis   YOU HAD AN ENDOSCOPIC PROCEDURE TODAY AT THE [facility]:   Refer to the procedure report that was given to you for any specific questions about what was found during the examination.  If the procedure report does not answer your questions, please call your gastroenterologist to clarify.  If you requested that your care partner not be given the details of your procedure findings, then the procedure report has been included in a sealed envelope for you to review at your convenience later.  YOU SHOULD EXPECT: Some feelings of bloating in the abdomen. Passage of more gas than usual.  Walking can help get rid of the air that was put into your GI tract during the procedure and reduce the bloating. If you had a lower endoscopy (such as a colonoscopy or flexible sigmoidoscopy) you may notice spotting of blood in your stool or on the toilet paper. If you underwent a bowel prep for your procedure, you may not have a normal bowel movement for a few days.  Please Note:  You might notice some irritation and congestion in your nose or some drainage.  This is from the oxygen used during your procedure.  There is no need for concern and it should clear up in a day or so.  SYMPTOMS TO REPORT IMMEDIATELY:  Following lower endoscopy (colonoscopy):  Excessive amounts of blood in the stool  Significant tenderness or worsening of abdominal pains  Swelling of the abdomen that is new, acute  Fever of 100F or higher For urgent or emergent issues, a gastroenterologist can be reached at any hour by calling [phone]. Do not use MyChart messaging for urgent concerns.   DIET:  We do recommend a small meal at first, but then you may proceed to your regular diet.  Drink plenty of fluids but you should avoid alcoholic  beverages for 24 hours.  ACTIVITY:  You should plan to take it easy for the rest of today and you should NOT DRIVE or use heavy machinery until tomorrow (because of the sedation medicines used during the test).    FOLLOW UP: Our staff will call the number listed on your records the next business day following your procedure.  We will call around 7:15- 8:00 am to check on you and address any questions or concerns that you may have regarding the information given to you following your procedure. If we do not reach you, we will leave a message.     If any biopsies were taken you will be contacted by phone or by letter within the next 1-3 weeks.  Please call us at [phone] if you have not heard about the biopsies in 3 weeks.   SIGNATURES/CONFIDENTIALITY: You and/or your care partner have signed paperwork which will be entered into your electronic medical record.  These signatures attest to the fact that that the information above on your After Visit Summary has been reviewed and is understood.  Full responsibility of the confidentiality of this discharge information lies with you and/or your care-partner.

## 2022-07-21 NOTE — Progress Notes (Signed)
[facility] History and Physical   Primary Care Physician:  Christen Butter, NP   Reason for Procedure:   Colon polyp surveillance  Plan:    Colonoscopy     HPI: Justin Frank is a 67 y.o. male undergoing surveillance colonoscopy.  He has no family history of colon cancer and no chronic GI symptoms.  His last colonoscopy was done in May 2021 by Dr. Orvan Falconer and was notable for 4 polyps, including a 10 mm cecal tubular adenoma.  Two of the polyps were hyperplastic.  He had 4 small polyps removed in 2018 by Dr. Conley Rolls.   Past Medical History:  Diagnosis Date   Allergy    Asthma    Controlled type 2 diabetes mellitus without complication, without long-term current use of insulin (HCC) 03/22/2017   ED (erectile dysfunction)    GERD (gastroesophageal reflux disease)    Hyperlipidemia    Hypertension     Past Surgical History:  Procedure Laterality Date   COLONOSCOPY     HERNIA REPAIR     As per patient - 2012, 2013, 2015    Prior to Admission medications   Medication Sig Start Date End Date Taking? Authorizing Provider  amLODipine (NORVASC) 10 MG tablet TAKE 1 TABLET(10 MG) BY MOUTH DAILY 06/11/22  Yes Christen Butter, NP  atorvastatin (LIPITOR) 40 MG tablet Take 1 tablet (40 mg total) by mouth daily. 06/12/22  Yes Christen Butter, NP  cetirizine (ZYRTEC) 10 MG tablet TAKE 1 TABLET(10 MG) BY MOUTH DAILY 06/11/22  Yes Christen Butter, NP  famotidine (PEPCID) 20 MG tablet Take 1 tablet (20 mg total) by mouth 2 (two) times daily as needed for heartburn or indigestion. 06/11/22  Yes Jessup, Joy, NP  fluticasone (FLONASE) 50 MCG/ACT nasal spray SHAKE LIQUID AND USE 1 SPRAY IN EACH NOSTRIL EVERY DAY 06/11/22  Yes Jessup, Joy, NP  ipratropium (ATROVENT) 0.03 % nasal spray USE 2 SPRAYS IN EACH NOSTRIL EVERY 12 HOURS 01/26/22  Yes Jessup, Joy, NP  metFORMIN (GLUCOPHAGE-XR) 500 MG 24 hr tablet TAKE 2 TABLETS(1000 MG) BY MOUTH DAILY WITH BREAKFAST 05/11/22  Yes Jessup, Joy, NP  gabapentin  (NEURONTIN) 300 MG capsule Take 300 mg by mouth 3 (three) times daily. Patient not taking: Reported on 07/21/2022 08/03/21   [provider]  HYDROcodone-acetaminophen (NORCO/VICODIN) 5-325 MG tablet Take 1 tablet by mouth every 8 (eight) hours as needed for moderate pain. Patient not taking: Reported on 06/22/2022 02/17/22   Monica Becton, MD  hydrOXYzine (ATARAX/VISTARIL) 25 MG tablet TAKE 1 TABLET(25 MG) BY MOUTH EVERY 6 HOURS AS NEEDED FOR ANXIETY OR SLEEP Patient not taking: Reported on 07/21/2022 01/24/20   Sunnie Nielsen, DO  linaclotide [facility]) 145 MCG CAPS capsule Take 1 capsule (145 mcg total) by mouth daily before breakfast. Patient not taking: Reported on 07/21/2022 03/12/21   Christen Butter, NP  meloxicam (MOBIC) 15 MG tablet One tab PO qAM with a meal for 2 weeks, then daily prn pain. Patient not taking: Reported on 06/22/2022 08/27/21   Monica Becton, MD  omeprazole (PRILOSEC) 40 MG capsule Take 1 capsule (40 mg total) by mouth daily. Patient not taking: Reported on 06/22/2022 04/06/22   Christen Butter, NP  Semaglutide, 1 MG/DOSE, (OZEMPIC, 1 MG/DOSE,) 4 MG/3ML SOPN Inject 1 mg into the skin once a week. 06/11/22   Christen Butter, NP  valsartan (DIOVAN) 160 MG tablet TAKE 1 TABLET(160 MG) BY MOUTH DAILY Patient not taking: Reported on 07/21/2022 06/11/22   Christen Butter, NP  Current Outpatient Medications  Medication Sig Dispense Refill   amLODipine (NORVASC) 10 MG tablet TAKE 1 TABLET(10 MG) BY MOUTH DAILY 90 tablet 3   atorvastatin (LIPITOR) 40 MG tablet Take 1 tablet (40 mg total) by mouth daily. 90 tablet 3   cetirizine (ZYRTEC) 10 MG tablet TAKE 1 TABLET(10 MG) BY MOUTH DAILY 90 tablet 3   famotidine (PEPCID) 20 MG tablet Take 1 tablet (20 mg total) by mouth 2 (two) times daily as needed for heartburn or indigestion. 180 tablet 1   fluticasone (FLONASE) 50 MCG/ACT nasal spray SHAKE LIQUID AND USE 1 SPRAY IN EACH NOSTRIL EVERY DAY 16 g 1   ipratropium (ATROVENT)  0.03 % nasal spray USE 2 SPRAYS IN EACH NOSTRIL EVERY 12 HOURS 30 mL 1   metFORMIN (GLUCOPHAGE-XR) 500 MG 24 hr tablet TAKE 2 TABLETS(1000 MG) BY MOUTH DAILY WITH BREAKFAST 180 tablet 0   gabapentin (NEURONTIN) 300 MG capsule Take 300 mg by mouth 3 (three) times daily. (Patient not taking: Reported on 07/21/2022)     HYDROcodone-acetaminophen (NORCO/VICODIN) 5-325 MG tablet Take 1 tablet by mouth every 8 (eight) hours as needed for moderate pain. (Patient not taking: Reported on 06/22/2022) 15 tablet 0   hydrOXYzine (ATARAX/VISTARIL) 25 MG tablet TAKE 1 TABLET(25 MG) BY MOUTH EVERY 6 HOURS AS NEEDED FOR ANXIETY OR SLEEP (Patient not taking: Reported on 07/21/2022) 90 tablet 1   linaclotide (LINZESS) 145 MCG CAPS capsule Take 1 capsule (145 mcg total) by mouth daily before breakfast. (Patient not taking: Reported on 07/21/2022) 90 capsule 1   meloxicam (MOBIC) 15 MG tablet One tab PO qAM with a meal for 2 weeks, then daily prn pain. (Patient not taking: Reported on 06/22/2022) 30 tablet 3   omeprazole (PRILOSEC) 40 MG capsule Take 1 capsule (40 mg total) by mouth daily. (Patient not taking: Reported on 06/22/2022) 90 capsule 1   Semaglutide, 1 MG/DOSE, (OZEMPIC, 1 MG/DOSE,) 4 MG/3ML SOPN Inject 1 mg into the skin once a week. 9 mL 3   valsartan (DIOVAN) 160 MG tablet TAKE 1 TABLET(160 MG) BY MOUTH DAILY (Patient not taking: Reported on 07/21/2022) 90 tablet 3   Current Facility-Administered Medications  Medication Dose Route Frequency Provider Last Rate Last Admin   0.9 %  sodium chloride infusion  500 mL Intravenous Once Jenel Lucks, MD        Allergies as of 07/21/2022 - Review Complete 07/21/2022  Allergen Reaction Noted   Atorvastatin Swelling 05/26/2013   Lisinopril Cough 05/30/2013    Family History  Problem Relation Age of Onset   Hypertension Mother    Hypertension Father    Colon cancer Neg Hx    Esophageal cancer Neg Hx    Rectal cancer Neg Hx    Stomach cancer Neg Hx    Colon  polyps Neg Hx     Social History   Socioeconomic History   Marital status: Married    Spouse name: Willa   Number of children: 8   Years of education: 12   Highest education level: High school graduate  Occupational History   Occupation: Chef  Tobacco Use   Smoking status: Former    Types: Cigarettes    Quit date: 03/02/1977    Years since quitting: 45.4   Smokeless tobacco: Never  Vaping Use   Vaping Use: Never used  Substance and Sexual Activity   Alcohol use: No   Drug use: No   Sexual activity: Yes    Partners: Female  Birth control/protection: None  Other Topics Concern   Not on file  Social History Narrative   Not on file   Social Determinants of Health   Financial Resource Strain: Not on file  Food Insecurity: Not on file  Transportation Needs: Not on file  Physical Activity: Insufficiently Active (03/15/2017)   Exercise Vital Sign    Days of Exercise per Week: 2 days    Minutes of Exercise per Session: 20 min  Stress: Not on file  Social Connections: Not on file  Intimate Partner Violence: Not on file    Review of Systems:  All other review of systems negative except as mentioned in the HPI.  Physical Exam: Vital signs BP 108/71   Pulse 80   Temp 97.7 F (36.5 C) (Skin)   Ht 5\' 4"  (1.626 m)   Wt 145 lb (65.8 kg)   SpO2 100%   BMI 24.89 kg/m   General:   Alert,  Well-developed, well-nourished, pleasant and cooperative in NAD Airway:  Mallampati 2 Lungs:  Clear throughout to auscultation.   Heart:  Regular rate and rhythm; no murmurs, clicks, rubs,  or gallops. Abdomen:  Soft, nontender and nondistended. Normal bowel sounds.   Neuro/Psych:  Normal mood and affect. A and O x 3   Othon Guardia E. Tomasa Rand, MD [facility]

## 2022-07-21 NOTE — Op Note (Signed)
[facility] Patient Name: Justin Frank Procedure Date: 07/21/2022 8:20 AM MRN: [id number] Endoscopist: Lorin Picket E. Tomasa Rand , MD, [id number] Age: 67 Referring MD:  Date of Birth: 1955/07/03 Gender: Male Account #: [id number] Procedure:                Colonoscopy Indications:              High risk colon cancer surveillance: Personal                            history of adenoma (10 mm or greater in size), Last                            colonoscopy: May 2021 Medicines:                Monitored Anesthesia Care Procedure:                Pre-Anesthesia Assessment:                           - Prior to the procedure, a History and Physical                            was performed, and patient medications and                            allergies were reviewed. The patient's tolerance of                            previous anesthesia was also reviewed. The risks                            and benefits of the procedure and the sedation                            options and risks were discussed with the patient.                            All questions were answered, and informed consent                            was obtained. Prior Anticoagulants: The patient has                            taken no anticoagulant or antiplatelet agents. ASA                            Grade Assessment: II - A patient with mild systemic                            disease. After reviewing the risks and benefits,                            the patient was deemed in satisfactory condition to  undergo the procedure.                           After obtaining informed consent, the colonoscope                            was passed under direct vision. Throughout the                            procedure, the patient's blood pressure, pulse, and                            oxygen saturations were monitored continuously. The                            Olympus SN [id number] was  introduced through the anus                            and advanced to the the cecum, identified by                            appendiceal orifice and ileocecal valve. The                            colonoscopy was performed without difficulty. The                            patient tolerated the procedure well. The quality                            of the bowel preparation was inadequate. The                            ileocecal valve, appendiceal orifice, and rectum                            were photographed. The bowel preparation used was                            SUPREP via split dose instruction. Scope In: 8:33:52 AM Scope Out: 8:56:18 AM Scope Withdrawal Time: 0 hours 11 minutes 16 seconds  Total Procedure Duration: 0 hours 22 minutes 26 seconds  Findings:                 The perianal and digital rectal examinations were                            normal. Pertinent negatives include normal                            sphincter tone and no palpable rectal lesions.                           Two sessile polyps were found in the splenic  flexure. The polyps were 3 to 5 mm in size. These                            polyps were removed with a cold snare. Resection                            and retrieval were complete. Estimated blood loss                            was minimal.                           A few medium-mouthed and small-mouthed diverticula                            were found in the sigmoid colon and descending                            colon.                           The exam was otherwise normal throughout the                            examined colon.                           The retroflexed view of the distal rectum and anal                            verge was normal and showed no anal or rectal                            abnormalities. Complications:            No immediate complications. Estimated Blood Loss:     Estimated blood loss  was minimal. Impression:               - Preparation of the colon was inadequate.                           - Two 3 to 5 mm polyps at the splenic flexure,                            removed with a cold snare. Resected and retrieved.                           - Diverticulosis in the sigmoid colon and in the                            descending colon.                           - The distal rectum and anal verge are normal on  retroflexion view. Recommendation:           - Patient has a contact number available for                            emergencies. The signs and symptoms of potential                            delayed complications were discussed with the                            patient. Return to normal activities tomorrow.                            Written discharge instructions were provided to the                            patient.                           - Resume previous diet.                           - Continue present medications.                           - Await pathology results.                           - Repeat colonoscopy in 3 years because the bowel                            preparation was suboptimal.                           - Consider 2-day bowel prep to increase chances of                            good prep. Arren Laminack E. Tomasa Rand, MD 07/21/2022 9:02:55 AM This report has been signed electronically.

## 2022-07-21 NOTE — Progress Notes (Signed)
Sedate, gd SR, tolerated procedure well, VSS, report to RN 

## 2022-07-21 NOTE — Progress Notes (Signed)
Called to room to assist during endoscopic procedure.  Patient ID and intended procedure confirmed with present staff. Received instructions for my participation in the procedure from the performing physician.  

## 2022-07-21 NOTE — Progress Notes (Signed)
Pt's states no medical or surgical changes since previsit or office visit. 

## 2022-07-22 ENCOUNTER — Telehealth: Payer: Self-pay | Admitting: *Deleted

## 2022-07-22 NOTE — Telephone Encounter (Signed)
  Follow up Call-     07/21/2022    7:40 AM  Call back number  Post procedure Call Back phone  # [phone]  Permission to leave phone message Yes     Patient questions:  Do you have a fever, pain , or abdominal swelling? No. Pain Score  0 *  Have you tolerated food without any problems? Yes.    Have you been able to return to your normal activities? Yes.    Do you have any questions about your discharge instructions: Diet   No. Medications  No. Follow up visit  No.  Do you have questions or concerns about your Care? No.  Actions: * If pain score is 4 or above: No action needed, pain <4.

## 2022-07-27 NOTE — Progress Notes (Signed)
Mr. Justin Frank,   The two polyps that I removed during your recent procedure were completely benign but were proven to be "pre-cancerous" polyps that MAY have grown into cancers if they had not been removed.  Studies shows that at least 20% of women over age 65 and 30% of men over age 70 have pre-cancerous polyps.  Because of the suboptimal bowel prep and your history of recurrent polyps, I recommend you repeat colonoscopy in 3 years.  If you are still taking Ozempic at that time, you may need to do extra bowel prep to increase your chances of a successful clean out.  If you develop any new rectal bleeding, abdominal pain or significant bowel habit changes, please contact me before then.

## 2022-08-24 ENCOUNTER — Encounter: Payer: Self-pay | Admitting: Medical-Surgical

## 2022-08-24 ENCOUNTER — Ambulatory Visit (INDEPENDENT_AMBULATORY_CARE_PROVIDER_SITE_OTHER): Payer: No Typology Code available for payment source | Admitting: Medical-Surgical

## 2022-08-24 ENCOUNTER — Telehealth: Payer: Self-pay | Admitting: Medical-Surgical

## 2022-08-24 VITALS — BP 102/64 | HR 81 | Resp 20 | Ht 64.0 in | Wt 143.0 lb

## 2022-08-24 DIAGNOSIS — J329 Chronic sinusitis, unspecified: Secondary | ICD-10-CM | POA: Diagnosis not present

## 2022-08-24 DIAGNOSIS — J40 Bronchitis, not specified as acute or chronic: Secondary | ICD-10-CM

## 2022-08-24 MED ORDER — HYDROCOD POLI-CHLORPHE POLI ER 10-8 MG/5ML PO SUER: 5.0000 mL | Freq: Two times a day (BID) | ORAL | 0 refills | Status: DC | PRN

## 2022-08-24 MED ORDER — GUAIFENESIN-CODEINE 100-10 MG/5ML PO SYRP: 5.0000 mL | ORAL_SOLUTION | Freq: Three times a day (TID) | ORAL | 0 refills | Status: DC | PRN

## 2022-08-24 MED ORDER — AZITHROMYCIN 250 MG PO TABS: ORAL_TABLET | ORAL | 0 refills | Status: AC

## 2022-08-24 NOTE — Telephone Encounter (Signed)
Patient called he can't get his guaifenesin-codeine100-10mg /39ml     the pharmacy is out of stock could you prescribe something else instead  [facility]  [address] at [facility] of [address] and [employer] [place] phone number is  [phone]

## 2022-08-24 NOTE — Progress Notes (Signed)
        Established patient visit  History, exam, impression, and plan:  1. Sinobronchitis Pleasant 67 year old male accompanied by his wife presenting today with reports of 1 week of upper respiratory symptoms including sinus congestion, sneezing, rhinorrhea, sore throat, and cough productive of thick yellow mucus.  Feels that his cough is very deep and he has chest congestion.  Has not tried any over-the-counter medications due to worries about interactions with his prescribed medicines.  Denies fever, chills, nausea, vomiting, chest pain, shortness of breath, and diarrhea.  Thinks he was exposed to be a friend who had recently traveled to [place].  See below for physical exam.  Treated for sinobronchitis with azithromycin 500 mg today followed by 250 mg daily x 4 days.  Adding Robitussin AC for nighttime cough suppression.  Stay well-hydrated.  Okay to use Flonase and Atrovent nasal sprays for sinus congestion as needed.  Physical Exam Vitals and nursing note reviewed.  Constitutional:      General: He is not in acute distress.    Appearance: Normal appearance. He is not ill-appearing.  HENT:     Head: Normocephalic and atraumatic.     Right Ear: Ear canal and external ear normal. A middle ear effusion is present.     Left Ear: Ear canal and external ear normal. A middle ear effusion is present.     Nose: Nose normal.     Right Sinus: No maxillary sinus tenderness or frontal sinus tenderness.     Left Sinus: No maxillary sinus tenderness or frontal sinus tenderness.     Mouth/Throat:     Mouth: Mucous membranes are moist.     Pharynx: Uvula midline. Posterior oropharyngeal erythema present.     Tonsils: No tonsillar exudate or tonsillar abscesses.  Cardiovascular:     Rate and Rhythm: Normal rate and regular rhythm.     Pulses: Normal pulses.     Heart sounds: Normal heart sounds. No murmur heard.    No friction rub. No gallop.  Pulmonary:     Effort: Pulmonary effort is normal. No  respiratory distress.     Breath sounds: Normal breath sounds.  Musculoskeletal:     Cervical back: Neck supple.  Lymphadenopathy:     Cervical: No cervical adenopathy.  Skin:    General: Skin is warm and dry.  Neurological:     Mental Status: He is alert and oriented to person, place, and time.  Psychiatric:        Mood and Affect: Mood normal.        Behavior: Behavior normal.        Thought Content: Thought content normal.        Judgment: Judgment normal.    Procedures performed this visit: None.  Return if symptoms worsen or fail to improve.  __________________________________ Thayer Ohm, DNP, APRN, FNP-BC Primary Care and Sports Medicine [facility]

## 2022-08-31 NOTE — Telephone Encounter (Signed)
Attempted call to patient . Busy signal. Could not leave a voice mail message.

## 2022-09-06 ENCOUNTER — Other Ambulatory Visit: Payer: Self-pay | Admitting: Medical-Surgical

## 2022-09-06 DIAGNOSIS — E119 Type 2 diabetes mellitus without complications: Secondary | ICD-10-CM

## 2022-10-02 ENCOUNTER — Ambulatory Visit (INDEPENDENT_AMBULATORY_CARE_PROVIDER_SITE_OTHER): Payer: No Typology Code available for payment source

## 2022-10-02 ENCOUNTER — Ambulatory Visit (INDEPENDENT_AMBULATORY_CARE_PROVIDER_SITE_OTHER): Payer: No Typology Code available for payment source | Admitting: Sports Medicine

## 2022-10-02 DIAGNOSIS — M5030 Other cervical disc degeneration, unspecified cervical region: Secondary | ICD-10-CM | POA: Diagnosis not present

## 2022-10-02 MED ORDER — PREGABALIN 75 MG PO CAPS
ORAL_CAPSULE | ORAL | 3 refills | Status: DC
Start: 2022-10-02 — End: 2023-11-25

## 2022-10-02 MED ORDER — PREDNISONE 50 MG PO TABS
ORAL_TABLET | ORAL | 0 refills | Status: DC
Start: 2022-10-02 — End: 2022-11-12

## 2022-10-02 NOTE — Assessment & Plan Note (Signed)
Drexel is a very pleasant 67 year old male, he does have a history of lumbar DDD, more recently he has had pain in his neck right-sided with radiation to the right periscapular region, trapezius and down to the upper lateral arm but not past the elbow, described as throbbing and better with abduction of the right shoulder. On exam he has for the most part unrevealing shoulder exam raising the suspicion of a cervical pain generator. We will start conservatively with prednisone, x-rays, home PT, I also think he is developing tachyphylaxis to gabapentin so we will switch him to Lyrica in an up taper. Return to see me in 6 weeks, MR for epidural planning if not better.

## 2022-10-02 NOTE — Progress Notes (Signed)
    Procedures performed today:    None.  Independent interpretation of notes and tests performed by another provider:   None.  Brief History, Exam, Impression, and Recommendations:    DDD (degenerative disc disease), cervical [name] is a very pleasant 67 year old male, he does have a history of lumbar DDD, more recently he has had pain in his neck right-sided with radiation to the right periscapular region, trapezius and down to the upper lateral arm but not past the elbow, described as throbbing and better with abduction of the right shoulder. On exam he has for the most part unrevealing shoulder exam raising the suspicion of a cervical pain generator. We will start conservatively with prednisone, x-rays, home PT, I also think he is developing tachyphylaxis to gabapentin so we will switch him to Lyrica in an up taper. Return to see me in 6 weeks, MR for epidural planning if not better.    ____________________________________________ Justin Frank. Justin Frank, M.D., ABFM., CAQSM., AME. Primary Care and Sports Medicine [facility]  Adjunct Professor of Family Medicine  [place] [employer] [facility] [employer]  Restaurant manager, fast food

## 2022-10-11 ENCOUNTER — Other Ambulatory Visit: Payer: Self-pay | Admitting: Medical-Surgical

## 2022-11-12 ENCOUNTER — Ambulatory Visit (INDEPENDENT_AMBULATORY_CARE_PROVIDER_SITE_OTHER): Payer: No Typology Code available for payment source | Admitting: Sports Medicine

## 2022-11-12 ENCOUNTER — Encounter: Payer: Self-pay | Admitting: Sports Medicine

## 2022-11-12 DIAGNOSIS — M5030 Other cervical disc degeneration, unspecified cervical region: Secondary | ICD-10-CM | POA: Diagnosis not present

## 2022-11-12 DIAGNOSIS — M160 Bilateral primary osteoarthritis of hip: Secondary | ICD-10-CM | POA: Diagnosis not present

## 2022-11-12 DIAGNOSIS — M542 Cervicalgia: Secondary | ICD-10-CM

## 2022-11-12 MED ORDER — HYDROCODONE-ACETAMINOPHEN 5-325 MG PO TABS: 1.0000 | ORAL_TABLET | Freq: Three times a day (TID) | ORAL | 0 refills | Status: DC | PRN

## 2022-11-12 NOTE — Assessment & Plan Note (Addendum)
Justin Frank returns, he has known cervical DDD, right side pain, we did prednisone, home physical therapy for greater than 6 weeks without improvement. Proceeding with MRI for epidural planning, likely right C6-C7 interlaminar. Rodman would like me to order the epidural as soon as I see the MRI results. Short course of hydrocodone to hold him over the meantime.

## 2022-11-12 NOTE — Assessment & Plan Note (Signed)
Justin Frank also has bilateral hip osteoarthritis, last injection on the right was February 2022, left hip was last injected April 2024. We will give him some hydrocodone to hold him over in the meantime and he would like to schedule sometime maybe for next Friday for bilateral hip joint injections.

## 2022-11-12 NOTE — Progress Notes (Signed)
    Procedures performed today:    None.  Independent interpretation of notes and tests performed by another provider:   None.  Brief History, Exam, Impression, and Recommendations:    DDD (degenerative disc disease), cervical Bert returns, he has known cervical DDD, right side pain, we did prednisone, home physical therapy for greater than 6 weeks without improvement. Proceeding with MRI for epidural planning, likely right C6-C7 interlaminar. Nevan would like me to order the epidural as soon as I see the MRI results. Short course of hydrocodone to hold him over the meantime.  Primary osteoarthritis of both hips Avrumy also has bilateral hip osteoarthritis, last injection on the right was February 2022, left hip was last injected April 2024. We will give him some hydrocodone to hold him over in the meantime and he would like to schedule sometime maybe for next Friday for bilateral hip joint injections.    ____________________________________________ [name]. Benjamin Stain, M.D., ABFM., CAQSM., AME. Primary Care and Sports Medicine [facility]  Adjunct Professor of Family Medicine  [place] [employer] [facility] [employer]  Restaurant manager, fast food

## 2022-11-13 ENCOUNTER — Ambulatory Visit: Payer: No Typology Code available for payment source | Admitting: Sports Medicine

## 2022-11-20 ENCOUNTER — Ambulatory Visit (INDEPENDENT_AMBULATORY_CARE_PROVIDER_SITE_OTHER): Payer: No Typology Code available for payment source | Admitting: Sports Medicine

## 2022-11-20 ENCOUNTER — Other Ambulatory Visit (INDEPENDENT_AMBULATORY_CARE_PROVIDER_SITE_OTHER): Payer: No Typology Code available for payment source

## 2022-11-20 ENCOUNTER — Encounter: Payer: Self-pay | Admitting: Sports Medicine

## 2022-11-20 DIAGNOSIS — M160 Bilateral primary osteoarthritis of hip: Secondary | ICD-10-CM

## 2022-11-20 NOTE — Progress Notes (Signed)
    Procedures performed today:    Procedure: Real-time Ultrasound Guided injection of the left hip Device: Samsung HS60  Verbal informed consent obtained.  Time-out conducted.  Noted no overlying erythema, induration, or other signs of local infection.  Skin prepped in a sterile fashion.  Local anesthesia: Topical Ethyl chloride.  With sterile technique and under real time ultrasound guidance: Arthritic joint noted, 1 cc Kenalog 40, 2 cc lidocaine, 2 cc bupivacaine injected easily Completed without difficulty  Advised to call if fevers/chills, erythema, induration, drainage, or persistent bleeding.  Images permanently stored and available for review in PACS.  Impression: Technically successful ultrasound guided injection.   Procedure: Real-time Ultrasound Guided injection of the right hip Device: Samsung HS60  Verbal informed consent obtained.  Time-out conducted.  Noted no overlying erythema, induration, or other signs of local infection.  Skin prepped in a sterile fashion.  Local anesthesia: Topical Ethyl chloride.  With sterile technique and under real time ultrasound guidance: Arthritic joint noted, 1 cc Kenalog 40, 2 cc lidocaine, 2 cc bupivacaine injected easily Completed without difficulty  Advised to call if fevers/chills, erythema, induration, drainage, or persistent bleeding.  Images permanently stored and available for review in PACS.  Impression: Technically successful ultrasound guided injection.  Independent interpretation of notes and tests performed by another provider:   None.  Brief History, Exam, Impression, and Recommendations:    Primary osteoarthritis of both hips Justin Frank has bilateral hip osteoarthritis, last injection on the right was February 2022, last left hip injection was April 2024, we did bilateral hip joint injections today. Return to see me as needed.    ____________________________________________ [name]. Benjamin Stain, M.D., ABFM.,  CAQSM., AME. Primary Care and Sports Medicine [facility]  Adjunct Professor of Family Medicine  [place] [employer] [facility] [employer]  Restaurant manager, fast food

## 2022-11-20 NOTE — Assessment & Plan Note (Signed)
Justin Frank has bilateral hip osteoarthritis, last injection on the right was February 2022, last left hip injection was April 2024, we did bilateral hip joint injections today. Return to see me as needed.

## 2022-11-22 ENCOUNTER — Ambulatory Visit: Payer: No Typology Code available for payment source

## 2022-11-22 DIAGNOSIS — G8929 Other chronic pain: Secondary | ICD-10-CM

## 2022-11-22 DIAGNOSIS — M542 Cervicalgia: Secondary | ICD-10-CM | POA: Diagnosis not present

## 2022-11-22 DIAGNOSIS — M5030 Other cervical disc degeneration, unspecified cervical region: Secondary | ICD-10-CM

## 2022-11-26 DIAGNOSIS — M160 Bilateral primary osteoarthritis of hip: Secondary | ICD-10-CM | POA: Diagnosis not present

## 2022-11-26 MED ORDER — TRIAMCINOLONE ACETONIDE 40 MG/ML IJ SUSP
80.0000 mg | Freq: Once | INTRAMUSCULAR | Status: AC
Start: 2022-11-26 — End: 2022-11-26
  Administered 2022-11-26: 80 mg via INTRAMUSCULAR

## 2022-11-26 NOTE — Addendum Note (Signed)
Addended by: Holland Falling on: 11/26/2022 08:47 AM   Modules accepted: Orders

## 2022-12-05 ENCOUNTER — Other Ambulatory Visit: Payer: Self-pay | Admitting: Medical-Surgical

## 2022-12-05 DIAGNOSIS — E119 Type 2 diabetes mellitus without complications: Secondary | ICD-10-CM

## 2022-12-10 ENCOUNTER — Ambulatory Visit (INDEPENDENT_AMBULATORY_CARE_PROVIDER_SITE_OTHER): Payer: No Typology Code available for payment source | Admitting: Sports Medicine

## 2022-12-10 ENCOUNTER — Encounter: Payer: Self-pay | Admitting: Sports Medicine

## 2022-12-10 DIAGNOSIS — M5030 Other cervical disc degeneration, unspecified cervical region: Secondary | ICD-10-CM | POA: Diagnosis not present

## 2022-12-10 NOTE — Assessment & Plan Note (Signed)
Known cervical DDD, right sided pain, he is failed prednisone, home physical therapy, MRI did show C5-C7 degenerative disc disease. Due to right sided symptoms we will proceed with right C6-C7 interlaminar epidural, return to see me 6 weeks after the epidural. I did go over the images today with Justin Frank.

## 2022-12-10 NOTE — Progress Notes (Signed)
    Procedures performed today:    None.  Independent interpretation of notes and tests performed by another provider:   MRI personally reviewed, there is C5-C7 cervical DDD with indentation of the anterior thecal sac with mild spinal stenosis at this level.  Brief History, Exam, Impression, and Recommendations:    DDD (degenerative disc disease), cervical Known cervical DDD, right sided pain, he is failed prednisone, home physical therapy, MRI did show C5-C7 degenerative disc disease. Due to right sided symptoms we will proceed with right C6-C7 interlaminar epidural, return to see me 6 weeks after the epidural. I did go over the images today with Justin Frank.  This is a chronic process not at goal.  ____________________________________________ [name]. Benjamin Stain, M.D., ABFM., CAQSM., AME. Primary Care and Sports Medicine [facility]  Adjunct Professor of Family Medicine  [employer] [employer] [facility] [employer]  Restaurant manager, fast food

## 2022-12-11 ENCOUNTER — Ambulatory Visit (INDEPENDENT_AMBULATORY_CARE_PROVIDER_SITE_OTHER): Payer: No Typology Code available for payment source | Admitting: Medical-Surgical

## 2022-12-11 ENCOUNTER — Encounter: Payer: Self-pay | Admitting: Medical-Surgical

## 2022-12-11 VITALS — BP 98/60 | HR 87 | Resp 20 | Ht 64.0 in | Wt 141.9 lb

## 2022-12-11 DIAGNOSIS — E785 Hyperlipidemia, unspecified: Secondary | ICD-10-CM

## 2022-12-11 DIAGNOSIS — Z23 Encounter for immunization: Secondary | ICD-10-CM | POA: Diagnosis not present

## 2022-12-11 DIAGNOSIS — E119 Type 2 diabetes mellitus without complications: Secondary | ICD-10-CM

## 2022-12-11 DIAGNOSIS — D560 Alpha thalassemia: Secondary | ICD-10-CM

## 2022-12-11 DIAGNOSIS — I10 Essential (primary) hypertension: Secondary | ICD-10-CM | POA: Diagnosis not present

## 2022-12-11 DIAGNOSIS — E1169 Type 2 diabetes mellitus with other specified complication: Secondary | ICD-10-CM

## 2022-12-11 DIAGNOSIS — E559 Vitamin D deficiency, unspecified: Secondary | ICD-10-CM

## 2022-12-11 DIAGNOSIS — Z7984 Long term (current) use of oral hypoglycemic drugs: Secondary | ICD-10-CM

## 2022-12-11 LAB — POCT GLYCOSYLATED HEMOGLOBIN (HGB A1C)
HbA1c, POC (controlled diabetic range): 5.8 % (ref 0.0–7.0)
Hemoglobin A1C: 5.8 % — AB (ref 4.0–5.6)

## 2022-12-11 MED ORDER — OMEPRAZOLE 40 MG PO CPDR: 40.0000 mg | DELAYED_RELEASE_CAPSULE | Freq: Every day | ORAL | 1 refills | Status: DC

## 2022-12-11 MED ORDER — IPRATROPIUM BROMIDE 0.03 % NA SOLN: NASAL | 5 refills | Status: DC

## 2022-12-11 MED ORDER — OZEMPIC (1 MG/DOSE) 4 MG/3ML ~~LOC~~ SOPN: 1.0000 mg | PEN_INJECTOR | SUBCUTANEOUS | 3 refills | Status: DC

## 2022-12-11 NOTE — Progress Notes (Signed)
        Established patient visit  History, exam, impression, and plan:  1. Essential hypertension Pleasant 67 year old male presenting today with a history of hypertension currently treated with amlodipine 10 mg daily.  Tolerating medication well without side effects.  No lower extremity edema or concerning symptoms today.  Not regularly checking blood pressure at home but does follow a low-sodium diet and stays physically active.  Blood pressure at goal today.  Continue amlodipine as prescribed.  Checking labs as below. - CBC - CMP14+EGFR - Lipid panel  2. Controlled type 2 diabetes mellitus without complication, without long-term current use of insulin (HCC) History of type 2 diabetes that has historically been controlled.  He is taking metformin 1000 mg daily and Ozempic 1 mg weekly.  Tolerating both medications well without side effects.  Not checking sugars at home.  He is up-to-date on vision care.  Hemoglobin A1c recheck today at 5.8% indicating excellent control.  Continue metformin and Ozempic as prescribed. - POCT HgB A1C - HM Diabetes Foot Exam  3. Hyperlipidemia associated with type 2 diabetes mellitus (HCC) Taking atorvastatin 40 mg daily, tolerating well without side effects.  Following a low-fat heart healthy diet.  Staying physically active and maintains a healthy weight.  Checking labs as below.  Continue atorvastatin. - CMP14+EGFR - Lipid panel  4. Alpha-thalassemia (HCC) Checking CBC today.  5. Vitamin D deficiency Checking vitamin D. - VITAMIN D 25 Hydroxy (Vit-D Deficiency, Fractures)   Procedures performed this visit: None.  Return in about 6 months (around 06/11/2023) for chronic disease follow up or sooner if needed.  __________________________________ Thayer Ohm, DNP, APRN, FNP-BC Primary Care and Sports Medicine [facility]

## 2022-12-12 LAB — CBC
Hematocrit: 41.2 % (ref 37.5–51.0)
Hemoglobin: 13.1 g/dL (ref 13.0–17.7)
MCH: 23.9 pg — ABNORMAL LOW (ref 26.6–33.0)
MCHC: 31.8 g/dL (ref 31.5–35.7)
MCV: 75 fL — ABNORMAL LOW (ref 79–97)
Platelets: 197 10*3/uL (ref 150–450)
RBC: 5.48 x10E6/uL (ref 4.14–5.80)
RDW: 15.2 % (ref 11.6–15.4)
WBC: 5 10*3/uL (ref 3.4–10.8)

## 2022-12-12 LAB — VITAMIN D 25 HYDROXY (VIT D DEFICIENCY, FRACTURES): Vit D, 25-Hydroxy: 47.1 ng/mL (ref 30.0–100.0)

## 2022-12-12 LAB — CMP14+EGFR
ALT: 39 [IU]/L (ref 0–44)
AST: 36 [IU]/L (ref 0–40)
Albumin: 4.5 g/dL (ref 3.9–4.9)
Alkaline Phosphatase: 79 [IU]/L (ref 44–121)
BUN/Creatinine Ratio: 15 (ref 10–24)
BUN: 15 mg/dL (ref 8–27)
Bilirubin Total: 0.7 mg/dL (ref 0.0–1.2)
CO2: 22 mmol/L (ref 20–29)
Calcium: 9.5 mg/dL (ref 8.6–10.2)
Chloride: 101 mmol/L (ref 96–106)
Creatinine, Ser: 1.01 mg/dL (ref 0.76–1.27)
Globulin, Total: 2.4 g/dL (ref 1.5–4.5)
Glucose: 71 mg/dL (ref 70–99)
Potassium: 3.8 mmol/L (ref 3.5–5.2)
Sodium: 141 mmol/L (ref 134–144)
Total Protein: 6.9 g/dL (ref 6.0–8.5)
eGFR: 83 mL/min/{1.73_m2} (ref >59)

## 2022-12-12 LAB — LIPID PANEL
Chol/HDL Ratio: 1.9 {ratio} (ref 0.0–5.0)
Cholesterol, Total: 170 mg/dL (ref 100–199)
HDL: 89 mg/dL (ref >39)
LDL Chol Calc (NIH): 70 mg/dL (ref 0–99)
Triglycerides: 55 mg/dL (ref 0–149)
VLDL Cholesterol Cal: 11 mg/dL (ref 5–40)

## 2022-12-15 ENCOUNTER — Encounter: Payer: Self-pay | Admitting: Sports Medicine

## 2022-12-16 ENCOUNTER — Encounter: Payer: Self-pay | Admitting: Sports Medicine

## 2022-12-17 NOTE — Discharge Instructions (Signed)

## 2022-12-18 ENCOUNTER — Ambulatory Visit
Admission: RE | Admit: 2022-12-18 | Discharge: 2022-12-18 | Disposition: A | Discharge status: Home / Self Care | Payer: No Typology Code available for payment source | Source: Ambulatory Visit | Attending: Sports Medicine | Admitting: Sports Medicine

## 2022-12-18 DIAGNOSIS — M5030 Other cervical disc degeneration, unspecified cervical region: Secondary | ICD-10-CM

## 2022-12-18 MED ORDER — TRIAMCINOLONE ACETONIDE 40 MG/ML IJ SUSP (RADIOLOGY)
60.0000 mg | Freq: Once | INTRAMUSCULAR | Status: AC
  Administered 2022-12-18: 60 mg via EPIDURAL

## 2022-12-18 MED ORDER — IOPAMIDOL (ISOVUE-M 300) INJECTION 61%
1.0000 mL | Freq: Once | INTRAMUSCULAR | Status: AC
  Administered 2022-12-18: 1 mL via EPIDURAL

## 2023-01-19 ENCOUNTER — Other Ambulatory Visit (INDEPENDENT_AMBULATORY_CARE_PROVIDER_SITE_OTHER): Payer: No Typology Code available for payment source

## 2023-01-19 ENCOUNTER — Encounter: Payer: Self-pay | Admitting: Sports Medicine

## 2023-01-19 ENCOUNTER — Ambulatory Visit (INDEPENDENT_AMBULATORY_CARE_PROVIDER_SITE_OTHER): Payer: No Typology Code available for payment source | Admitting: Sports Medicine

## 2023-01-19 DIAGNOSIS — L821 Other seborrheic keratosis: Secondary | ICD-10-CM | POA: Diagnosis not present

## 2023-01-19 DIAGNOSIS — M7541 Impingement syndrome of right shoulder: Secondary | ICD-10-CM

## 2023-01-19 DIAGNOSIS — M5030 Other cervical disc degeneration, unspecified cervical region: Secondary | ICD-10-CM | POA: Diagnosis not present

## 2023-01-19 MED ORDER — TRIAMCINOLONE ACETONIDE 40 MG/ML IJ SUSP
40.0000 mg | Freq: Once | INTRAMUSCULAR | Status: AC
Start: 2023-01-19 — End: 2023-01-19
  Administered 2023-01-19: 40 mg via INTRAMUSCULAR

## 2023-01-19 NOTE — Assessment & Plan Note (Signed)
Noted verruca seborrheica right posterior head, cryotherapy as above. Return to see me in a month for this.

## 2023-01-19 NOTE — Progress Notes (Signed)
    Procedures performed today:    Procedure:  Cryodestruction of right sided verruca seborrheica scalp. Consent obtained and verified. Time-out conducted. Noted no overlying erythema, induration, or other signs of local infection. Completed without difficulty using Cryo-Gun. Advised to call if fevers/chills, erythema, induration, drainage, or persistent bleeding.  Procedure: Real-time Ultrasound Guided injection of the right subacromial bursa Device: Samsung HS60  Verbal informed consent obtained.  Time-out conducted.  Noted no overlying erythema, induration, or other signs of local infection.  Skin prepped in a sterile fashion.  Local anesthesia: Topical Ethyl chloride.  With sterile technique and under real time ultrasound guidance: Intact cuff noted, mild bursitis, 1 cc kenalog 40, 1 cc lidocaine, 1 cc bupivacaine injected easily.  Completed without difficulty  Advised to call if fevers/chills, erythema, induration, drainage, or persistent bleeding.  Images permanently stored and available for review in PACS.  Impression: Technically successful ultrasound guided injection.  Independent interpretation of notes and tests performed by another provider:   None.  Brief History, Exam, Impression, and Recommendations:    Impingement syndrome, shoulder, right Recurrence of impingement symptoms, he had good relief after an injection in April of this year, due to failure of conservative treatment and multiple months of home physical therapy we will proceed with MRI. I did a repeat subacromial injection today. We will have him restart his home PT.  DDD (degenerative disc disease), cervical Doing really well after a C6-C7 interlaminar epidural.  Verruca seborrheica Noted verruca seborrheica right posterior head, cryotherapy as above. Return to see me in a month for this.    ____________________________________________ [name]. Benjamin Stain, M.D., ABFM., CAQSM., AME. Primary Care  and Sports Medicine [facility]  Adjunct Professor of Family Medicine  [employer]  Restaurant manager, fast food

## 2023-01-19 NOTE — Assessment & Plan Note (Signed)
Doing really well after a C6-C7 interlaminar epidural.

## 2023-01-19 NOTE — Assessment & Plan Note (Signed)
Recurrence of impingement symptoms, he had good relief after an injection in April of this year, due to failure of conservative treatment and multiple months of home physical therapy we will proceed with MRI. I did a repeat subacromial injection today. We will have him restart his home PT.

## 2023-02-27 ENCOUNTER — Ambulatory Visit: Payer: No Typology Code available for payment source

## 2023-02-27 DIAGNOSIS — M7541 Impingement syndrome of right shoulder: Secondary | ICD-10-CM

## 2023-03-05 ENCOUNTER — Other Ambulatory Visit: Payer: Self-pay | Admitting: Medical-Surgical

## 2023-03-05 DIAGNOSIS — E119 Type 2 diabetes mellitus without complications: Secondary | ICD-10-CM

## 2023-03-25 ENCOUNTER — Other Ambulatory Visit: Payer: Self-pay | Admitting: Medical-Surgical

## 2023-03-25 DIAGNOSIS — E119 Type 2 diabetes mellitus without complications: Secondary | ICD-10-CM

## 2023-04-03 ENCOUNTER — Other Ambulatory Visit: Payer: Self-pay | Admitting: Medical-Surgical

## 2023-04-13 ENCOUNTER — Encounter: Payer: Self-pay | Admitting: Sports Medicine

## 2023-04-15 ENCOUNTER — Other Ambulatory Visit (INDEPENDENT_AMBULATORY_CARE_PROVIDER_SITE_OTHER): Payer: No Typology Code available for payment source

## 2023-04-15 ENCOUNTER — Encounter: Payer: Self-pay | Admitting: Sports Medicine

## 2023-04-15 ENCOUNTER — Ambulatory Visit: Payer: No Typology Code available for payment source | Admitting: Sports Medicine

## 2023-04-15 DIAGNOSIS — M7541 Impingement syndrome of right shoulder: Secondary | ICD-10-CM

## 2023-04-15 NOTE — Progress Notes (Signed)
    Procedures performed today:    Procedure: Real-time Ultrasound Guided injection of the right subacromial bursa Device: Samsung HS60  Verbal informed consent obtained.  Time-out conducted.  Noted no overlying erythema, induration, or other signs of local infection.  Skin prepped in a sterile fashion.  Local anesthesia: Topical Ethyl chloride.  With sterile technique and under real time ultrasound guidance: Intact cuff noted, 1 cc Kenalog 40, 1 cc lidocaine, 1 cc bupivacaine injected easily Completed without difficulty  Advised to call if fevers/chills, erythema, induration, drainage, or persistent bleeding.  Images permanently stored and available for review in PACS.  Impression: Technically successful ultrasound guided injection.  Independent interpretation of notes and tests performed by another provider:   None.  Brief History, Exam, Impression, and Recommendations:    Impingement syndrome, shoulder, right Recurrence of right shoulder pain, has done really well with intermittent subacromial injections, he has had months of home physical therapy, MRI did show glenohumeral very minimal arthritic changes and some cuff tendinosis. Repeat subacromial injection today, return to see me as needed.    ____________________________________________ [name]. Benjamin Stain, M.D., ABFM., CAQSM., AME. Primary Care and Sports Medicine [facility]  Adjunct Professor of Family Medicine  [place] [employer] [facility] [employer]  Restaurant manager, fast food

## 2023-04-15 NOTE — Assessment & Plan Note (Signed)
Recurrence of right shoulder pain, has done really well with intermittent subacromial injections, he has had months of home physical therapy, MRI did show glenohumeral very minimal arthritic changes and some cuff tendinosis. Repeat subacromial injection today, return to see me as needed.

## 2023-04-16 DIAGNOSIS — M7541 Impingement syndrome of right shoulder: Secondary | ICD-10-CM | POA: Diagnosis not present

## 2023-04-16 MED ORDER — TRIAMCINOLONE ACETONIDE 40 MG/ML IJ SUSP
40.0000 mg | Freq: Once | INTRAMUSCULAR | Status: AC
Start: 2023-04-16 — End: 2023-04-16
  Administered 2023-04-16: 40 mg via INTRAMUSCULAR

## 2023-04-16 NOTE — Code Documentation (Signed)
done

## 2023-04-16 NOTE — Addendum Note (Signed)
Addended by: Carren Rang A on: 04/16/2023 09:58 AM   Modules accepted: Orders

## 2023-05-08 ENCOUNTER — Encounter: Payer: Self-pay | Admitting: Medical-Surgical

## 2023-05-10 NOTE — Telephone Encounter (Signed)
 Justin Frank (Key: [id number]) - [id number] Ozempic (1 MG/DOSE) 4MG Justin Frank pen-injectors status: PA Response - ApprovedCreated: March 10th, 2025Sent: March 10th, 2025

## 2023-05-10 NOTE — Telephone Encounter (Signed)
 PA required for Ozempic  Initiated PA via cover my meds Audel Coakley (Key: [id number]) Ozempic (1 MG/DOSE) 4MG Ronny Bacon pen-injectors Form [employer] Electronic PA Form [phone] NCPDP)

## 2023-05-25 ENCOUNTER — Encounter: Payer: Self-pay | Admitting: Medical-Surgical

## 2023-05-25 ENCOUNTER — Ambulatory Visit: Admitting: Medical-Surgical

## 2023-05-25 VITALS — BP 110/68 | HR 77 | Resp 20 | Ht 64.0 in | Wt 147.0 lb

## 2023-05-25 DIAGNOSIS — E785 Hyperlipidemia, unspecified: Secondary | ICD-10-CM

## 2023-05-25 DIAGNOSIS — K219 Gastro-esophageal reflux disease without esophagitis: Secondary | ICD-10-CM

## 2023-05-25 DIAGNOSIS — K5900 Constipation, unspecified: Secondary | ICD-10-CM | POA: Insufficient documentation

## 2023-05-25 DIAGNOSIS — Z7984 Long term (current) use of oral hypoglycemic drugs: Secondary | ICD-10-CM

## 2023-05-25 DIAGNOSIS — I10 Essential (primary) hypertension: Secondary | ICD-10-CM | POA: Diagnosis not present

## 2023-05-25 DIAGNOSIS — E119 Type 2 diabetes mellitus without complications: Secondary | ICD-10-CM

## 2023-05-25 DIAGNOSIS — E1169 Type 2 diabetes mellitus with other specified complication: Secondary | ICD-10-CM

## 2023-05-25 LAB — POCT GLYCOSYLATED HEMOGLOBIN (HGB A1C)
HbA1c, POC (controlled diabetic range): 5.7 % (ref 0.0–7.0)
Hemoglobin A1C: 5.7 % — AB (ref 4.0–5.6)

## 2023-05-25 MED ORDER — AMLODIPINE BESYLATE 10 MG PO TABS: ORAL_TABLET | ORAL | 3 refills | Status: AC

## 2023-05-25 MED ORDER — ATORVASTATIN CALCIUM 40 MG PO TABS: 40.0000 mg | ORAL_TABLET | Freq: Every day | ORAL | 3 refills | Status: AC

## 2023-05-25 MED ORDER — LINACLOTIDE 145 MCG PO CAPS: 145.0000 ug | ORAL_CAPSULE | Freq: Every day | ORAL | 1 refills | Status: AC

## 2023-05-25 MED ORDER — VALSARTAN 160 MG PO TABS: 160.0000 mg | ORAL_TABLET | Freq: Every day | ORAL | 3 refills | Status: AC

## 2023-05-25 MED ORDER — OMEPRAZOLE 40 MG PO CPDR: 40.0000 mg | DELAYED_RELEASE_CAPSULE | Freq: Every day | ORAL | 3 refills | Status: AC

## 2023-05-25 NOTE — Progress Notes (Unsigned)
 Subjective:  Patient ID: Justin Frank, male    DOB: Dec 17, 1955, 68 y.o.   MRN: [id number]  Patient Care Team: Christen Butter, NP as PCP - General (Nurse Practitioner)   Chief Complaint:  Medical Management of Chronic Issues and Diabetes   HPI:  Justin Frank is a 68 y.o. male presenting on 05/25/2023 for Medical Management of Chronic Issues and Diabetes   History, Exam,  Impression and Plan  1. Essential hypertension (Primary) Presents with a history of chronic hypertension managed with valsartan 160 mg and amlodipine 10 mg daily.  Reports walking 10-15,000 steps per day at work and making healthy dietary choices that include fresh vegetables, less processed sugars, and lean meats.  Denies CP, SOB, palpitations, lower extremity edema, dizziness, headaches, or vision changes.  BP is well-managed today with BP of 110/68 with heart rate of 77.  Continue regimen of diet exercise and medications of valsartan and amlodipine.  Refill amlodipine and valsartan today. -     Refill and continue amLODipine (NORVASC) 10 MG tablet; TAKE 1 TABLET(10 MG) BY MOUTH DAILY  Dispense: 90 tablet; Refill: 3 -     Refill and continue valsartan (DIOVAN) 160 MG tablet; Take 1 tablet (160 mg total) by mouth daily.  2. Hyperlipidemia associated with type 2 diabetes mellitus (HCC) 3. Controlled type 2 diabetes mellitus without complication, without long-term current use of insulin (HCC) Presents today for management of hyperlipidemia and DM2.  Takes semaglutide 1 mg weekly injection, metformin 1000 mg PO daily, and atorvastatin 40 mg daily..  Patient is tolerating medications without GI side effects or musculoskeletal complaints.  As stated above above patient is making healthy dietary choices, getting appropriate amount of exercise.  Hemoglobin A1c today is 5.7 down from 5.8.  Lipid panel on 12/11/2022 was normal. Diabetes well controlled and hyperlipidemia historically well controlled. Continue diet,  exercise, and atorvastatin, Semaglutide, and metformin. -       Refill and continue atorvastatin (LIPITOR) 40 MG tablet; Take 1 tablet (40 mg total) by mouth daily.  Dispense: 90 tablet; Refill: 3 knots taken this visit -        POCT HgB A1C   4. Constipation Patient restarted old prescription from 2023 for recent issues with constipation. Patient tolerating Linzess well and is improving bowel patterns.  Feels medication is improving symptoms and requests refill and wishes to continue linaclotide. -     Refill and continue linaclotide (LINZESS) 145 MCG CAPS capsule; Take 1 capsule (145 mcg total) by mouth daily before breakfast.  5. Gastroesophageal reflux disease, unspecified whether esophagitis present Takes omeprazole 40 mg daily.  Denies complication or intolerance of medication and states that his reflux is well-controlled using omeprazole.  Continue omeprazole -     Refill and continue omeprazole (PRILOSEC) 40 MG capsule; Take 1 capsule (40 mg total) by mouth mid thoracic back but actually be done here daily.   Continue all other maintenance medications.    Follow up plan: Return in about 6 months (around 11/25/2023) for DM/HTN/HLD follow up.    Relevant past medical, surgical, family, and social history reviewed and updated as indicated.  Allergies and medications reviewed and updated. Data reviewed: Chart in Epic.   Past Medical History:  Diagnosis Date   Allergy    Asthma    Controlled type 2 diabetes mellitus without complication, without long-term current use of insulin (HCC) 03/22/2017   ED (erectile dysfunction)    GERD (gastroesophageal reflux disease)  History of asbestos exposure 04/29/2017   Hyperlipidemia    Hypertension     Past Surgical History:  Procedure Laterality Date   COLONOSCOPY  07/21/2022   COLONOSCOPY  06/05/2016   HERNIA REPAIR     As per patient - 2012, 2013, 2015    Social History   Socioeconomic History   Marital status: Married     Spouse name: Willa   Number of children: 8   Years of education: 12   Highest education level: High school graduate  Occupational History   Occupation: Chef  Tobacco Use   Smoking status: Former    Current packs/day: 0.00    Types: Cigarettes    Quit date: 03/02/1977    Years since quitting: 46.2   Smokeless tobacco: Never  Vaping Use   Vaping status: Never Used  Substance and Sexual Activity   Alcohol use: No   Drug use: No   Sexual activity: Yes    Partners: Female    Birth control/protection: None  Other Topics Concern   Not on file  Social History Narrative   Not on file   Social Drivers of Health   Financial Resource Strain: Low Risk  (12/11/2022)   Overall Financial Resource Strain (CARDIA)    Difficulty of Paying Living Expenses: Not hard at all  Food Insecurity: No Food Insecurity (12/11/2022)   Hunger Vital Sign    Worried About Running Out of Food in the Last Year: Never true    Ran Out of Food in the Last Year: Never true  Transportation Needs: No Transportation Needs (12/11/2022)   PRAPARE - Administrator, Civil Service (Medical): No    Lack of Transportation (Non-Medical): No  Physical Activity: Insufficiently Active (12/11/2022)   Exercise Vital Sign    Days of Exercise per Week: 5 days    Minutes of Exercise per Session: 10 min  Stress: No Stress Concern Present (12/11/2022)   [employer] of Occupational Health - Occupational Stress Questionnaire    Feeling of Stress : Not at all  Social Connections: Socially Integrated (12/11/2022)   Social Connection and Isolation Panel [NHANES]    Frequency of Communication with Friends and Family: Once a week    Frequency of Social Gatherings with Friends and Family: More than three times a week    Attends Religious Services: More than 4 times per year    Active Member of [employer] or Organizations: Yes    Attends Banker Meetings: More than 4 times per year    Marital Status:  Married  Catering manager Violence: Unknown (02/05/2022)   Received from [employer], [facility]   HITS    Physically Hurt: Not on file    Insult or Talk Down To: Not on file    Threaten Physical Harm: Not on file    Scream or Curse: Not on file    Outpatient Encounter Medications as of 05/25/2023  Medication Sig   cetirizine (ZYRTEC) 10 MG tablet TAKE 1 TABLET(10 MG) BY MOUTH DAILY   fluticasone (FLONASE) 50 MCG/ACT nasal spray SHAKE LIQUID AND USE 1 SPRAY IN EACH NOSTRIL EVERY DAY   HYDROcodone-acetaminophen (NORCO/VICODIN) 5-325 MG tablet Take 1 tablet by mouth every 8 (eight) hours as needed for moderate pain.   ipratropium (ATROVENT) 0.03 % nasal spray USE 2 SPRAYS IN EACH NOSTRIL EVERY 12 HOURS   metFORMIN (GLUCOPHAGE-XR) 500 MG 24 hr tablet TAKE 2 TABLETS(1000 MG) BY MOUTH DAILY WITH BREAKFAST   pregabalin (LYRICA) 75 MG  capsule 1 capsule p.o. nightly for a week then twice daily for a week then 3 times daily   Semaglutide, 1 MG/DOSE, (OZEMPIC, 1 MG/DOSE,) 4 MG/3ML SOPN Inject 1 mg into the skin once a week.   [DISCONTINUED] amLODipine (NORVASC) 10 MG tablet TAKE 1 TABLET(10 MG) BY MOUTH DAILY   [DISCONTINUED] atorvastatin (LIPITOR) 40 MG tablet Take 1 tablet (40 mg total) by mouth daily.   [DISCONTINUED] linaclotide (LINZESS) 145 MCG CAPS capsule Take 145 mcg by mouth daily before breakfast.   [DISCONTINUED] omeprazole (PRILOSEC) 40 MG capsule Take 1 capsule (40 mg total) by mouth daily.   [DISCONTINUED] valsartan (DIOVAN) 160 MG tablet Take 160 mg by mouth daily.   amLODipine (NORVASC) 10 MG tablet TAKE 1 TABLET(10 MG) BY MOUTH DAILY   atorvastatin (LIPITOR) 40 MG tablet Take 1 tablet (40 mg total) by mouth daily.   linaclotide (LINZESS) 145 MCG CAPS capsule Take 1 capsule (145 mcg total) by mouth daily before breakfast.   omeprazole (PRILOSEC) 40 MG capsule Take 1 capsule (40 mg total) by mouth daily.   valsartan (DIOVAN) 160 MG tablet Take 1 tablet (160 mg total) by mouth  daily.   [DISCONTINUED] chlorpheniramine-HYDROcodone (TUSSIONEX) 10-8 MG/5ML Take 5 mLs by mouth every 12 (twelve) hours as needed for cough (cough, will cause drowsiness.). (Patient not taking: Reported on 05/25/2023)   [DISCONTINUED] linaclotide (LINZESS) 145 MCG CAPS capsule Take 1 capsule (145 mcg total) by mouth daily before breakfast. (Patient not taking: Reported on 07/21/2022)   No facility-administered encounter medications on file as of 05/25/2023.    Allergies  Allergen Reactions   Atorvastatin Swelling    " lip swelling "     Lisinopril Cough    Other reaction(s): Cough (ALLERGY/intolerance)    Review of Systems      Objective:  BP 110/68 (BP Location: Left Arm, Cuff Size: Normal)   Pulse 77   Resp 20   Ht 5\' 4"  (1.626 m)   Wt 147 lb (66.7 kg)   SpO2 97%   BMI 25.23 kg/m    Wt Readings from Last 3 Encounters:  05/25/23 147 lb (66.7 kg)  12/11/22 141 lb 14.4 oz (64.4 kg)  08/24/22 143 lb (64.9 kg)    Physical Exam  Results for orders placed or performed in visit on 05/25/23  POCT HgB A1C   Collection Time: 05/25/23  2:29 PM  Result Value Ref Range   Hemoglobin A1C 5.7 (A) 4.0 - 5.6 %   HbA1c POC (<> result, manual entry)     HbA1c, POC (prediabetic range)     HbA1c, POC (controlled diabetic range) 5.7 0.0 - 7.0 %       Pertinent labs & imaging results that were available during my care of the patient were reviewed by me and considered in my medical decision making.   Continue healthy lifestyle choices, including diet (rich in fruits, vegetables, and lean proteins, and low in salt and simple carbohydrates) and exercise (at least 30 minutes of moderate physical activity daily).  The above assessment and management plan was discussed with the patient. The patient verbalized understanding of and has agreed to the management plan. Patient is aware to call the clinic if they develop any new symptoms or if symptoms persist or worsen. Patient is aware when to  return to the clinic for a follow-up visit. Patient educated on when it is appropriate to go to the emergency department.   Maryelizabeth Kaufmann Student AGNP

## 2023-05-26 NOTE — Progress Notes (Signed)
 Medical screening examination/treatment was performed by qualified clinical staff member and as supervising provider I was immediately available for consultation/collaboration. I have reviewed documentation and agree with assessment and plan.  Thayer Ohm, DNP, APRN, FNP-BC [facility] and Sports Medicine

## 2023-06-07 ENCOUNTER — Ambulatory Visit: Payer: Self-pay

## 2023-06-07 LAB — HM DIABETES EYE EXAM

## 2023-06-07 NOTE — Telephone Encounter (Signed)
 Copied from CRM [id number]. Topic: Clinical - Red Word Triage >> Jun 07, 2023  4:22 PM Justin Frank wrote: Red Word that prompted transfer to Nurse Triage: Patient Both hip pain/Last 2-3 days worse in pain/Does have Cortisone injection last 6 mnths ago/ 7/10 pain level/RT shoulder pain/7/10 pain level  Chief Complaint: Bilateral hip pain Symptoms: Pain Frequency: Chronic, flare-up today Pertinent Negatives: Patient denies relief Disposition: [] ED /[] Urgent Care (no appt availability in office) / [x] Appointment(In office/virtual)/ []  [facility]/ [] Home Care/ [] Refused Recommended Disposition /[] [facility]/ []  Follow-up with PCP Additional Notes: Patient called in because he is experiencing a flare-up of chronic hip pain. Patient stated he sees Dr. Karie Schwalbe for injections and is due for another round of injections. Per note from last OV with Dr. Karie Schwalbe, patient is to return as needed for injections. This RN contacted [facility] for scheduling. Patient scheduled for next week with Dr. Karie Schwalbe. Provided care advice and instructed patient to call back if symptoms worsen. Patient complied.   Reason for Disposition  Hip pain is a chronic symptom (recurrent or ongoing AND present > 4 weeks)  Answer Assessment - Initial Assessment Questions 1. LOCATION and RADIATION: "Where is the pain located?"      Bilateral hips and right shoulder 2. QUALITY: "What does the pain feel like?"  (e.g., sharp, dull, aching, burning)     N/A 3. SEVERITY: "How bad is the pain?" "What does it keep you from doing?"   (Scale 1-10; or mild, moderate, severe)   -  MILD (1-3): doesn't interfere with normal activities    -  MODERATE (4-7): interferes with normal activities (e.g., work or school) or awakens from sleep, limping    -  SEVERE (8-10): excruciating pain, unable to do any normal activities, unable to walk     N/A 4. ONSET: "When did the pain start?" "Does it come and go, or is it there all the time?"     Chronic issue,  started flaring-up today 5. WORK OR EXERCISE: "Has there been any recent work or exercise that involved this part of the body?"      States he has been doing a lot more lifting than he should 6. CAUSE: "What do you think is causing the hip pain?"      Chronic issue- osteoarthritis  7. AGGRAVATING FACTORS: "What makes the hip pain worse?" (e.g., walking, climbing stairs, running)     N/A 8. OTHER SYMPTOMS: "Do you have any other symptoms?" (e.g., back pain, pain shooting down leg,  fever, rash)     N/A  Protocols used: Hip Pain-A-AH

## 2023-06-07 NOTE — Telephone Encounter (Signed)
 Please check if patient was scheduled correctly by E2C2. Thanks in advance.

## 2023-06-16 ENCOUNTER — Ambulatory Visit: Admitting: Sports Medicine

## 2023-06-16 ENCOUNTER — Encounter: Payer: Self-pay | Admitting: Sports Medicine

## 2023-06-16 ENCOUNTER — Other Ambulatory Visit (INDEPENDENT_AMBULATORY_CARE_PROVIDER_SITE_OTHER)

## 2023-06-16 DIAGNOSIS — M5030 Other cervical disc degeneration, unspecified cervical region: Secondary | ICD-10-CM

## 2023-06-16 DIAGNOSIS — M7541 Impingement syndrome of right shoulder: Secondary | ICD-10-CM

## 2023-06-16 DIAGNOSIS — M160 Bilateral primary osteoarthritis of hip: Secondary | ICD-10-CM | POA: Diagnosis not present

## 2023-06-16 MED ORDER — HYDROCODONE-ACETAMINOPHEN 5-325 MG PO TABS: 1.0000 | ORAL_TABLET | Freq: Three times a day (TID) | ORAL | 0 refills | Status: DC | PRN

## 2023-06-16 MED ORDER — TRIAMCINOLONE ACETONIDE 40 MG/ML IJ SUSP
80.0000 mg | Freq: Once | INTRAMUSCULAR | Status: AC
Start: 2023-06-16 — End: 2023-06-16
  Administered 2023-06-16: 80 mg via INTRAMUSCULAR

## 2023-06-16 NOTE — Assessment & Plan Note (Signed)
 Shoulder impingement syndrome, minimal arthritic changes on x-ray, last injection was February this year, too soon to do another injection, we will refill his hydrocodone and have him restart his home conditioning.

## 2023-06-16 NOTE — Assessment & Plan Note (Signed)
 Very pleasant 68 year old male, known bilateral hip osteoarthritis, last injection September 2024, repeat bilateral hip joint injections today.

## 2023-06-16 NOTE — Progress Notes (Signed)
    Procedures performed today:    Procedure: Real-time Ultrasound Guided injection of the left hip joint Device: Samsung HS60  Verbal informed consent obtained.  Time-out conducted.  Noted no overlying erythema, induration, or other signs of local infection.  Skin prepped in a sterile fashion.  Local anesthesia: Topical Ethyl chloride.  With sterile technique and under real time ultrasound guidance: Arthritic changes noted 1 cc Kenalog 40, 2 cc lidocaine, 2 cc bupivacaine injected easily Completed without difficulty  Advised to call if fevers/chills, erythema, induration, drainage, or persistent bleeding.  Images permanently stored and available for review in PACS.  Impression: Technically successful ultrasound guided injection.  Procedure: Real-time Ultrasound Guided injection of the right hip joint Device: Samsung HS60  Verbal informed consent obtained.  Time-out conducted.  Noted no overlying erythema, induration, or other signs of local infection.  Skin prepped in a sterile fashion.  Local anesthesia: Topical Ethyl chloride.  With sterile technique and under real time ultrasound guidance: Arthritic changes noted 1 cc Kenalog 40, 2 cc lidocaine, 2 cc bupivacaine injected easily Completed without difficulty  Advised to call if fevers/chills, erythema, induration, drainage, or persistent bleeding.  Images permanently stored and available for review in PACS.  Impression: Technically successful ultrasound guided injection.  Independent interpretation of notes and tests performed by another provider:   None.  Brief History, Exam, Impression, and Recommendations:    Primary osteoarthritis of both hips Very pleasant 68 year old male, known bilateral hip osteoarthritis, last injection September 2024, repeat bilateral hip joint injections today.  Impingement syndrome, shoulder, right Shoulder impingement syndrome, minimal arthritic changes on x-ray, last injection was February this  year, too soon to do another injection, we will refill his hydrocodone and have him restart his home conditioning.    ____________________________________________ Joselyn Nicely. Sandy Crumb, M.D., ABFM., CAQSM., AME. Primary Care and Sports Medicine [facility]  Adjunct Professor of [facility] Medicine  [employer]  Restaurant manager, fast food

## 2023-06-24 ENCOUNTER — Other Ambulatory Visit: Payer: Self-pay | Admitting: Medical-Surgical

## 2023-06-25 ENCOUNTER — Ambulatory Visit: Admitting: Sports Medicine

## 2023-07-29 ENCOUNTER — Other Ambulatory Visit (INDEPENDENT_AMBULATORY_CARE_PROVIDER_SITE_OTHER)

## 2023-07-29 ENCOUNTER — Ambulatory Visit (INDEPENDENT_AMBULATORY_CARE_PROVIDER_SITE_OTHER): Admitting: Sports Medicine

## 2023-07-29 DIAGNOSIS — M7541 Impingement syndrome of right shoulder: Secondary | ICD-10-CM

## 2023-07-29 DIAGNOSIS — M7542 Impingement syndrome of left shoulder: Secondary | ICD-10-CM

## 2023-07-29 MED ORDER — TRIAMCINOLONE ACETONIDE 40 MG/ML IJ SUSP
80.0000 mg | Freq: Once | INTRAMUSCULAR | Status: AC
Start: 2023-07-29 — End: 2023-07-29
  Administered 2023-07-29: 80 mg via INTRA_ARTICULAR

## 2023-07-29 NOTE — Progress Notes (Signed)
    Procedures performed today:    Procedure: Real-time Ultrasound Guided injection of the right subacromial bursa Device: Samsung HS60  Verbal informed consent obtained.  Time-out conducted.  Noted no overlying erythema, induration, or other signs of local infection.  Skin prepped in a sterile fashion.  Local anesthesia: Topical Ethyl chloride.  With sterile technique and under real time ultrasound guidance: Noted minimal rotator cuff tearing, 1 cc Kenalog  40, 1 cc lidocaine , 1 cc bupivacaine injected easily Completed without difficulty  Advised to call if fevers/chills, erythema, induration, drainage, or persistent bleeding.  Images permanently stored and available for review in PACS.  Impression: Technically successful ultrasound guided injection.   Procedure: Real-time Ultrasound Guided injection of the left subacromial bursa Device: Samsung HS60  Verbal informed consent obtained.  Time-out conducted.  Noted no overlying erythema, induration, or other signs of local infection.  Skin prepped in a sterile fashion.  Local anesthesia: Topical Ethyl chloride.  With sterile technique and under real time ultrasound guidance: Noted minimal rotator cuff tearing, 1 cc Kenalog  40, 1 cc lidocaine , 1 cc bupivacaine injected easily Completed without difficulty  Advised to call if fevers/chills, erythema, induration, drainage, or persistent bleeding.  Images permanently stored and available for review in PACS.  Impression: Technically successful ultrasound guided injection.  Independent interpretation of notes and tests performed by another provider:   None.  Brief History, Exam, Impression, and Recommendations:    Impingement syndrome of both shoulders Known shoulder impingement syndrome, minimally arthritic changes on x-ray, last injection was in February, now having recurrence of pain but this time bilaterally, impingement symptoms on exam. Bilateral subacromial injections performed  today, there was a bit of rotator cuff tearing as expected but nothing full-thickness or retracted, return to see me 6 weeks as needed, continue home conditioning.    ____________________________________________ Joselyn Nicely. Sandy Crumb, M.D., ABFM., CAQSM., AME. Primary Care and Sports Medicine [facility]  Adjunct Professor of [facility] Medicine  [employer]  Restaurant manager, fast food

## 2023-07-29 NOTE — Assessment & Plan Note (Signed)
 Known shoulder impingement syndrome, minimally arthritic changes on x-ray, last injection was in February, now having recurrence of pain but this time bilaterally, impingement symptoms on exam. Bilateral subacromial injections performed today, there was a bit of rotator cuff tearing as expected but nothing full-thickness or retracted, return to see me 6 weeks as needed, continue home conditioning.

## 2023-07-29 NOTE — Addendum Note (Signed)
 Addended by: Montgomery Apgar on: 07/29/2023 11:21 AM   Modules accepted: Orders

## 2023-07-30 ENCOUNTER — Ambulatory Visit: Admitting: Sports Medicine

## 2023-08-03 ENCOUNTER — Other Ambulatory Visit: Payer: Self-pay | Admitting: Medical-Surgical

## 2023-08-03 DIAGNOSIS — E119 Type 2 diabetes mellitus without complications: Secondary | ICD-10-CM

## 2023-08-11 ENCOUNTER — Other Ambulatory Visit: Payer: Self-pay | Admitting: Medical-Surgical

## 2023-09-21 ENCOUNTER — Encounter: Payer: Self-pay | Admitting: Family Medicine

## 2023-09-21 ENCOUNTER — Ambulatory Visit: Payer: Self-pay | Admitting: *Deleted

## 2023-09-21 ENCOUNTER — Ambulatory Visit: Admitting: Family Medicine

## 2023-09-21 VITALS — BP 104/58 | Ht 64.0 in | Wt 138.0 lb

## 2023-09-21 DIAGNOSIS — H1011 Acute atopic conjunctivitis, right eye: Secondary | ICD-10-CM

## 2023-09-21 DIAGNOSIS — L509 Urticaria, unspecified: Secondary | ICD-10-CM | POA: Diagnosis not present

## 2023-09-21 DIAGNOSIS — H1010 Acute atopic conjunctivitis, unspecified eye: Secondary | ICD-10-CM | POA: Insufficient documentation

## 2023-09-21 MED ORDER — HYDROXYZINE HCL 25 MG PO TABS: 25.0000 mg | ORAL_TABLET | Freq: Three times a day (TID) | ORAL | 0 refills | Status: AC | PRN

## 2023-09-21 MED ORDER — OLOPATADINE HCL 0.1 % OP SOLN: 1.0000 [drp] | Freq: Two times a day (BID) | OPHTHALMIC | 1 refills | Status: AC

## 2023-09-21 MED ORDER — TRIAMCINOLONE ACETONIDE 0.1 % EX CREA: 1.0000 | TOPICAL_CREAM | Freq: Two times a day (BID) | CUTANEOUS | 0 refills | Status: AC

## 2023-09-21 NOTE — Telephone Encounter (Signed)
   FYI Only or Action Required?: Action required by provider: request for appointment and clinical question for provider.  Patient was last seen in primary care on 07/29/2023 by Curtis Debby PARAS, MD.  Called Nurse Triage reporting Urticaria.  Symptoms began a week ago.  Interventions attempted: OTC medications: benadryl , OTC allergic relief, showers in celsin blue.  Symptoms are: gradually worsening.  Triage Disposition: See Physician Within 24 Hours  Patient/caregiver understands and will follow disposition?: Yes                Copied from CRM [id number]. Topic: Clinical - Red Word Triage >> Sep 21, 2023  9:06 AM Zane F wrote: [facility] that prompted transfer to Nurse Triage:   Possible allergic reaction; hives on arms, under eyes (three bumps formed into one); Symptoms seem to subside and then return while never fully going away.   Symptoms have been going on since Thursday or Friday of last week Reason for Disposition  [1] MODERATE-SEVERE hives (e.g.,hives interfere with normal activities or work) AND [2] not improved after taking antihistamine (e.g., cetirizine , fexofenadine, or loratadine) > 24 hours  Answer Assessment - Initial Assessment Questions No available appt with PCP. Patient wife requesting if patient can be seen today due to hives started to worsen and patient works as Investment banker, operational and hives get worse. Has tried mutliple OTC with no relief of getting rid of hives. Requesting afternoon appt . Scheduled today with other provider.        1. APPEARANCE: What does the rash look like?      Hives raised can feel rash per patient wife on DPR.  2. LOCATION: Where is the rash located?      Arms , under right eye 3 bumps  3. NUMBER: How many hives are there?     Na  4. SIZE: How big are the hives? (e.g., inches, cm, compare to coins) Do they all look the same or do they vary in shape and size?      Smaller approx pea size  5. ONSET: When did the  hives begin? (e.g., hours or days ago)      Last Thursday or Friday  6. ITCHING: Does it itch? If Yes, ask: How bad is the itch?  (e.g., none, mild, moderate, severe)     Moderate to severe 7. RECURRENT PROBLEM: Have you had hives before? If Yes, ask: When was the last time? and What happened that time?      Yes some years ago  8. TRIGGERS: Were you exposed to any new food, plant, cosmetic product or animal just before the hives began?     Possible yard work last week ,  9. OTHER SYMPTOMS: Do you have any other symptoms? (e.g., fever, tongue swelling, difficulty breathing, abdomen pain)     No other sx  10. PREGNANCY: Is there any chance you are pregnant? When was your last menstrual period?       na  Protocols used: Hives-A-AH

## 2023-09-21 NOTE — Assessment & Plan Note (Signed)
 Recommend cetirizine  over benadryl .  May use vistaril  as needed for itching.  Recommend application of triamcinolone  for the next few days.  Red flags reviewed.

## 2023-09-21 NOTE — Progress Notes (Signed)
 JUWAN VENCES - 67 y.o. male MRN [id number]  Date of birth: 08-07-1955  Subjective Chief Complaint  Patient presents with   Rash    HPI JAQUAVEON BILAL is a 68 y.o. male here today with complaint of urticaria.  Also having some itching around the R eye.  Symptoms started about 1 week ago.  Noticed after cutting grass.   He has not changed hygiene products or laundry detergents.  He denies fever, chills or other systemic symptoms.  He has tried benadryl  without much relief.   ROS:  A comprehensive ROS was completed and negative except as noted per HPI  Allergies  Allergen Reactions   Atorvastatin  Swelling     lip swelling      Lisinopril Cough    Other reaction(s): Cough (ALLERGY/intolerance)    Past Medical History:  Diagnosis Date   Allergy    Asthma    Controlled type 2 diabetes mellitus without complication, without long-term current use of insulin (HCC) 03/22/2017   ED (erectile dysfunction)    GERD (gastroesophageal reflux disease)    History of asbestos exposure 04/29/2017   Hyperlipidemia    Hypertension     Past Surgical History:  Procedure Laterality Date   COLONOSCOPY  07/21/2022   COLONOSCOPY  06/05/2016   HERNIA REPAIR     As per patient - 2012, 2013, 2015    Social History   Socioeconomic History   Marital status: Married    Spouse name: Willa   Number of children: 8   Years of education: 12   Highest education level: 12th grade  Occupational History   Occupation: Chef  Tobacco Use   Smoking status: Former    Current packs/day: 0.00    Types: Cigarettes    Quit date: 03/02/1977    Years since quitting: 46.5   Smokeless tobacco: Never  Vaping Use   Vaping status: Never Used  Substance and Sexual Activity   Alcohol use: No   Drug use: No   Sexual activity: Yes    Partners: Female    Birth control/protection: None  Other Topics Concern   Not on file  Social History Narrative   Not on file   Social Drivers of Health    Financial Resource Strain: Low Risk  (06/15/2023)   Overall Financial Resource Strain (CARDIA)    Difficulty of Paying Living Expenses: Not hard at all  Food Insecurity: No Food Insecurity (06/15/2023)   Hunger Vital Sign    Worried About Running Out of Food in the Last Year: Never true    Ran Out of Food in the Last Year: Never true  Transportation Needs: No Transportation Needs (06/15/2023)   PRAPARE - Administrator, Civil Service (Medical): No    Lack of Transportation (Non-Medical): No  Physical Activity: Insufficiently Active (06/15/2023)   Exercise Vital Sign    Days of Exercise per Week: 5 days    Minutes of Exercise per Session: 20 min  Stress: No Stress Concern Present (06/15/2023)   [employer] of Occupational Health - Occupational Stress Questionnaire    Feeling of Stress : Not at all  Social Connections: Socially Integrated (06/15/2023)   Social Connection and Isolation Panel    Frequency of Communication with Friends and Family: More than three times a week    Frequency of Social Gatherings with Friends and Family: Once a week    Attends Religious Services: More than 4 times per year    Active Member of Clubs or  Organizations: Yes    Attends Engineer, structural: More than 4 times per year    Marital Status: Married    Family History  Problem Relation Age of Onset   Hypertension Mother    Hypertension Father    Colon cancer Neg Hx    Esophageal cancer Neg Hx    Rectal cancer Neg Hx    Stomach cancer Neg Hx    Colon polyps Neg Hx     Health Maintenance  Topic Date Due   HIV Screening  Never done   Hepatitis C Screening  Never done   Pneumococcal Vaccine: 50+ Years (2 of 2 - PCV) 06/15/2018   COVID-19 Vaccine (3 - Moderna risk series) 07/05/2019   Diabetic kidney evaluation - Urine ACR  06/11/2023   INFLUENZA VACCINE  10/01/2023   HEMOGLOBIN A1C  11/25/2023   Diabetic kidney evaluation - eGFR measurement  12/11/2023   FOOT EXAM   12/11/2023   OPHTHALMOLOGY EXAM  06/06/2024   Colonoscopy  07/20/2025   DTaP/Tdap/Td (2 - Td or Tdap) 06/15/2027   Zoster Vaccines- Shingrix  Completed   Hepatitis B Vaccines  Aged Out   HPV VACCINES  Aged Out   Meningococcal B Vaccine  Aged Out     ----------------------------------------------------------------------------------------------------------------------------------------------------------------------------------------------------------------- Physical Exam BP (!) 104/58 (Cuff Size: Normal)   Ht 5' 4 (1.626 m)   Wt 138 lb (62.6 kg)   BMI 23.69 kg/m   Physical Exam Constitutional:      Appearance: Normal appearance.  Skin:    Comments: Urticarial lesions on b/l arms.    Neurological:     Mental Status: He is alert.     ------------------------------------------------------------------------------------------------------------------------------------------------------------------------------------------------------------------- Assessment and Plan  Urticaria Recommend cetirizine  over benadryl .  May use vistaril  as needed for itching.  Recommend application of triamcinolone  for the next few days.  Red flags reviewed.   Allergic conjunctivitis Patanol drops sent in.    Meds ordered this encounter  Medications   olopatadine  (PATANOL) 0.1 % ophthalmic solution    Sig: Place 1 drop into the right eye 2 (two) times daily.    Dispense:  5 mL    Refill:  1   triamcinolone  cream (KENALOG ) 0.1 %    Sig: Apply 1 Application topically 2 (two) times daily.    Dispense:  30 g    Refill:  0   hydrOXYzine  (ATARAX ) 25 MG tablet    Sig: Take 1 tablet (25 mg total) by mouth 3 (three) times daily as needed for itching.    Dispense:  30 tablet    Refill:  0    No follow-ups on file.

## 2023-09-21 NOTE — Assessment & Plan Note (Signed)
 Patanol drops sent in.

## 2023-09-21 NOTE — Patient Instructions (Signed)
 Use over the counter cetirizine  instead of benadryl .  Use Hydroxyzine  as needed for itching.  Use cream as needed on arms. Drops for eyes.  Let us  know if not improving.

## 2023-10-21 ENCOUNTER — Other Ambulatory Visit (INDEPENDENT_AMBULATORY_CARE_PROVIDER_SITE_OTHER)

## 2023-10-21 ENCOUNTER — Ambulatory Visit: Admitting: Sports Medicine

## 2023-10-21 DIAGNOSIS — M160 Bilateral primary osteoarthritis of hip: Secondary | ICD-10-CM

## 2023-10-21 DIAGNOSIS — L821 Other seborrheic keratosis: Secondary | ICD-10-CM

## 2023-10-21 MED ORDER — TRIAMCINOLONE ACETONIDE 40 MG/ML IJ SUSP
80.0000 mg | Freq: Once | INTRAMUSCULAR | Status: AC
Start: 2023-10-21 — End: 2023-10-21
  Administered 2023-10-21: 80 mg via INTRA_ARTICULAR

## 2023-10-21 NOTE — Assessment & Plan Note (Signed)
 Seborrheic verruca again noted right posterior head, we did initial cryotherapy back in November of last year, he did well, now having a recurrence. Repeat cryotherapy today.

## 2023-10-21 NOTE — Assessment & Plan Note (Signed)
 Known bilateral hip osteoarthritis, last injection April 2025, now with recurrence of pain, repeat bilateral hip injections today, I would like some updated x-rays, return as needed.

## 2023-10-21 NOTE — Progress Notes (Signed)
    Procedures performed today:    Procedure: Real-time Ultrasound Guided injection of the left hip joint Device: Samsung HS60  Verbal informed consent obtained.  Time-out conducted.  Noted no overlying erythema, induration, or other signs of local infection.  Skin prepped in a sterile fashion.  Local anesthesia: Topical Ethyl chloride.  With sterile technique and under real time ultrasound guidance: Arthritic changes noted 1 cc Kenalog  40, 2 cc lidocaine , 2 cc bupivacaine injected easily Completed without difficulty  Advised to call if fevers/chills, erythema, induration, drainage, or persistent bleeding.  Images permanently stored and available for review in PACS.  Impression: Technically successful ultrasound guided injection.   Procedure: Real-time Ultrasound Guided injection of the right hip joint Device: Samsung HS60  Verbal informed consent obtained.  Time-out conducted.  Noted no overlying erythema, induration, or other signs of local infection.  Skin prepped in a sterile fashion.  Local anesthesia: Topical Ethyl chloride.  With sterile technique and under real time ultrasound guidance: Arthritic changes noted 1 cc Kenalog  40, 2 cc lidocaine , 2 cc bupivacaine injected easily Completed without difficulty  Advised to call if fevers/chills, erythema, induration, drainage, or persistent bleeding.  Images permanently stored and available for review in PACS.  Impression: Technically successful ultrasound guided injection.  Procedure:  Cryodestruction of seborrheic verruca right posterior scalp Consent obtained and verified. Time-out conducted. Noted no overlying erythema, induration, or other signs of local infection. Completed without difficulty using Cryo-Gun. Advised to call if fevers/chills, erythema, induration, drainage, or persistent bleeding.  Independent interpretation of notes and tests performed by another provider:   None.  Brief History, Exam, Impression, and  Recommendations:    Primary osteoarthritis of both hips Known bilateral hip osteoarthritis, last injection April 2025, now with recurrence of pain, repeat bilateral hip injections today, I would like some updated x-rays, return as needed.  Verruca seborrheica Seborrheic verruca again noted right posterior head, we did initial cryotherapy back in November of last year, he did well, now having a recurrence. Repeat cryotherapy today.    ____________________________________________ Justin Frank, M.D., ABFM., CAQSM., AME. Primary Care and Sports Medicine [facility]  Adjunct Professor of [facility] Medicine  [employer]  Restaurant manager, fast food

## 2023-10-23 ENCOUNTER — Ambulatory Visit (INDEPENDENT_AMBULATORY_CARE_PROVIDER_SITE_OTHER)

## 2023-10-23 DIAGNOSIS — M160 Bilateral primary osteoarthritis of hip: Secondary | ICD-10-CM | POA: Diagnosis not present

## 2023-10-29 ENCOUNTER — Ambulatory Visit: Payer: Self-pay | Admitting: Family Medicine

## 2023-10-29 NOTE — Progress Notes (Signed)
 HI Justin Frank,  The x-ray of your hips does show some progression of the arthritis at the joint of your hips.  They did compare it to an old film from 2020.  Do you feel like the injections have been helpful?  If you are still having some issues we can also consider formal physical therapy.

## 2023-11-02 ENCOUNTER — Encounter: Payer: Self-pay | Admitting: Sports Medicine

## 2023-11-25 ENCOUNTER — Encounter: Payer: Self-pay | Admitting: Medical-Surgical

## 2023-11-25 ENCOUNTER — Ambulatory Visit: Admitting: Medical-Surgical

## 2023-11-25 VITALS — BP 108/65 | HR 68 | Resp 20 | Ht 64.0 in | Wt 137.0 lb

## 2023-11-25 DIAGNOSIS — E559 Vitamin D deficiency, unspecified: Secondary | ICD-10-CM

## 2023-11-25 DIAGNOSIS — J4520 Mild intermittent asthma, uncomplicated: Secondary | ICD-10-CM

## 2023-11-25 DIAGNOSIS — I10 Essential (primary) hypertension: Secondary | ICD-10-CM | POA: Diagnosis not present

## 2023-11-25 DIAGNOSIS — K219 Gastro-esophageal reflux disease without esophagitis: Secondary | ICD-10-CM

## 2023-11-25 DIAGNOSIS — Z23 Encounter for immunization: Secondary | ICD-10-CM | POA: Diagnosis not present

## 2023-11-25 DIAGNOSIS — E785 Hyperlipidemia, unspecified: Secondary | ICD-10-CM

## 2023-11-25 DIAGNOSIS — E1169 Type 2 diabetes mellitus with other specified complication: Secondary | ICD-10-CM | POA: Diagnosis not present

## 2023-11-25 DIAGNOSIS — M7541 Impingement syndrome of right shoulder: Secondary | ICD-10-CM

## 2023-11-25 DIAGNOSIS — M7542 Impingement syndrome of left shoulder: Secondary | ICD-10-CM

## 2023-11-25 DIAGNOSIS — E119 Type 2 diabetes mellitus without complications: Secondary | ICD-10-CM

## 2023-11-25 LAB — POCT GLYCOSYLATED HEMOGLOBIN (HGB A1C)
HbA1c, POC (controlled diabetic range): 5.8 % (ref 0.0–7.0)
Hemoglobin A1C: 5.8 % — AB (ref 4.0–5.6)

## 2023-11-25 LAB — POCT UA - MICROALBUMIN
Creatinine, POC: 50 mg/dL
Microalbumin Ur, POC: 30 mg/L

## 2023-11-25 MED ORDER — CETIRIZINE HCL 10 MG PO TABS: 10.0000 mg | ORAL_TABLET | Freq: Every day | ORAL | 3 refills | Status: AC

## 2023-11-25 NOTE — Progress Notes (Signed)
 Established patient visit   History of Present Illness   Discussed the use of AI scribe software for clinical note transcription with the patient, who gave verbal consent to proceed.  History of Present Illness   Justin Frank is a 68 year old male who presents for follow-up on his shoulder condition and diabetes management.  Glycemic control and diabetes management - Diabetes managed with metformin  1000 mg and Ozempic  1mg  weekly - A1c 6 months ago was 5.7% - Does not check sugars at home - No reported issues with current diabetes regimen  Blood pressure and antihypertensive therapy - Takes amlodipine  10mg  and valsartan  160mg  daily for blood pressure control - Blood pressure readings are on the lower side of normal today - Does not monitor blood pressure at home  Gastroesophageal reflux symptoms - Uses omeprazole  for reflux - No current issues with reflux symptoms  Renal function and microalbuminuria - Urine microalbumin has been abnormal for six years, stable w/o worsening to high abnormal - Renal function remains within normal limits  Physical Exam   Physical Exam Vitals and nursing note reviewed.  Constitutional:      General: He is not in acute distress.    Appearance: Normal appearance. He is not ill-appearing.  HENT:     Head: Normocephalic and atraumatic.  Cardiovascular:     Rate and Rhythm: Normal rate and regular rhythm.     Pulses: Normal pulses.     Heart sounds: Normal heart sounds. No murmur heard.    No friction rub. No gallop.  Pulmonary:     Effort: Pulmonary effort is normal. No respiratory distress.     Breath sounds: Normal breath sounds.  Skin:    General: Skin is warm and dry.  Neurological:     Mental Status: He is alert and oriented to person, place, and time.  Psychiatric:        Mood and Affect: Mood normal.        Behavior: Behavior normal.        Thought Content: Thought content normal.        Judgment: Judgment normal.     Assessment & Plan   Problem List Items Addressed This Visit       Cardiovascular and Mediastinum   Essential hypertension   Blood pressure well-controlled at 108/65 mmHg with the use of Amlodipine  and Valsartan . - Continue Amlodipine  10mg  and Valsartan  160mg  daily.  - Encourage home blood pressure monitoring if considering medication adjustments. - Checking labs today.       Relevant Orders   Lipid panel   CBC with Differential/Platelet   CMP14+EGFR     Respiratory   Mild intermittent asthma without complication   Well managed with no concerns today. Refilling cetirizine  10mg  daily.       Relevant Medications   cetirizine  (ZYRTEC ) 10 MG tablet     Digestive   Gastroesophageal reflux disease   Managed with omeprazole  40mg  daily, effective.  - Continue omeprazole  as prescribed. - Avoid known food triggers.          Endocrine   Controlled type 2 diabetes mellitus without complication, without long-term current use of insulin (HCC) - Primary   Diabetes well-controlled with A1c of 5.8. Chronic microalbuminuria stable for six years, kidney function normal. - Reduce metformin  to 500 mg with breakfast, re-evaluate A1c at next visit. - Continue Ozempic  1mg  weekly. - Continue Valsartan  to help with microalbuminuria. - Continue monitoring kidney function and microalbumin  levels.      Relevant Orders   POCT HgB A1C (Completed)   POCT UA - Microalbumin (Completed)   Hyperlipidemia associated with type 2 diabetes mellitus (HCC)   Managed with Lipitor. - Checking lipids today.  - Continue atorvastatin  40mg  daily.       Relevant Orders   POCT HgB A1C (Completed)   POCT UA - Microalbumin (Completed)     Musculoskeletal and Integument   Impingement syndrome of both shoulders   Previously getting injections at our office with Dr. Curtis but he is no longer at the practice. Due for the next injection soon.  - Referring to Sports Medicine, Dr. Charles.       Relevant Orders   Ambulatory referral to Sports Medicine     Other   Vitamin D  deficiency   Checking vitamin D  today.       Relevant Orders   Vitamin D  (25 hydroxy)   Other Visit Diagnoses       Need for influenza vaccination       Relevant Orders   Flu vaccine HIGH DOSE PF(Fluzone Trivalent) (Completed)      Follow up   Return in about 6 months (around 05/24/2024) for chronic disease follow up. __________________________________ Zada FREDRIK Palin, DNP, APRN, FNP-BC Primary Care and Sports Medicine [facility] [place]

## 2023-11-25 NOTE — Assessment & Plan Note (Signed)
 Previously getting injections at our office with Dr. Curtis but he is no longer at the practice. Due for the next injection soon.  - Referring to Sports Medicine, Dr. Charles.

## 2023-11-25 NOTE — Assessment & Plan Note (Signed)
 Managed with Lipitor. - Checking lipids today.  - Continue atorvastatin  40mg  daily.

## 2023-11-25 NOTE — Assessment & Plan Note (Signed)
 Blood pressure well-controlled at 108/65 mmHg with the use of Amlodipine  and Valsartan . - Continue Amlodipine  10mg  and Valsartan  160mg  daily.  - Encourage home blood pressure monitoring if considering medication adjustments. - Checking labs today.

## 2023-11-25 NOTE — Assessment & Plan Note (Signed)
 Well managed with no concerns today. Refilling cetirizine  10mg  daily.

## 2023-11-25 NOTE — Assessment & Plan Note (Signed)
 Diabetes well-controlled with A1c of 5.8. Chronic microalbuminuria stable for six years, kidney function normal. - Reduce metformin  to 500 mg with breakfast, re-evaluate A1c at next visit. - Continue Ozempic  1mg  weekly. - Continue Valsartan  to help with microalbuminuria. - Continue monitoring kidney function and microalbumin levels.

## 2023-11-25 NOTE — Assessment & Plan Note (Signed)
Checking vitamin-D today.

## 2023-11-25 NOTE — Assessment & Plan Note (Signed)
 Managed with omeprazole  40mg  daily, effective.  - Continue omeprazole  as prescribed. - Avoid known food triggers.

## 2023-11-26 ENCOUNTER — Other Ambulatory Visit: Payer: Self-pay

## 2023-11-26 ENCOUNTER — Ambulatory Visit: Payer: Self-pay | Admitting: Medical-Surgical

## 2023-11-26 DIAGNOSIS — D509 Iron deficiency anemia, unspecified: Secondary | ICD-10-CM

## 2023-11-26 LAB — CBC WITH DIFFERENTIAL/PLATELET
Basophils Absolute: 0 x10E3/uL (ref 0.0–0.2)
Basos: 0 %
EOS (ABSOLUTE): 0 x10E3/uL (ref 0.0–0.4)
Eos: 1 %
Hematocrit: 35.8 % — ABNORMAL LOW (ref 37.5–51.0)
Hemoglobin: 11.4 g/dL — ABNORMAL LOW (ref 13.0–17.7)
Immature Grans (Abs): 0 x10E3/uL (ref 0.0–0.1)
Immature Granulocytes: 0 %
Lymphocytes Absolute: 2.2 x10E3/uL (ref 0.7–3.1)
Lymphs: 38 %
MCH: 24.9 pg — ABNORMAL LOW (ref 26.6–33.0)
MCHC: 31.8 g/dL (ref 31.5–35.7)
MCV: 78 fL — ABNORMAL LOW (ref 79–97)
Monocytes Absolute: 0.4 x10E3/uL (ref 0.1–0.9)
Monocytes: 7 %
Neutrophils Absolute: 3.1 x10E3/uL (ref 1.4–7.0)
Neutrophils: 54 %
Platelets: 223 x10E3/uL (ref 150–450)
RBC: 4.58 x10E6/uL (ref 4.14–5.80)
RDW: 14.4 % (ref 11.6–15.4)
WBC: 5.8 x10E3/uL (ref 3.4–10.8)

## 2023-11-26 LAB — CMP14+EGFR
ALT: 44 IU/L (ref 0–44)
AST: 43 IU/L — ABNORMAL HIGH (ref 0–40)
Albumin: 4.2 g/dL (ref 3.9–4.9)
Alkaline Phosphatase: 78 IU/L (ref 47–123)
BUN/Creatinine Ratio: 15 (ref 10–24)
BUN: 15 mg/dL (ref 8–27)
Bilirubin Total: 0.8 mg/dL (ref 0.0–1.2)
CO2: 23 mmol/L (ref 20–29)
Calcium: 9.2 mg/dL (ref 8.6–10.2)
Chloride: 99 mmol/L (ref 96–106)
Creatinine, Ser: 1.01 mg/dL (ref 0.76–1.27)
Globulin, Total: 2.5 g/dL (ref 1.5–4.5)
Glucose: 72 mg/dL (ref 70–99)
Potassium: 4.2 mmol/L (ref 3.5–5.2)
Sodium: 137 mmol/L (ref 134–144)
Total Protein: 6.7 g/dL (ref 6.0–8.5)
eGFR: 83 mL/min/1.73 (ref >59)

## 2023-11-26 LAB — LIPID PANEL
Chol/HDL Ratio: 1.7 ratio (ref 0.0–5.0)
Cholesterol, Total: 151 mg/dL (ref 100–199)
HDL: 91 mg/dL (ref >39)
LDL Chol Calc (NIH): 51 mg/dL (ref 0–99)
Triglycerides: 37 mg/dL (ref 0–149)
VLDL Cholesterol Cal: 9 mg/dL (ref 5–40)

## 2023-11-26 LAB — VITAMIN D 25 HYDROXY (VIT D DEFICIENCY, FRACTURES): Vit D, 25-Hydroxy: 34.8 ng/mL (ref 30.0–100.0)

## 2023-12-07 LAB — SPECIMEN STATUS REPORT

## 2023-12-07 LAB — TRANSFERRIN SATURATION
IRON SATN MFR SERPL: 17 %{saturation}
IRON SERPL-MCNC: 79 ug/dL
TRANSFERRIN SERPL-MCNC: 337 mg/dL

## 2023-12-07 LAB — IRON,TIBC AND FERRITIN PANEL
Ferritin: 808 ng/mL — ABNORMAL HIGH (ref 30–400)
Iron Saturation: 19 % (ref 15–55)
Iron: 64 ug/dL (ref 38–169)
Total Iron Binding Capacity: 341 ug/dL (ref 250–450)
UIBC: 277 ug/dL (ref 111–343)

## 2023-12-15 ENCOUNTER — Other Ambulatory Visit: Payer: Self-pay | Admitting: Medical-Surgical

## 2023-12-16 ENCOUNTER — Ambulatory Visit

## 2023-12-16 ENCOUNTER — Other Ambulatory Visit (INDEPENDENT_AMBULATORY_CARE_PROVIDER_SITE_OTHER): Payer: Self-pay

## 2023-12-16 VITALS — BP 118/80 | Ht 64.0 in | Wt 137.0 lb

## 2023-12-16 DIAGNOSIS — M7542 Impingement syndrome of left shoulder: Secondary | ICD-10-CM | POA: Diagnosis not present

## 2023-12-16 DIAGNOSIS — M7541 Impingement syndrome of right shoulder: Secondary | ICD-10-CM

## 2023-12-16 MED ORDER — METHYLPREDNISOLONE ACETATE 40 MG/ML IJ SUSP
40.0000 mg | Freq: Once | INTRAMUSCULAR | Status: AC
  Administered 2023-12-16: 40 mg via INTRA_ARTICULAR

## 2023-12-16 NOTE — Progress Notes (Signed)
 Subjective:    Patient ID: Justin Frank, male    DOB: 68 y.o., 10-24-1955   MRN: [id number]  Chief Complaint: Bilateral shoulder pain  Discussed the use of AI scribe software for clinical note transcription with the patient, who gave verbal consent to proceed.  History of Present Illness  Justin Frank is a 68 year old male with bilateral shoulder impingement syndrome who presents for follow-up after subacromial injections.  Shoulder pain and impingement symptoms - Bilateral shoulder pain with primary discomfort in the right shoulder and minor symptoms in the left shoulder - Pain characterized as throbbing in both shoulders, recurring after the effects of subacromial injections wore off - Pain is aggravated by lifting arms and reaching overhead, especially on the right side - No numbness or tingling in the arms  Response to prior interventions - Received subacromial injections in both shoulders, most recently on Jul 29, 2023 - Injections provided relief for three to four months before pain recurred - Resumed recommended shoulder exercises after reducing them when pain subsided  Imaging and surgical history - MRI of the right shoulder in December showed minor arthritis, small rotator cuff tears, and biceps tendinosis - History of right shoulder surgery approximately 15-17 years ago, which resolved previous issues until recent recurrence of symptoms  Functional impact and occupational exacerbation - Works as an Retail buyer at [employer], 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 - Occupational demands may exacerbate shoulder symptoms - Right-hand dominant      Objective:   Vitals:   12/16/23 1019  BP: 118/80    Left shoulder ( compared to normal ) Inspection: - swelling, - scapular dyskinesis Palpation: TTP - greater tuberosity, - AC joint, - biceps tendon, - posterior shoulder AROM/PROM: 170 forward flexion, 170  abduction, 50 external rotation, internal rotation to lumbar spine Strength: 5/5 lift off, 5/5 empty can, 5/5 external rotation, 5/5 flexion, - drop arm test Special tests:    -Rotator Cuff: + Neer's, - Hawkin's, - empty can, + painful arc   -Labrum: - O'brien's, - Jerk   -Biceps: - speed's, - yergason's    -AC Joint: - cross arm testing     -Instability: - external rotation/apprehension/relocation test, - sulcus sign   Right shoulder ( compared to normal ) Inspection: - swelling, - scapular dyskinesis Palpation: TTP - greater tuberosity, - AC joint, + biceps tendon, - posterior shoulder AROM/PROM: 170 forward flexion, 170 abduction, 45 external rotation, internal rotation to thoracic spine Strength: 5/5 lift off, 5/5 empty can, 5/5 external rotation, 5/5 flexion, - drop arm test Special tests:    -Rotator Cuff: + Neer's, ++ Hawkin's, - empty can, + painful arc   -Labrum: + O'brien's, - Jerk   -Biceps: + speed's, - yergason's    -AC Joint: - cross arm testing     -Instability: - external rotation/apprehension/relocation test, - sulcus sign   Subacromial Space Injection with Ultrasound Guidance Procedure Note CORTEZ FLIPPEN May 22, 1955 Indications: Pain Procedure Details Verbal consent was obtained from the patient. Risks, benefits, and alternatives were explained. The left subacromial space and subacromial/subdeltoid bursa was identified on US . Patient prepped with Chloraprep and Ethyl Chloride used for anesthesia. The patient was then injected using a lateral approach with a solution of 40mg  Depo-Medrol , 3cc Mepivacaine 2%, 0.5cc Sodium Bicarbonate 8.4%. The patient tolerated the procedure well and had decreased pain post injection. No complications. Please see applicable images in the medical record.   Subacromial Space Injection with Ultrasound  Guidance Procedure Note Justin Frank 08/02/1955 Indications: Pain Procedure Details Verbal consent was obtained from the patient.  Risks, benefits, and alternatives were explained. The right subacromial space and subacromial/subdeltoid bursa was identified on US . Patient prepped with Chloraprep and Ethyl Chloride used for anesthesia. The patient was then injected using a lateral approach with a solution of 40mg  Depo-Medrol , 3cc Mepivacaine 2%, 0.5cc Sodium Bicarbonate 8.4%. The patient tolerated the procedure well and had decreased pain post injection. No complications. Please see applicable images in the medical record.     Assessment & Plan:   Assessment & Plan Bilateral shoulder impingement syndrome with right rotator cuff tear, biceps tendinosis, and glenohumeral osteoarthritis   Chronic bilateral shoulder impingement syndrome presents with more significant symptoms on the right. Previous MRI showed minor rotator cuff tears, biceps tendinosis, and glenohumeral osteoarthritis on the right. Subacromial injections in May 2025 provided 3-4 months of relief. He now experiences increased pain and throbbing in both shoulders, more on the right, likely exacerbated by his work as an Retail buyer. No numbness or tingling is reported. Physical examination shows pain with certain movements, especially on the right, consistent with impingement syndrome. No new imaging is needed unless symptoms significantly worsen. Administer subacromial injections in both shoulders. Encourage continuation of home exercise program to maintain shoulder function. Discuss potential for future imaging if symptoms worsen. Consider formal physical therapy referral if needed.

## 2023-12-16 NOTE — Addendum Note (Signed)
 Addended by: MARCINE HARLENE SAILOR on: 12/16/2023 11:06 AM   Modules accepted: Orders

## 2023-12-19 ENCOUNTER — Other Ambulatory Visit: Payer: Self-pay | Admitting: Medical-Surgical

## 2023-12-29 ENCOUNTER — Other Ambulatory Visit: Payer: Self-pay

## 2023-12-29 MED ORDER — IPRATROPIUM BROMIDE 0.03 % NA SOLN: NASAL | 5 refills | Status: AC

## 2024-01-07 ENCOUNTER — Other Ambulatory Visit: Payer: Self-pay

## 2024-01-07 DIAGNOSIS — E119 Type 2 diabetes mellitus without complications: Secondary | ICD-10-CM

## 2024-01-07 MED ORDER — METFORMIN HCL ER 500 MG PO TB24: 500.0000 mg | ORAL_TABLET | Freq: Two times a day (BID) | ORAL | 0 refills | Status: AC

## 2024-03-10 LAB — OPHTHALMOLOGY REPORT-SCANNED

## 2024-05-25 ENCOUNTER — Ambulatory Visit: Admitting: Medical-Surgical
